# Patient Record
Sex: Male | Born: 1950 | Race: White | Hispanic: No | Marital: Single | Smoking: Never smoker
Health system: Southern US, Community
[De-identification: ages and names within clinical notes are randomized; demographics above are authoritative.]

## PROBLEM LIST (undated history)

## (undated) DIAGNOSIS — M62838 Other muscle spasm: Secondary | ICD-10-CM

## (undated) DIAGNOSIS — G609 Hereditary and idiopathic neuropathy, unspecified: Secondary | ICD-10-CM

## (undated) DIAGNOSIS — F419 Anxiety disorder, unspecified: Secondary | ICD-10-CM

## (undated) DIAGNOSIS — K219 Gastro-esophageal reflux disease without esophagitis: Secondary | ICD-10-CM

## (undated) DIAGNOSIS — B20 Human immunodeficiency virus [HIV] disease: Secondary | ICD-10-CM

## (undated) DIAGNOSIS — D649 Anemia, unspecified: Secondary | ICD-10-CM

## (undated) DIAGNOSIS — F0280 Dementia in other diseases classified elsewhere without behavioral disturbance: Secondary | ICD-10-CM

## (undated) DIAGNOSIS — F32A Depression, unspecified: Secondary | ICD-10-CM

## (undated) DIAGNOSIS — F0390 Unspecified dementia without behavioral disturbance: Secondary | ICD-10-CM

## (undated) DIAGNOSIS — I10 Essential (primary) hypertension: Secondary | ICD-10-CM

## (undated) DIAGNOSIS — F329 Major depressive disorder, single episode, unspecified: Secondary | ICD-10-CM

## (undated) DIAGNOSIS — D696 Thrombocytopenia, unspecified: Secondary | ICD-10-CM

## (undated) DIAGNOSIS — G969 Disorder of central nervous system, unspecified: Secondary | ICD-10-CM

## (undated) DIAGNOSIS — T7840XA Allergy, unspecified, initial encounter: Secondary | ICD-10-CM

## (undated) DIAGNOSIS — E881 Lipodystrophy, not elsewhere classified: Secondary | ICD-10-CM

## (undated) DIAGNOSIS — E785 Hyperlipidemia, unspecified: Secondary | ICD-10-CM

## (undated) DIAGNOSIS — M1990 Unspecified osteoarthritis, unspecified site: Secondary | ICD-10-CM

## (undated) DIAGNOSIS — R634 Abnormal weight loss: Principal | ICD-10-CM

## (undated) DIAGNOSIS — R2689 Other abnormalities of gait and mobility: Secondary | ICD-10-CM

## (undated) DIAGNOSIS — K859 Acute pancreatitis without necrosis or infection, unspecified: Secondary | ICD-10-CM

## (undated) HISTORY — DX: Other abnormalities of gait and mobility: R26.89

## (undated) HISTORY — DX: Hereditary and idiopathic neuropathy, unspecified: G60.9

## (undated) HISTORY — DX: Hyperlipidemia, unspecified: E78.5

## (undated) HISTORY — DX: Human immunodeficiency virus (HIV) disease: B20

## (undated) HISTORY — DX: Allergy, unspecified, initial encounter: T78.40XA

## (undated) HISTORY — DX: Anemia, unspecified: D64.9

## (undated) HISTORY — DX: Major depressive disorder, single episode, unspecified: F32.9

## (undated) HISTORY — DX: Disorder of central nervous system, unspecified: G96.9

## (undated) HISTORY — DX: Anxiety disorder, unspecified: F41.9

## (undated) HISTORY — DX: Thrombocytopenia, unspecified: D69.6

## (undated) HISTORY — DX: Lipodystrophy, not elsewhere classified: E88.1

## (undated) HISTORY — DX: Dementia in other diseases classified elsewhere without behavioral disturbance: F02.80

## (undated) HISTORY — DX: Unspecified dementia, unspecified severity, without behavioral disturbance, psychotic disturbance, mood disturbance, and anxiety: F03.90

## (undated) HISTORY — DX: Abnormal weight loss: R63.4

## (undated) HISTORY — DX: Essential (primary) hypertension: I10

## (undated) HISTORY — DX: Gastro-esophageal reflux disease without esophagitis: K21.9

## (undated) HISTORY — DX: Other muscle spasm: M62.838

## (undated) HISTORY — DX: Depression, unspecified: F32.A

## (undated) HISTORY — DX: Unspecified osteoarthritis, unspecified site: M19.90

## (undated) HISTORY — DX: Acute pancreatitis without necrosis or infection, unspecified: K85.90

---

## 2004-08-21 ENCOUNTER — Ambulatory Visit (HOSPITAL_COMMUNITY): Admission: RE | Admit: 2004-08-21 | Discharge: 2004-08-21 | Payer: Self-pay

## 2010-07-09 ENCOUNTER — Encounter: Payer: Self-pay | Admitting: Infectious Disease

## 2010-07-18 ENCOUNTER — Encounter: Payer: Self-pay | Admitting: Infectious Disease

## 2010-07-26 ENCOUNTER — Encounter: Payer: Self-pay | Admitting: Infectious Disease

## 2010-08-14 ENCOUNTER — Telehealth: Payer: Self-pay | Admitting: Infectious Disease

## 2010-08-26 ENCOUNTER — Ambulatory Visit: Payer: Self-pay | Admitting: Infectious Disease

## 2010-08-26 ENCOUNTER — Telehealth: Payer: Self-pay | Admitting: Infectious Disease

## 2010-08-26 DIAGNOSIS — E291 Testicular hypofunction: Secondary | ICD-10-CM

## 2010-08-26 DIAGNOSIS — R5383 Other fatigue: Secondary | ICD-10-CM

## 2010-08-26 DIAGNOSIS — G609 Hereditary and idiopathic neuropathy, unspecified: Secondary | ICD-10-CM | POA: Insufficient documentation

## 2010-08-26 DIAGNOSIS — F0280 Dementia in other diseases classified elsewhere without behavioral disturbance: Secondary | ICD-10-CM

## 2010-08-26 DIAGNOSIS — N62 Hypertrophy of breast: Secondary | ICD-10-CM | POA: Insufficient documentation

## 2010-08-26 DIAGNOSIS — K8689 Other specified diseases of pancreas: Secondary | ICD-10-CM

## 2010-08-26 DIAGNOSIS — B20 Human immunodeficiency virus [HIV] disease: Secondary | ICD-10-CM

## 2010-08-26 DIAGNOSIS — R5381 Other malaise: Secondary | ICD-10-CM | POA: Insufficient documentation

## 2010-08-26 LAB — CONVERTED CEMR LAB
ALT: 39 units/L (ref 0–53)
AST: 32 units/L (ref 0–37)
Albumin: 4.4 g/dL (ref 3.5–5.2)
Alkaline Phosphatase: 78 units/L (ref 39–117)
BUN: 11 mg/dL (ref 6–23)
Basophils Absolute: 0 10*3/uL (ref 0.0–0.1)
Basophils Relative: 0 % (ref 0–1)
CO2: 27 meq/L (ref 19–32)
Calcium: 9.4 mg/dL (ref 8.4–10.5)
Chlamydia, Swab/Urine, PCR: NEGATIVE
Chloride: 107 meq/L (ref 96–112)
Cholesterol: 147 mg/dL (ref 0–200)
Creatinine, Ser: 1.19 mg/dL (ref 0.40–1.50)
Eosinophils Absolute: 0.1 10*3/uL (ref 0.0–0.7)
Eosinophils Relative: 2 % (ref 0–5)
GC Probe Amp, Urine: NEGATIVE
Glucose, Bld: 90 mg/dL (ref 70–99)
HCT: 42.3 % (ref 39.0–52.0)
HDL: 41 mg/dL (ref >39)
HIV 1 RNA Quant: <20 copies/mL (ref <20)
HIV-1 RNA Quant, Log: <1.3 (ref <1.30)
Hemoglobin: 14.8 g/dL (ref 13.0–17.0)
LDL Cholesterol: 91 mg/dL (ref 0–99)
Lymphocytes Relative: 20 % (ref 12–46)
Lymphs Abs: 1.2 10*3/uL (ref 0.7–4.0)
MCHC: 35 g/dL (ref 30.0–36.0)
MCV: 84.1 fL (ref 78.0–100.0)
Monocytes Absolute: 0.3 10*3/uL (ref 0.1–1.0)
Monocytes Relative: 4 % (ref 3–12)
Neutro Abs: 4.4 10*3/uL (ref 1.7–7.7)
Neutrophils Relative %: 74 % (ref 43–77)
Platelets: 78 10*3/uL — ABNORMAL LOW (ref 150–400)
Potassium: 4.3 meq/L (ref 3.5–5.3)
RBC: 5.03 M/uL (ref 4.22–5.81)
RDW: 14.2 % (ref 11.5–15.5)
Sodium: 142 meq/L (ref 135–145)
TSH: 1.368 microintl units/mL (ref 0.350–4.500)
Testosterone: 660.1 ng/dL (ref 250–890)
Total Bilirubin: 0.7 mg/dL (ref 0.3–1.2)
Total CHOL/HDL Ratio: 3.6
Total Protein: 7 g/dL (ref 6.0–8.3)
Triglycerides: 77 mg/dL (ref <150)
VLDL: 15 mg/dL (ref 0–40)
WBC: 5.9 10*3/uL (ref 4.0–10.5)

## 2010-08-30 ENCOUNTER — Encounter: Payer: Self-pay | Admitting: Infectious Disease

## 2010-09-04 ENCOUNTER — Encounter (INDEPENDENT_AMBULATORY_CARE_PROVIDER_SITE_OTHER): Payer: Self-pay | Admitting: *Deleted

## 2010-09-27 ENCOUNTER — Encounter: Payer: Self-pay | Admitting: Infectious Disease

## 2010-10-22 NOTE — Miscellaneous (Signed)
Summary: HIPAA Restrictions  HIPAA Restrictions   Imported By: Florinda Marker 08/26/2010 14:22:24  _____________________________________________________________________  External Attachment:    Type:   Image     Comment:   External Document

## 2010-10-22 NOTE — Assessment & Plan Note (Signed)
Summary: new pt transferring from [facility]/pre dr Daiva Eves Marilynn Latino   Visit Type:  Consult Referring Provider:  Dr. Hyacinth Meeker   History of Present Illness: 60 year old man with HIV, AIDS diagnosed in the 1990s apparently with NRTI induced pancreatitis, also with hypogonadism, peripheral neuropathy, insominia, fatigue and HIV dementia it appears. He has currently been doing well on nevirapene and truvada and had wished to transfer care from [facility] to [place]. He was followed kby Dr. Hyacinth Meeker at [facility]. I have received large stack of records which [place] reviewed from [place]. Pt feels well apart from his chronic fatigue and malaise. He continues to have diarrhea though well controlled with antidiarheal and claims to take his creon 2 tabs three times a day with  I spent greater than an hour with pt including greater than 50% of time face to face counselling pt and in coordination of care.Dr. Hyacinth Meeker  Problems Prior to Update: None  Medications Prior to Update: 1)  Zaleplon 10 Mg Caps (Zaleplon) .... Take 1 To Two Tablets  By Mouth At Bedtime As Needed Insomnia  Current Medications (verified): 1)  Zaleplon 10 Mg Caps (Zaleplon) .... Take 1 To Two Tablets  By Mouth At Bedtime As Needed Insomnia 2)  Truvada 200-300 Mg Tabs (Emtricitabine-Tenofovir) .... Take 1 Tablet By Mouth Once A Day 3)  Viramune Xr 400 Mg Xr24h-Tab (Nevirapine) .... Take 1 Tablet By Mouth Once A Day 4)  Amitriptyline Hcl 50 Mg Tabs (Amitriptyline Hcl) .... Take 1 Tab By Mouth At Bedtime 5)  Creon 24000 Unit Cpep (Pancrelipase (Lip-Prot-Amyl)) .... Take Two Tablets With Each Meal 6)  Lomotil 2.5-0.025 Mg Tabs (Diphenoxylate-Atropine) .... Up To Two Times A Day As Needed Diarrhea 7)  Prilosec 40 Mg Cpdr (Omeprazole) .... Take 1 Tablet By Mouth Once A Day 8)  Atenolol 50 Mg Tabs (Atenolol) .... Take 1 Tablet By Mouth Once A Day 9)  Promethazine Hcl 25 Mg Tabs (Promethazine Hcl) .... Take 1 Tablet By Mouth Two Times A Day As Needed  Nausea   Past History:  Past Medical History: HIV HIV associated pancreatitis  chronic diarrhea pancreatic insuficiency GERD HTN INsomnia Fatigued Depressed Hepatitis B HIV dementia hypogonadism  Past Surgical History: none  Family History: Reviewed history and no changes required. Mom with heart problems in 26s, congenital heart defect, Dad died in 64s from MI  Social History: Reviewed history and no changes required. disablity, lives with one partner (HIV negative) , no smoker, no alcohol, no drugs  Review of Systems       The patient complains of muscle weakness and depression.  The patient denies anorexia, fever, weight loss, weight gain, vision loss, decreased hearing, hoarseness, chest pain, syncope, dyspnea on exertion, peripheral edema, prolonged cough, headaches, hemoptysis, abdominal pain, melena, hematochezia, severe indigestion/heartburn, hematuria, incontinence, genital sores, suspicious skin lesions, transient blindness, difficulty walking, unusual weight change, abnormal bleeding, and enlarged lymph nodes.    Physical Exam  General:  alert, underweight appearing, and pale.   Head:  normocephalic.  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   Ears:  no external deformities.   Nose:  no external deformity.   Mouth:  pharynx pink and moist, no erythema, and no exudates.   Neck:  supple and full ROM.   Breasts:  gynecomastia.   Lungs:  normal respiratory effort, no crackles, and no wheezes.   Heart:  normal rate, regular rhythm, no murmur, no gallop,  and no rub.   Abdomen:  soft, non-tender, normal bowel sounds, and no distention.   Msk:  normal ROM and no joint swelling.   Neurologic:  alert & oriented X3, strength normal in all extremities, and gait normal.   Skin:  turgor normal and no rashes.   Psych:  normally interactive and good eye contact.     Impression &  Recommendations:  Problem # 1:  AIDS (ICD-042) Change him to long acting viramune with truvada. Check labs today. Update immunizations in our records from [facility] records Orders: T-CBC w/Diff [phone]) Consultation Level V [phone])  Problem # 2:  FATIGUE (ICD-780.79) recheck his testosterone level and tfts Orders: T-Testosterone; Total [id number]) T-TSH [id number]) Consultation Level V [id number])  Problem # 3:  PERIPHERAL NEUROPATHY (ICD-356.9)  stable  Orders: Consultation Level V (44010)  Problem # 4:  HYPOGONADISM (ICD-257.2)  recheck testosterone. Not clear to me why he had this stopped  Orders: Consultation Level V (27253)  Problem # 5:  PANCREATIC INSUFFICIENCY (ICD-577.8)  filled his creon  Orders: Consultation Level V (66440)  Medications Added to Medication List This Visit: 1)  Truvada 200-300 Mg Tabs (Emtricitabine-tenofovir) .... Take 1 tablet by mouth once a day 2)  Viramune Xr 400 Mg Xr24h-tab (Nevirapine) .... Take 1 tablet by mouth once a day 3)  Amitriptyline Hcl 50 Mg Tabs (Amitriptyline hcl) .... Take 1 tab by mouth at bedtime 4)  Creon 24000 Unit Cpep (Pancrelipase (lip-prot-amyl)) .... Take two tablets with each meal 5)  Lomotil 2.5-0.025 Mg Tabs (Diphenoxylate-atropine) .... Up to two times a day as needed diarrhea 6)  Prilosec 40 Mg Cpdr (Omeprazole) .... Take 1 tablet by mouth once a day 7)  Atenolol 50 Mg Tabs (Atenolol) .... Take 1 tablet by mouth once a day 8)  Promethazine Hcl 25 Mg Tabs (Promethazine hcl) .... Take 1 tablet by mouth two times a day as needed nausea  Other Orders: T-RPR (Syphilis) [id number]) T-GC Probe, urine [id number]) T-Chlamydia  Probe, urine [phone]) T-CD4SP [facility]) (CD4SP) T-HIV Viral Load [id number]) T-RPR (Syphilis) [id number]) T-GC Probe, urine [id number]) T-Chlamydia  Probe, urine [id number]) T-Comprehensive Metabolic Panel [id number]) T-Lipid Profile ([id number]) Future  Orders: T-CD4SP ([facility]) (CD4SP) ... 11/24/2010 T-HIV Viral Load [id number]) ... 11/24/2010 T-CBC w/Diff ([id number]) ... 11/24/2010 T-Comprehensive Metabolic Panel [id number]) ... 11/24/2010       Medication Adherence: 08/26/2010   Adherence to medications reviewed with patient. Counseling to provide adequate adherence provided   Prevention For Positives: 08/26/2010   Safe sex practices discussed with patient. Condoms offered.   Education Materials Provided: 08/26/2010 Safe sex practices discussed with patient. Condoms offered.                            Patient Instructions: 1)  rtc for lab  check in 2-3 months for labs and  appt with Dr. Daiva Eves 2)  Be sure to return for lab work one (1) week before your next appointment as scheduled. Prescriptions: ATENOLOL 50 MG TABS (ATENOLOL) Take 1 tablet by mouth once a day  #30 x 11   Entered by:   Starleen Arms CMA   Authorized by:   Acey Lav MD   Signed by:   Starleen Arms CMA on 08/26/2010   Method used:   Printed then faxed to ...       [facility] [phone]* (retail)       [address]       [place], [place]  [address]       Ph: [phone]       Fax:    RxID:   [id number] PRILOSEC 40 MG CPDR (OMEPRAZOLE) Take 1 tablet by mouth once a day  #30 x 11   Entered by:   Starleen Arms CMA   Authorized by:   Acey Lav MD   Signed by:   Starleen Arms CMA on 08/26/2010   Method used:   Printed then faxed to ...       [facility] [phone]* (retail)       [address]       [place], [place]  [address]       Ph: [phone]       Fax:    RxID:   [id number] [phone] LOMOTIL 2.5-0.025 MG TABS (DIPHENOXYLATE-ATROPINE) up to two times a day as needed diarrhea  #60 x 11   Entered by:   Starleen Arms CMA   Authorized by:   Acey Lav MD   Signed by:   Starleen Arms CMA on 08/26/2010   Method used:   Printed then faxed to ...       [facility] [phone]* (retail)       [address]        [place], [place]  [address]       Ph: [phone]       Fax:    RxID:   [phone] CREON 24000 UNIT CPEP (PANCRELIPASE (LIP-PROT-AMYL)) take two tablets with each meal  #60 x 11   Entered by:   Starleen Arms CMA   Authorized by:   Acey Lav MD   Signed by:   Starleen Arms CMA on 08/26/2010   Method used:   Printed then faxed to ...       [facility] [phone]* (retail)       [address]       [place], [place]  [address]       Ph: [phone]       Fax:    RxID:   [id number] AMITRIPTYLINE HCL 50 MG TABS (AMITRIPTYLINE HCL) Take 1 tab by mouth at bedtime  #30 x 11   Entered by:   Starleen Arms CMA   Authorized by:   Acey Lav MD   Signed by:   Starleen Arms CMA on 08/26/2010   Method used:   Printed then faxed to ...       [facility] [phone]* (retail)       [address]       [place], [place]  [address]       Ph: [phone]       Fax:    RxID:   [id number] PROMETHAZINE HCL 25 MG TABS (PROMETHAZINE HCL) Take 1 tablet by mouth two times a day as needed nausea  #60 x 3   Entered by:   Starleen Arms CMA   Authorized by:   Acey Lav MD   Signed by:   Starleen Arms CMA on 08/26/2010   Method used:   Printed then faxed to ...       [facility] [phone]* (retail)       [address]       [place], [place]  [address]       Ph: [phone]       Fax:    RxID:   [id number] VIRAMUNE XR 400 MG XR24H-TAB (NEVIRAPINE) Take 1 tablet by mouth once a day  #30 x 11   Entered by:   Starleen Arms CMA  Authorized by:   Acey Lav MD   Signed by:   Starleen Arms CMA on 08/26/2010   Method used:   Printed then faxed to ...       [facility] [phone]* (retail)       [address]       [place], [place]  [address]       Ph: [phone]       Fax:    RxID:   [id number] TRUVADA 200-300 MG TABS (EMTRICITABINE-TENOFOVIR) Take 1 tablet by mouth once a day  #30 x 11   Entered by:   Starleen Arms CMA   Authorized by:   Acey Lav MD   Signed  by:   Starleen Arms CMA on 08/26/2010   Method used:   Printed then faxed to ...       [facility] [phone]* (retail)       [address]       [place], [place]  [address]       Ph: [phone]       Fax:    RxID:   [phone] ZALEPLON 10 MG CAPS (ZALEPLON) Take 1 to two tablets  by mouth at bedtime as needed insomnia  #60 x 6   Entered by:   Starleen Arms CMA   Authorized by:   Acey Lav MD   Signed by:   Starleen Arms CMA on 08/26/2010   Method used:   Telephoned to ...       [facility] [address] [phone]* (retail)       [address] [place]  [address]       Ph: [phone]       Fax: [phone]   RxID:   [id number]

## 2010-10-22 NOTE — Consult Note (Signed)
Summary: [facility]: ID 09/19/98-2004  [facility]: ID 09/19/98-2004   Imported By: Florinda Marker 08/30/2010 14:12:19  _____________________________________________________________________  External Attachment:    Type:   Image     Comment:   External Document

## 2010-10-22 NOTE — Progress Notes (Signed)
  Phone Note Call from Patient   Caller: Patient    New/Updated Medications: ZALEPLON 10 MG CAPS (ZALEPLON) Take 1 to two tablets  by mouth at bedtime as needed insomnia Prescriptions: ZALEPLON 10 MG CAPS (ZALEPLON) Take 1 to two tablets  by mouth at bedtime as needed insomnia  #60 x 0   Entered and Authorized by:   Acey Lav MD   Signed by:   Paulette Blanch Dam MD on 08/14/2010   Method used:   Printed then faxed to ...       [facility] [address] [phone]* (retail)       [address] [place], [place]  [address]       Ph: [phone]       Fax: [phone]   RxID:   [id number]

## 2010-10-22 NOTE — Consult Note (Signed)
Summary: [facility]: 08/27/2007  [facility]: 08/27/2007   Imported By: Florinda Marker 07/26/2010 11:34:44  _____________________________________________________________________  External Attachment:    Type:   Image     Comment:   External Document

## 2010-10-22 NOTE — Consult Note (Signed)
Summary: [facility]: ID Notes 4/11 to 11/10  [facility]: ID Notes 4/11 to 11/10   Imported By: Florinda Marker 07/18/2010 11:53:11  _____________________________________________________________________  External Attachment:    Type:   Image     Comment:   External Document

## 2010-10-24 NOTE — Progress Notes (Signed)
Summary: Questions re: Creon  Phone Note From Pharmacy   Caller: [facility]  Summary of Call: Pharmacy called about quantitiy of Creon. It was sent as #60. The directions suggest # 180 for 30 day supply.   Follow-up for Phone Call        I will send corrected dose to pharmacy . Tomasita Morrow RN  August 26, 2010 3:54 PM  Follow-up by: Tomasita Morrow RN,  August 26, 2010 3:55 PM  Additional Follow-up for Phone Call Additional follow up Details #1::        T hanks Tammy Additional Follow-up by: Acey Lav MD,  August 27, 2010 8:16 AM    Prescriptions: CREON 24000 UNIT CPEP (PANCRELIPASE (LIP-PROT-AMYL)) take two tablets with each meal  #180 x 11   Entered by:   Tomasita Morrow RN   Authorized by:   Acey Lav MD   Signed by:   Tomasita Morrow RN on 08/26/2010   Method used:   Electronically to        [facility] [phone]* (retail)       [address]       [place], [place]  [address]       Ph: [phone]       Fax:    RxID:   [id number]

## 2010-10-24 NOTE — Miscellaneous (Signed)
Summary: RW Update  Clinical Lists Changes  Observations: Added new observation of RWPARTICIP: Yes (09/27/2010 10:08)

## 2010-10-24 NOTE — Miscellaneous (Signed)
Summary: problem list update  Clinical Lists Changes  Problems: Added new problem of SCREENING EXAMINATION FOR VENEREAL DISEASE (ICD-V74.5)

## 2010-10-30 ENCOUNTER — Encounter (INDEPENDENT_AMBULATORY_CARE_PROVIDER_SITE_OTHER): Payer: Self-pay | Admitting: *Deleted

## 2010-10-31 ENCOUNTER — Encounter (INDEPENDENT_AMBULATORY_CARE_PROVIDER_SITE_OTHER): Payer: Self-pay | Admitting: *Deleted

## 2010-11-06 ENCOUNTER — Encounter (INDEPENDENT_AMBULATORY_CARE_PROVIDER_SITE_OTHER): Payer: Self-pay | Admitting: *Deleted

## 2010-11-07 NOTE — Miscellaneous (Signed)
  Clinical Lists Changes 

## 2010-11-07 NOTE — Miscellaneous (Signed)
  Clinical Lists Changes  Observations: Added new observation of INFECTDIS MD: [place] (10/30/2010 9:41) 

## 2010-11-07 NOTE — Miscellaneous (Signed)
  Clinical Lists Changes  Observations: Added new observation of PAYOR: Private (10/31/2010 11:50) Added new observation of LATINO/HISP: No (10/31/2010 11:50) Added new observation of RACE: White (10/31/2010 11:50)

## 2010-11-11 ENCOUNTER — Other Ambulatory Visit: Payer: Self-pay

## 2010-11-12 ENCOUNTER — Other Ambulatory Visit (INDEPENDENT_AMBULATORY_CARE_PROVIDER_SITE_OTHER): Payer: Medicare Other

## 2010-11-12 ENCOUNTER — Other Ambulatory Visit: Payer: Self-pay | Admitting: Infectious Disease

## 2010-11-12 ENCOUNTER — Encounter: Payer: Self-pay | Admitting: Infectious Disease

## 2010-11-12 DIAGNOSIS — B20 Human immunodeficiency virus [HIV] disease: Secondary | ICD-10-CM

## 2010-11-12 LAB — CONVERTED CEMR LAB
AST: 23 units/L (ref 0–37)
Albumin: 4.7 g/dL (ref 3.5–5.2)
Alkaline Phosphatase: 75 units/L (ref 39–117)
BUN: 10 mg/dL (ref 6–23)
Basophils Absolute: 0 10*3/uL (ref 0.0–0.1)
Basophils Relative: 0 % (ref 0–1)
CO2: 26 meq/L (ref 19–32)
Calcium: 9.4 mg/dL (ref 8.4–10.5)
Chloride: 105 meq/L (ref 96–112)
Creatinine, Ser: 1.03 mg/dL (ref 0.40–1.50)
Eosinophils Absolute: 0.1 10*3/uL (ref 0.0–0.7)
Eosinophils Relative: 2 % (ref 0–5)
Glucose, Bld: 107 mg/dL — ABNORMAL HIGH (ref 70–99)
HCT: 41.8 % (ref 39.0–52.0)
HIV 1 RNA Quant: <20 copies/mL (ref <20)
Hemoglobin: 15.3 g/dL (ref 13.0–17.0)
Lymphs Abs: 1 10*3/uL (ref 0.7–4.0)
MCHC: 36.6 g/dL — ABNORMAL HIGH (ref 30.0–36.0)
MCV: 84.1 fL (ref 78.0–100.0)
Monocytes Absolute: 0.3 10*3/uL (ref 0.1–1.0)
Monocytes Relative: 6 % (ref 3–12)
Neutro Abs: 3.3 10*3/uL (ref 1.7–7.7)
Platelets: 63 10*3/uL — ABNORMAL LOW (ref 150–400)
Potassium: 3.9 meq/L (ref 3.5–5.3)
RBC: 4.97 M/uL (ref 4.22–5.81)
RDW: 13.5 % (ref 11.5–15.5)
Sodium: 139 meq/L (ref 135–145)
Total Bilirubin: 0.7 mg/dL (ref 0.3–1.2)
Total Protein: 7.3 g/dL (ref 6.0–8.3)
WBC: 4.7 10*3/uL (ref 4.0–10.5)

## 2010-11-13 LAB — T-HELPER CELL (CD4) - (RCID CLINIC ONLY)
CD4 % Helper T Cell: 29 % — ABNORMAL LOW (ref 33–55)
CD4 T Cell Abs: 320 uL — ABNORMAL LOW (ref 400–2700)

## 2010-11-13 NOTE — Miscellaneous (Signed)
Summary: RW update  Clinical Lists Changes  Observations: Added new observation of GENDER: Male (11/06/2010 14:32)

## 2010-11-14 ENCOUNTER — Telehealth: Payer: Self-pay | Admitting: Infectious Disease

## 2010-11-20 ENCOUNTER — Encounter (INDEPENDENT_AMBULATORY_CARE_PROVIDER_SITE_OTHER): Payer: Self-pay | Admitting: *Deleted

## 2010-11-21 ENCOUNTER — Encounter (INDEPENDENT_AMBULATORY_CARE_PROVIDER_SITE_OTHER): Payer: Self-pay | Admitting: *Deleted

## 2010-11-26 ENCOUNTER — Telehealth: Payer: Self-pay | Admitting: *Deleted

## 2010-11-26 ENCOUNTER — Encounter (INDEPENDENT_AMBULATORY_CARE_PROVIDER_SITE_OTHER): Payer: Self-pay | Admitting: *Deleted

## 2010-11-28 NOTE — Progress Notes (Addendum)
Summary: Patient needs flu shot and pneumovax updated  Phone Note Outgoing Call   Call placed by: Acey Lav MD,  November 14, 2010 4:18 PM Summary of Call: Per our records patient needs flu and pneumovax. He likely had pneumovax at [facility]. We just need to document date on that one. For flu  Please call and have come in for flu shot and if pt refuses flu shot  please document in chart Initial call taken by: Acey Lav MD,  November 14, 2010 4:18 PM  Follow-up for Phone Call        left message for patient to call me back Follow-up by: Starleen Arms CMA,  November 15, 2010 9:53 AM  Additional Follow-up for Phone Call Additional follow up Details #1::        states he got the flu shot at [facility] on 07/13/10 Additional Follow-up by: Golden Circle RN,  November 15, 2010 12:26 PM      Immunization History:  Influenza Immunization History:    Influenza:  historical (07/13/2010)  Pneumonia Vaccine (to be given today)

## 2010-11-28 NOTE — Miscellaneous (Signed)
  Clinical Lists Changes  Observations: Added new observation of AIDSDAP: PENDING 2012 APPROVAL (11/20/2010 16:07) Added new observation of PCTFPL: 84696.29  (11/20/2010 16:07) Added new observation of INCOMESOURCE: SSI  (11/20/2010 16:07) Added new observation of HOUSEINCOME: [id number]  (11/20/2010 16:07) Added new observation of #CHILD<18 IN: No  (11/20/2010 16:07) Added new observation of FAMILYSIZE: 1  (11/20/2010 16:07) Added new observation of HOUSING: Stable/permanent  (11/20/2010 16:07) Added new observation of FINASSESSDT: 11/20/2010  (11/20/2010 16:07) Added new observation of MARITAL STAT: Single  (11/20/2010 16:07) Added new observation of PATNTCOUNTY: [place]  (11/20/2010 16:07)

## 2010-11-28 NOTE — Miscellaneous (Signed)
Summary: new rxes needed for ADAP  Clinical Lists Changes  Medications: Rx of TRUVADA 200-300 MG TABS (EMTRICITABINE-TENOFOVIR) Take 1 tablet by mouth once a day;  #30 x 11;  Signed;  Entered by: Jennet Maduro RN;  Authorized by: Paulette Blanch Dam MD;  Method used: Print then Give to Patient Rx of VIRAMUNE XR 400 MG XR24H-TAB (NEVIRAPINE) Take 1 tablet by mouth once a day;  #30 x 11;  Signed;  Entered by: Jennet Maduro RN;  Authorized by: Paulette Blanch Dam MD;  Method used: Print then Give to Patient Rx of LOMOTIL 2.5-0.025 MG TABS (DIPHENOXYLATE-ATROPINE) up to two times a day as needed diarrhea;  #60 x 11;  Signed;  Entered by: Jennet Maduro RN;  Authorized by: Paulette Blanch Dam MD;  Method used: Print then Give to Patient Rx of PRILOSEC 40 MG CPDR (OMEPRAZOLE) Take 1 tablet by mouth once a day;  #30 x 11;  Signed;  Entered by: Jennet Maduro RN;  Authorized by: Paulette Blanch Dam MD;  Method used: Print then Give to Patient Rx of PROMETHAZINE HCL 25 MG TABS (PROMETHAZINE HCL) Take 1 tablet by mouth two times a day as needed nausea;  #60 x 3;  Signed;  Entered by: Jennet Maduro RN;  Authorized by: Paulette Blanch Dam MD;  Method used: Print then Give to Patient    Prescriptions: PROMETHAZINE HCL 25 MG TABS (PROMETHAZINE HCL) Take 1 tablet by mouth two times a day as needed nausea  #60 x 3   Entered by:   Jennet Maduro RN   Authorized by:   Acey Lav MD   Signed by:   Jennet Maduro RN on 11/21/2010   Method used:   Print then Give to Patient   RxID:   [id number] PRILOSEC 40 MG CPDR (OMEPRAZOLE) Take 1 tablet by mouth once a day  #30 x 11   Entered by:   Jennet Maduro RN   Authorized by:   Acey Lav MD   Signed by:   Jennet Maduro RN on 11/21/2010   Method used:   Print then Give to Patient   RxID:   [id number] LOMOTIL 2.5-0.025 MG TABS (DIPHENOXYLATE-ATROPINE) up to two times a day as needed diarrhea  #60 x 11   Entered by:   Jennet Maduro RN  Authorized by:   Acey Lav MD   Signed by:   Jennet Maduro RN on 11/21/2010   Method used:   Print then Give to Patient   RxID:   [id number] VIRAMUNE XR 400 MG XR24H-TAB (NEVIRAPINE) Take 1 tablet by mouth once a day  #30 x 11   Entered by:   Jennet Maduro RN   Authorized by:   Acey Lav MD   Signed by:   Jennet Maduro RN on 11/21/2010   Method used:   Print then Give to Patient   RxID:   [id number] TRUVADA 200-300 MG TABS (EMTRICITABINE-TENOFOVIR) Take 1 tablet by mouth once a day  #30 x 11   Entered by:   Jennet Maduro RN   Authorized by:   Acey Lav MD   Signed by:   Jennet Maduro RN on 11/21/2010   Method used:   Print then Give to Patient   RxID:   [id number]

## 2010-12-02 ENCOUNTER — Ambulatory Visit (INDEPENDENT_AMBULATORY_CARE_PROVIDER_SITE_OTHER): Payer: Medicare Other | Admitting: Infectious Disease

## 2010-12-02 ENCOUNTER — Encounter: Payer: Self-pay | Admitting: Infectious Disease

## 2010-12-02 DIAGNOSIS — R7309 Other abnormal glucose: Secondary | ICD-10-CM | POA: Insufficient documentation

## 2010-12-02 DIAGNOSIS — E291 Testicular hypofunction: Secondary | ICD-10-CM

## 2010-12-02 DIAGNOSIS — E881 Lipodystrophy, not elsewhere classified: Secondary | ICD-10-CM | POA: Insufficient documentation

## 2010-12-02 DIAGNOSIS — F0280 Dementia in other diseases classified elsewhere without behavioral disturbance: Secondary | ICD-10-CM

## 2010-12-02 DIAGNOSIS — K8689 Other specified diseases of pancreas: Secondary | ICD-10-CM

## 2010-12-02 DIAGNOSIS — B20 Human immunodeficiency virus [HIV] disease: Secondary | ICD-10-CM

## 2010-12-03 LAB — T-HELPER CELL (CD4) - (RCID CLINIC ONLY)
CD4 % Helper T Cell: 28 % — ABNORMAL LOW (ref 33–55)
CD4 T Cell Abs: 320 uL — ABNORMAL LOW (ref 400–2700)

## 2010-12-03 NOTE — Progress Notes (Signed)
  Phone Note Call from Patient   Caller: Patient Reason for Call: Lab or Test Results Summary of Call: he LM that he wanted his lab results. I lm that he will get them at his appt here 12/02/10 Initial call taken by: Golden Circle RN,  November 26, 2010 9:48 AM

## 2010-12-03 NOTE — Miscellaneous (Signed)
  Clinical Lists Changes  Observations: Added new observation of PCTFPL: 129.53  (11/26/2010 10:28) Added new observation of HOUSEINCOME: 21308  (11/26/2010 10:28)

## 2010-12-05 ENCOUNTER — Encounter (INDEPENDENT_AMBULATORY_CARE_PROVIDER_SITE_OTHER): Payer: Self-pay | Admitting: *Deleted

## 2010-12-10 NOTE — Miscellaneous (Signed)
Summary: ADAP APPROVED TIL 06/22/11  Clinical Lists Changes  Observations: Added new observation of AIDSDAP: Yes 2012 (12/05/2010 17:56)

## 2010-12-10 NOTE — Assessment & Plan Note (Signed)
Summary: 47month f/u   Vital Signs:  Patient profile:   60 year old male Height:      65 inches (165.10 cm) Weight:      153.75 pounds (69.89 kg) BMI:     25.68 Temp:     98.1 degrees F (36.72 degrees C) oral Pulse rate:   67 / minute BP sitting:   133 / 85  (left arm)  Vitals Entered By: Starleen Arms CMA (December 02, 2010 10:13 AM) CC: f/u Is Patient Diabetic? No Pain Assessment Patient in pain? no      Nutritional Status BMI of 25 - 29 = overweight Nutritional Status Detail ok  Does patient need assistance? Functional Status Self care Ambulation Normal   Visit Type:  Follow-up Referring Provider:  Dr. Hyacinth Meeker Primary Provider:  Lisette Grinder. [employer]  CC:  f/u.  History of Present Illness: 60 year old man with HIV, AIDS diagnosed in the 1990s apparently with NRTI induced pancreatitis, also with hx of  hypogonadism, peripheral neuropathy, insominia, fatigue and HIV dementia. WHen I checked his testosterone off of replacement at last visit the levels were normal. He continues to complain of abdominal fat deposition and loss of fat in his arms and legs. He otherwise feels relatively well. He states that he infrequently has sex with other men, and discloses his status and uses condoms. I spent greater than 45 minutes with him today including greater than 45 minutes in counselling and coordination of care.    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 Take 1 To Two Tablets  By Mouth At Bedtime As Needed Insomnia 2)  Truvada 200-300 Mg Tabs (Emtricitabine-Tenofovir) .... Take 1 Tablet By Mouth Once A Day 3)  Viramune Xr 400 Mg  Xr24h-Tab (Nevirapine) .... Take 1 Tablet By Mouth Once A Day 4)  Amitriptyline Hcl 50 Mg Tabs (Amitriptyline Hcl) .... Take 1 Tab By Mouth At Bedtime 5)  Creon 24000 Unit Cpep (Pancrelipase (Lip-Prot-Amyl)) .... Take Two Tablets With Each Meal 6)  Lomotil 2.5-0.025 Mg Tabs (Diphenoxylate-Atropine) .... Up To Two Times A Day As Needed Diarrhea 7)  Prilosec 40 Mg Cpdr (Omeprazole) .... Take 1 Tablet By Mouth Once A Day 8)  Atenolol 50 Mg Tabs (Atenolol) .... Take 1 Tablet By Mouth Once A Day 9)  Promethazine Hcl 25 Mg Tabs (Promethazine Hcl) .... Take 1 Tablet By Mouth Two Times A Day As Needed Nausea  Current Medications (verified): 1)  Zaleplon 10 Mg Caps (Zaleplon) .... Take 1 To Two Tablets  By Mouth At Bedtime As Needed Insomnia 2)  Truvada 200-300 Mg Tabs (Emtricitabine-Tenofovir) .... Take 1 Tablet By Mouth Once A Day 3)  Viramune Xr 400 Mg Xr24h-Tab (Nevirapine) .... Take 1 Tablet By Mouth Once A Day 4)  Amitriptyline Hcl 50 Mg Tabs (Amitriptyline Hcl) .... Take 1 Tab By Mouth At Bedtime 5)  Creon 24000 Unit Cpep (Pancrelipase (Lip-Prot-Amyl)) .... Take Two Tablets With Each Meal 6)  Lomotil 2.5-0.025 Mg Tabs (Diphenoxylate-Atropine) .... Up To Two Times A Day As Needed Diarrhea 7)  Prilosec 40 Mg Cpdr (Omeprazole) .... Take 1 Tablet By Mouth Once A Day 8)  Atenolol 50 Mg  Tabs (Atenolol) .... Take 1 Tablet By Mouth Once A Day 9)  Promethazine Hcl 25 Mg Tabs (Promethazine Hcl) .... Take 1 Tablet By Mouth Two Times A Day As Needed Nausea  Allergies (verified): No Known Drug Allergies  Family History: Reviewed history from 08/26/2010 and no changes required. Mom with heart problems in 60s, congenital heart defect, Dad died in 85s from MI  Social History: Reviewed history from 08/26/2010 and no changes required. disablity, lives with one partner (HIV negative) , no smoker, no alcohol, no drugs  Review of Systems       see HPI otherwise negative on 12 point review  Physical  Exam  General:  alert, underweight appearing, and pale.   Head:  normocephalic.  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   Ears:  no external deformities.   Nose:  no external deformity.   Mouth:  pharynx pink and moist, no erythema, and no exudates.   Neck:  supple and full ROM.   Lungs:  normal respiratory effort, no crackles, and no wheezes.   Heart:  normal rate, regular rhythm, no murmur, no gallop, and no rub.   Abdomen:  soft, non-tender, normal bowel sounds, and no distention.   Msk:  normal ROM and no joint swelling.   Neurologic:  alert & oriented X3, strength normal in all extremities, and gait normal.   Skin:  turgor normal and no rashes.   Psych:  normally interactive and good eye contact.          Medication Adherence: 12/02/2010   Adherence to medications reviewed with patient. Counseling to provide adequate adherence provided   Prevention For Positives: 12/02/2010   Safe sex practices discussed with patient. Condoms offered.   Education Materials Provided: 12/02/2010 Safe sex practices discussed with patient. Condoms offered.                          Impression & Recommendations:  Problem # 1:  AIDS (ICD-042)  perfect control  Diagnostics Reviewed:  HIV: CDC-defined AIDS (08/26/2010)   CD4: 320 (11/13/2010)   WBC: 4.7 (11/12/2010)   Hgb: 15.3 (11/12/2010)   HCT: 41.8 (11/12/2010)   Platelets: 63 (11/12/2010) HIV-1 RNA: <20 copies/mL (11/12/2010)     Orders: Est. Patient Level V (40981)  Problem # 2:  HYPOGONADISM (ICD-257.2) testosterone normal at last visit, recheck at next one Orders: Est. Patient Level V (99215)Future Orders: T-Testosterone; Total [id number]) ... 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 His testosterone is normal. I will see if he might be able to qualify for growth hormone to help with this.  Orders: Est. Patient Level V (65784)  Problem # 5:  OTHER ABNORMAL GLUCOSE (ICD-790.29)  Orders: T-Hgb A1C (in-house) (69629BM) Est. Patient Level V (84132)  Other Orders: T-Hepatitis A Antibody (44010-27253) T-Hepatitis B Surface Antigen (66440-34742) T-Hepatitis B Surface Antibody (59563-87564) T-Hepatitis C Antibody (33295-18841) Future Orders: T-CD4SP ([facility]) (CD4SP) ... 05/31/2011 T-HIV Viral Load [id number]) ... 05/31/2011 T-CBC w/Diff (09323-55732) ... 05/31/2011 T-RPR (Syphilis) [id number]) ... 05/31/2011 T-Lipid Profile [id number]) ... 05/31/2011 T-Comprehensive Metabolic Panel [phone]) ... 05/31/2011  Patient Instructions: 1)  RTC in 4 months time 2)  we need to check a hemoglobin a 1c 3)  i  will look into possibility of growth hormone    Orders Added: 1)  T-Hgb A1C (in-house) [83036QW] 2)  T-Hepatitis A Antibody [16109-60454] 3)  T-Hepatitis B Surface Antigen [09811-91478] 4)  T-Hepatitis B Surface Antibody [29562-13086] 5)  T-Hepatitis C Antibody [57846-96295] 6)  T-CD4SP [facility]) [CD4SP] 7)  T-HIV Viral Load [id number] 8)  T-CBC w/Diff [02725-36644] 9)  T-RPR (Syphilis) [03474-25956] 10)  T-Lipid Profile [80061-22930] 11)  T-Comprehensive Metabolic Panel [80053-22900] 12)  T-Testosterone; Total [phone] 13)  Est. Patient Level V [51884]   Immunization History:  Pneumovax Immunization History:    Pneumovax:  historical (07/03/2008)   Immunization History:  Pneumovax Immunization History:    Pneumovax:  Historical (07/03/2008)  Appended Document: 57month f/u  Laboratory Results   Blood Tests   Date/Time Received: Mariea Clonts  December 02, 2010 3:06 PM  Date/Time Reported: Mariea Clonts  December 02, 2010 3:06 PM   HGBA1C: 5.1%   (Normal Range: Non-Diabetic - 3-6%   Control Diabetic - 6-8%)

## 2010-12-31 ENCOUNTER — Other Ambulatory Visit: Payer: Self-pay | Admitting: Licensed Clinical Social Worker

## 2010-12-31 ENCOUNTER — Encounter: Payer: Self-pay | Admitting: Licensed Clinical Social Worker

## 2010-12-31 DIAGNOSIS — R110 Nausea: Secondary | ICD-10-CM

## 2011-01-01 MED ORDER — PROMETHAZINE HCL 25 MG PO TABS: 25.0000 mg | ORAL_TABLET | Freq: Two times a day (BID) | ORAL | Status: DC

## 2011-02-18 ENCOUNTER — Other Ambulatory Visit: Payer: Self-pay | Admitting: *Deleted

## 2011-02-18 DIAGNOSIS — G479 Sleep disorder, unspecified: Secondary | ICD-10-CM

## 2011-02-18 MED ORDER — ZALEPLON 10 MG PO CAPS: ORAL_CAPSULE | ORAL | Status: DC

## 2011-02-19 ENCOUNTER — Other Ambulatory Visit: Payer: Self-pay | Admitting: *Deleted

## 2011-03-05 ENCOUNTER — Other Ambulatory Visit: Payer: Self-pay | Admitting: Licensed Clinical Social Worker

## 2011-03-05 ENCOUNTER — Other Ambulatory Visit (INDEPENDENT_AMBULATORY_CARE_PROVIDER_SITE_OTHER): Payer: Medicare Other

## 2011-03-05 DIAGNOSIS — B20 Human immunodeficiency virus [HIV] disease: Secondary | ICD-10-CM

## 2011-03-05 DIAGNOSIS — Z1159 Encounter for screening for other viral diseases: Secondary | ICD-10-CM

## 2011-03-05 DIAGNOSIS — R197 Diarrhea, unspecified: Secondary | ICD-10-CM

## 2011-03-05 DIAGNOSIS — Z79899 Other long term (current) drug therapy: Secondary | ICD-10-CM

## 2011-03-05 DIAGNOSIS — Z113 Encounter for screening for infections with a predominantly sexual mode of transmission: Secondary | ICD-10-CM

## 2011-03-05 LAB — CBC WITH DIFFERENTIAL/PLATELET
Basophils Absolute: 0 10*3/uL (ref 0.0–0.1)
Basophils Relative: 1 % (ref 0–1)
HCT: 42.4 % (ref 39.0–52.0)
Hemoglobin: 15.1 g/dL (ref 13.0–17.0)
Lymphocytes Relative: 23 % (ref 12–46)
Lymphs Abs: 1 10*3/uL (ref 0.7–4.0)
MCH: 30.4 pg (ref 26.0–34.0)
Monocytes Absolute: 0.3 10*3/uL (ref 0.1–1.0)
Monocytes Relative: 7 % (ref 3–12)
Neutro Abs: 2.8 10*3/uL (ref 1.7–7.7)
Neutrophils Relative %: 67 % (ref 43–77)
Platelets: 64 10*3/uL — ABNORMAL LOW (ref 150–400)
RBC: 4.97 MIL/uL (ref 4.22–5.81)
RDW: 14.7 % (ref 11.5–15.5)
WBC: 4.2 10*3/uL (ref 4.0–10.5)

## 2011-03-05 LAB — LIPID PANEL
Cholesterol: 162 mg/dL (ref 0–200)
HDL: 41 mg/dL (ref >39)
LDL Cholesterol: 102 mg/dL — ABNORMAL HIGH (ref 0–99)
Triglycerides: 95 mg/dL (ref <150)

## 2011-03-05 LAB — RPR

## 2011-03-05 LAB — HEPATITIS C ANTIBODY: HCV Ab: NEGATIVE

## 2011-03-05 LAB — TESTOSTERONE: Testosterone: 446.94 ng/dL (ref 250–890)

## 2011-03-05 MED ORDER — DIPHENOXYLATE-ATROPINE 2.5-0.025 MG PO TABS: 1.0000 | ORAL_TABLET | Freq: Two times a day (BID) | ORAL | Status: DC | PRN

## 2011-03-06 LAB — COMPLETE METABOLIC PANEL WITH GFR
ALT: 17 U/L (ref 0–53)
AST: 22 U/L (ref 0–37)
Albumin: 4.6 g/dL (ref 3.5–5.2)
Alkaline Phosphatase: 64 U/L (ref 39–117)
BUN: 12 mg/dL (ref 6–23)
CO2: 24 mEq/L (ref 19–32)
Calcium: 9.2 mg/dL (ref 8.4–10.5)
Chloride: 106 mEq/L (ref 96–112)
Creat: 1.05 mg/dL (ref 0.50–1.35)
GFR, Est African American: >60 mL/min (ref >60)
GFR, Est Non African American: >60 mL/min (ref >60)
Glucose, Bld: 95 mg/dL (ref 70–99)
Potassium: 4.1 mEq/L (ref 3.5–5.3)
Total Bilirubin: 0.9 mg/dL (ref 0.3–1.2)

## 2011-03-06 LAB — HEPATITIS A ANTIBODY, IGM: Hep A IgM: NEGATIVE

## 2011-03-06 LAB — HEPATITIS B SURFACE ANTIGEN: Hepatitis B Surface Ag: NEGATIVE

## 2011-03-06 LAB — HIV-1 RNA QUANT-NO REFLEX-BLD: HIV-1 RNA Quant, Log: 1.32 {Log} — ABNORMAL HIGH (ref <1.30)

## 2011-03-06 LAB — T-HELPER CELL (CD4) - (RCID CLINIC ONLY): CD4 T Cell Abs: 320 uL — ABNORMAL LOW (ref 400–2700)

## 2011-03-19 ENCOUNTER — Encounter: Payer: Self-pay | Admitting: Infectious Disease

## 2011-03-19 ENCOUNTER — Ambulatory Visit (INDEPENDENT_AMBULATORY_CARE_PROVIDER_SITE_OTHER): Payer: Medicare Other | Admitting: Infectious Disease

## 2011-03-19 VITALS — BP 131/76 | HR 76 | Temp 98.4°F | Ht 68.0 in | Wt 154.0 lb

## 2011-03-19 DIAGNOSIS — B20 Human immunodeficiency virus [HIV] disease: Secondary | ICD-10-CM | POA: Insufficient documentation

## 2011-03-19 DIAGNOSIS — E291 Testicular hypofunction: Secondary | ICD-10-CM

## 2011-03-19 DIAGNOSIS — E881 Lipodystrophy, not elsewhere classified: Secondary | ICD-10-CM

## 2011-03-19 DIAGNOSIS — K8689 Other specified diseases of pancreas: Secondary | ICD-10-CM

## 2011-03-19 DIAGNOSIS — R64 Cachexia: Secondary | ICD-10-CM

## 2011-03-19 DIAGNOSIS — Z113 Encounter for screening for infections with a predominantly sexual mode of transmission: Secondary | ICD-10-CM

## 2011-03-19 DIAGNOSIS — Z23 Encounter for immunization: Secondary | ICD-10-CM

## 2011-03-19 MED ORDER — SOMATROPIN (NON-REFRIGERATED) 6 MG ~~LOC~~ SOLR: 6.0000 mg | Freq: Every day | SUBCUTANEOUS | Status: DC

## 2011-03-19 NOTE — Assessment & Plan Note (Signed)
Have written for human growth hormone injections if this will help with lipodystrophy changes.

## 2011-03-19 NOTE — Progress Notes (Signed)
  Subjective:    Patient ID: Noah Henderson, male    DOB: 1951/04/29, 60 y.o.   MRN: [id number]  HPI 60 year old man with HIV, AIDS diagnosed in the 1990s apparently with NRTI induced pancreatitis, also with lipodystrophy, peripheral neuropathy, insominia, fatigue and HIV dementia it appears. He has currently been doing well on nevirapene and truvada. He is doing quite well but is continuing to have trouble gaining weight his he was found to be low testosterone the past this was repleted but no longer has low testosterone levels. He may have been treated with megestrol as well without fact we have discussed growth hormone to prevent to potentially treat his lipodystrophy treat symptomatically investigate this during our stay today. I spent greater than 45 minutes with pt including greater than 50% of time face to face counselling pt and in coordination of care.Dr. Hyacinth Meeker    Review of Systems  Constitutional: Positive for unexpected weight change. Negative for fever, chills, diaphoresis, activity change, appetite change and fatigue.  HENT: Negative for congestion, sore throat, rhinorrhea, sneezing, trouble swallowing and sinus pressure.   Eyes: Negative for photophobia and visual disturbance.  Respiratory: Negative for cough, chest tightness, shortness of breath, wheezing and stridor.   Cardiovascular: Negative for chest pain, palpitations and leg swelling.  Gastrointestinal: Negative for nausea, vomiting, abdominal pain, diarrhea, constipation, blood in stool, abdominal distention and anal bleeding.  Genitourinary: Negative for dysuria, hematuria, flank pain and difficulty urinating.  Musculoskeletal: Negative for myalgias, back pain, joint swelling, arthralgias and gait problem.  Skin: Negative for color change, pallor, rash and wound.  Neurological: Negative for dizziness, tremors, weakness and light-headedness.  Hematological: Negative for adenopathy. Does not bruise/bleed easily.    Psychiatric/Behavioral: Negative for behavioral problems, confusion, sleep disturbance, dysphoric mood, decreased concentration and agitation.       Objective:   Physical Exam  Constitutional: He is oriented to person, place, and time. No distress.  HENT:  Head: Atraumatic.  Mouth/Throat: Oropharynx is clear and moist. No oropharyngeal exudate.       Lipodystrophy changes with loss of facial muscles   Eyes: Conjunctivae and EOM are normal. Pupils are equal, round, and reactive to light. No scleral icterus.  Neck: Normal range of motion. Neck supple. No JVD present.  Cardiovascular: Normal rate, regular rhythm and normal heart sounds.  Exam reveals no gallop and no friction rub.   No murmur heard. Pulmonary/Chest: Effort normal and breath sounds normal. No respiratory distress. He has no wheezes. He has no rales. He exhibits no tenderness.  Abdominal: He exhibits distension. He exhibits no mass. There is no tenderness. There is no rebound and no guarding.  Musculoskeletal: He exhibits no edema and no tenderness.  Lymphadenopathy:    He has no cervical adenopathy.  Neurological: He is alert and oriented to person, place, and time. He has normal reflexes. He exhibits normal muscle tone. Coordination normal.  Skin: Skin is warm and dry. He is not diaphoretic. No erythema. No pallor.  Psychiatric: He has a normal mood and affect. His behavior is normal. Judgment and thought content normal.          Assessment & Plan:  AIDS Continue Viramune and Truvada  HYPOGONADISM Testosterone was normal resolved  PANCREATIC INSUFFICIENCY Continue pancreatic replacement  LIPODYSTROPHY Have written for human growth hormone injections if this will help with lipodystrophy changes.  AIDS wasting syndrome See above prescribing him and growth hormone status will help him gain muscle

## 2011-03-19 NOTE — Assessment & Plan Note (Signed)
Testosterone was normal resolved

## 2011-03-19 NOTE — Assessment & Plan Note (Signed)
See above prescribing him and growth hormone status will help him gain muscle

## 2011-03-19 NOTE — Assessment & Plan Note (Signed)
Continue Viramune and Truvada. 

## 2011-03-19 NOTE — Assessment & Plan Note (Signed)
Continue pancreatic replacement

## 2011-03-24 ENCOUNTER — Telehealth: Payer: Self-pay | Admitting: *Deleted

## 2011-03-24 NOTE — Telephone Encounter (Signed)
Prior auth for growth hormaone to md box for completion

## 2011-03-27 ENCOUNTER — Telehealth: Payer: Self-pay | Admitting: *Deleted

## 2011-03-27 NOTE — Telephone Encounter (Signed)
Signed and in my red older near [place]

## 2011-03-27 NOTE — Telephone Encounter (Signed)
faxed

## 2011-03-27 NOTE — Telephone Encounter (Signed)
[facility] denied the humatrope. Fax to md

## 2011-03-28 ENCOUNTER — Telehealth: Payer: Self-pay | Admitting: *Deleted

## 2011-03-28 NOTE — Telephone Encounter (Signed)
Patient's insurance has denied Humatrope, he is requesting something else be prescribed.  He said that Dr. Daiva Eves had said he may need to prescribe something different if this is not covered. Wendall Mola CMA

## 2011-04-02 ENCOUNTER — Telehealth: Payer: Self-pay | Admitting: *Deleted

## 2011-04-02 NOTE — Telephone Encounter (Signed)
Pt called & asked for ensure. States he used to get them thru PAP. I went to [email or web address] & got the app. LM for him to call back. Need to discuss terms of PAP & if he qualifies

## 2011-04-03 ENCOUNTER — Other Ambulatory Visit: Payer: Self-pay | Admitting: *Deleted

## 2011-04-03 DIAGNOSIS — R110 Nausea: Secondary | ICD-10-CM

## 2011-04-03 MED ORDER — PROMETHAZINE HCL 25 MG PO TABS: 25.0000 mg | ORAL_TABLET | Freq: Two times a day (BID) | ORAL | Status: DC

## 2011-04-08 ENCOUNTER — Other Ambulatory Visit: Payer: Self-pay | Admitting: *Deleted

## 2011-04-08 DIAGNOSIS — R110 Nausea: Secondary | ICD-10-CM

## 2011-04-08 MED ORDER — PROMETHAZINE HCL 25 MG PO TABS: 25.0000 mg | ORAL_TABLET | Freq: Two times a day (BID) | ORAL | Status: DC

## 2011-04-10 ENCOUNTER — Telehealth: Payer: Self-pay | Admitting: Infectious Disease

## 2011-04-10 NOTE — Telephone Encounter (Signed)
I left him another message about the app for ensure. Need to know more info to see if he qualifies

## 2011-04-10 NOTE — Telephone Encounter (Signed)
Mr. Helget inquired about MAP application for Ensure.  He just needs to call to arrange a time to discuss his eligibility and what he needs to bring in.

## 2011-04-17 NOTE — Telephone Encounter (Signed)
Have not heard from Mr. Frisch regarding his eligibility for MAP for Ensure.  Just waiting for him to call back.

## 2011-04-25 ENCOUNTER — Telehealth: Payer: Self-pay | Admitting: *Deleted

## 2011-04-25 NOTE — Telephone Encounter (Signed)
Patient called asking for Kennon Rounds.  I called back and no answer.  He left a message requesting Kennon Rounds to call him at [phone] Wendall Mola CMA

## 2011-05-13 ENCOUNTER — Other Ambulatory Visit: Payer: Self-pay | Admitting: *Deleted

## 2011-05-13 NOTE — Telephone Encounter (Signed)
He will be here 05/27/11 to bring proof that his insurance will not cover his ensure & to sign pap for this. See red file. It already has proof of income in it

## 2011-05-27 ENCOUNTER — Other Ambulatory Visit (INDEPENDENT_AMBULATORY_CARE_PROVIDER_SITE_OTHER): Payer: Medicare Other

## 2011-05-27 ENCOUNTER — Ambulatory Visit: Payer: Medicare Other

## 2011-05-27 ENCOUNTER — Other Ambulatory Visit: Payer: Self-pay | Admitting: Infectious Disease

## 2011-05-27 ENCOUNTER — Telehealth: Payer: Self-pay | Admitting: *Deleted

## 2011-05-27 DIAGNOSIS — B20 Human immunodeficiency virus [HIV] disease: Secondary | ICD-10-CM

## 2011-05-27 DIAGNOSIS — Z113 Encounter for screening for infections with a predominantly sexual mode of transmission: Secondary | ICD-10-CM

## 2011-05-27 DIAGNOSIS — Z79899 Other long term (current) drug therapy: Secondary | ICD-10-CM

## 2011-05-27 DIAGNOSIS — E881 Lipodystrophy, not elsewhere classified: Secondary | ICD-10-CM

## 2011-05-27 LAB — CBC WITH DIFFERENTIAL/PLATELET
Basophils Absolute: 0 10*3/uL (ref 0.0–0.1)
Eosinophils Relative: 2 % (ref 0–5)
Lymphocytes Relative: 20 % (ref 12–46)
MCV: 82.2 fL (ref 78.0–100.0)
Neutrophils Relative %: 70 % (ref 43–77)
Platelets: 68 10*3/uL — ABNORMAL LOW (ref 150–400)
RBC: 4.88 MIL/uL (ref 4.22–5.81)
RDW: 13.3 % (ref 11.5–15.5)
WBC: 5.4 10*3/uL (ref 4.0–10.5)

## 2011-05-27 LAB — COMPREHENSIVE METABOLIC PANEL
AST: 21 U/L (ref 0–37)
Alkaline Phosphatase: 64 U/L (ref 39–117)
BUN: 10 mg/dL (ref 6–23)
Glucose, Bld: 83 mg/dL (ref 70–99)
Sodium: 141 mEq/L (ref 135–145)
Total Bilirubin: 1 mg/dL (ref 0.3–1.2)
Total Protein: 6.6 g/dL (ref 6.0–8.3)

## 2011-05-27 LAB — RPR

## 2011-05-27 LAB — LIPID PANEL
HDL: 38 mg/dL — ABNORMAL LOW (ref >39)
Triglycerides: 136 mg/dL (ref <150)

## 2011-05-27 NOTE — Telephone Encounter (Signed)
He came & brought [place] the denial page from his benefits book.  They will not cover nutrition products for his disease. He has an appt on 9/17 to see md & Britta Mccreedy. He can get the rx for ensure from md-states last time he got it was from [facility]. Britta Mccreedy is doing ADAP for him. When he comes he will bring the social security letter to use as proof of income and then we can send the [employer] PAP form in. I also gave him a number for [facility] [phone]. He is to call Laurette Schimke to see about Ensure thru them. Casey from [facility] had spoken to Amy to get this. The [employer] here only covers [employer].

## 2011-05-28 LAB — GC/CHLAMYDIA PROBE AMP, URINE: GC Probe Amp, Urine: NEGATIVE

## 2011-05-28 LAB — HIV-1 RNA QUANT-NO REFLEX-BLD: HIV 1 RNA Quant: 28 copies/mL — ABNORMAL HIGH (ref <20)

## 2011-05-29 ENCOUNTER — Ambulatory Visit: Payer: Medicare Other

## 2011-06-02 ENCOUNTER — Other Ambulatory Visit: Payer: Self-pay | Admitting: *Deleted

## 2011-06-02 DIAGNOSIS — R197 Diarrhea, unspecified: Secondary | ICD-10-CM

## 2011-06-02 MED ORDER — DIPHENOXYLATE-ATROPINE 2.5-0.025 MG PO TABS: 1.0000 | ORAL_TABLET | Freq: Two times a day (BID) | ORAL | Status: DC | PRN

## 2011-06-09 ENCOUNTER — Ambulatory Visit (INDEPENDENT_AMBULATORY_CARE_PROVIDER_SITE_OTHER): Payer: Medicare Other | Admitting: Infectious Disease

## 2011-06-09 ENCOUNTER — Encounter: Payer: Self-pay | Admitting: Infectious Disease

## 2011-06-09 ENCOUNTER — Ambulatory Visit: Payer: Medicare Other

## 2011-06-09 VITALS — BP 132/78 | HR 57 | Temp 98.1°F | Wt 147.0 lb

## 2011-06-09 DIAGNOSIS — K8689 Other specified diseases of pancreas: Secondary | ICD-10-CM

## 2011-06-09 DIAGNOSIS — Z23 Encounter for immunization: Secondary | ICD-10-CM

## 2011-06-09 DIAGNOSIS — Z79899 Other long term (current) drug therapy: Secondary | ICD-10-CM

## 2011-06-09 DIAGNOSIS — R64 Cachexia: Secondary | ICD-10-CM

## 2011-06-09 DIAGNOSIS — B20 Human immunodeficiency virus [HIV] disease: Secondary | ICD-10-CM

## 2011-06-09 DIAGNOSIS — G479 Sleep disorder, unspecified: Secondary | ICD-10-CM

## 2011-06-09 DIAGNOSIS — Z113 Encounter for screening for infections with a predominantly sexual mode of transmission: Secondary | ICD-10-CM

## 2011-06-09 MED ORDER — SOMATROPIN (NON-REFRIGERATED) 6 MG ~~LOC~~ SOLR: 6.0000 mg | Freq: Every day | SUBCUTANEOUS | Status: DC

## 2011-06-09 MED ORDER — ENSURE IMMUNE HEALTH PO LIQD: 237.0000 mL | Freq: Three times a day (TID) | ORAL | Status: DC

## 2011-06-09 MED ORDER — ZALEPLON 10 MG PO CAPS: ORAL_CAPSULE | ORAL | Status: DC

## 2011-06-09 NOTE — Assessment & Plan Note (Addendum)
Continue viramune and truvada Vaccinate for Hep A and B

## 2011-06-09 NOTE — Progress Notes (Signed)
Subjective:    Patient ID: Noah Henderson, male    DOB: 1951/08/16, 60 y.o.   MRN: [id number]  HPI  60 year old man with HIV, AIDS diagnosed in the 1990s apparently with NRTI induced pancreatitis, also with lipodystrophy, peripheral neuropathy, insominia, fatigue and HIV dementia it appears. He has currently been doing well on nevirapene and truvada. He is doing quite well but is continuing to have trouble gaining weight his he was found to be low testosterone the past this was repleted but no longer has low testosterone levels. We had tried to prescribe growth hormone to him to help with his lipodystrophy but this did not accept the brand name drug we attempted to rx, so we are now trying to get him somadotropin and I filled out script for this today. He has continued to have nearly impossible time putting on weight still at 147, and fluctuating around this weight. He was Hep a antibody and Hep B sab negative so we are trying to revaccinate him for thes He also needed refill of his sonata. We spent over 45 minutes with the pt including greater than 50% oft time in face to face counselling of the pt and in coordintation of care.   Review of Systems  Constitutional: Negative for fever, chills, diaphoresis, activity change, appetite change, fatigue and unexpected weight change.  HENT: Negative for congestion, sore throat, rhinorrhea, sneezing, trouble swallowing and sinus pressure.   Eyes: Negative for photophobia and visual disturbance.  Respiratory: Negative for cough, chest tightness, shortness of breath, wheezing and stridor.   Cardiovascular: Negative for chest pain, palpitations and leg swelling.  Gastrointestinal: Negative for nausea, vomiting, abdominal pain, diarrhea, constipation, blood in stool, abdominal distention and anal bleeding.  Genitourinary: Negative for dysuria, hematuria, flank pain and difficulty urinating.  Musculoskeletal: Negative for myalgias, back pain, joint swelling,  arthralgias and gait problem.  Skin: Negative for color change, pallor, rash and wound.  Neurological: Negative for dizziness, tremors, weakness and light-headedness.  Hematological: Negative for adenopathy. Does not bruise/bleed easily.  Psychiatric/Behavioral: Negative for behavioral problems, confusion, sleep disturbance, dysphoric mood, decreased concentration and agitation.       Objective:   Physical Exam  Constitutional: He is oriented to person, place, and time. He appears well-nourished. No distress.       Lipodystrophy changes in face, abdomena, and legs  HENT:  Head: Normocephalic and atraumatic.  Mouth/Throat: Oropharynx is clear and moist. No oropharyngeal exudate.  Eyes: Conjunctivae and EOM are normal. Pupils are equal, round, and reactive to light. No scleral icterus.  Neck: Normal range of motion. Neck supple. No JVD present.  Cardiovascular: Normal rate, regular rhythm and normal heart sounds.  Exam reveals no gallop and no friction rub.   No murmur heard. Pulmonary/Chest: Effort normal and breath sounds normal. No respiratory distress. He has no wheezes. He has no rales. He exhibits no tenderness.  Abdominal: He exhibits no distension and no mass. There is no tenderness. There is no rebound and no guarding.  Musculoskeletal: He exhibits no edema and no tenderness.  Lymphadenopathy:    He has no cervical adenopathy.  Neurological: He is alert and oriented to person, place, and time. He has normal reflexes. He exhibits normal muscle tone. Coordination normal.  Skin: Skin is warm and dry. He is not diaphoretic. No erythema. No pallor.  Psychiatric: He has a normal mood and affect. His behavior is normal. Judgment and thought content normal.  Assessment & Plan:  AIDS Continue viramune and truvada Vaccinate for Hep A and B  PANCREATIC INSUFFICIENCY Continue pancreatic replacment meds  AIDS wasting syndrome rx somadotropin, bring back for labs in 2  months Filled script for ensure

## 2011-06-09 NOTE — Assessment & Plan Note (Signed)
rx somadotropin, bring back for labs in 2 months Filled script for ensure

## 2011-06-09 NOTE — Assessment & Plan Note (Signed)
Continue pancreatic replacment meds

## 2011-06-10 ENCOUNTER — Other Ambulatory Visit: Payer: Self-pay | Admitting: *Deleted

## 2011-06-10 ENCOUNTER — Telehealth: Payer: Self-pay | Admitting: *Deleted

## 2011-06-10 DIAGNOSIS — B20 Human immunodeficiency virus [HIV] disease: Secondary | ICD-10-CM

## 2011-06-10 DIAGNOSIS — R64 Cachexia: Secondary | ICD-10-CM

## 2011-06-10 MED ORDER — ENSURE IMMUNE HEALTH PO LIQD: 237.0000 mL | Freq: Three times a day (TID) | ORAL | Status: DC

## 2011-06-10 NOTE — Telephone Encounter (Signed)
I am trying to send a pap for ensure. He needs to tell us his street address as they will not take the appp without a street address. He called back & it is  [address] in [place], [place] [address]. He has not heard from [place] with [facility] about getting Ensure there. I called them & LM for Rhonda to call me about him. md to sign app & then will mail

## 2011-06-11 ENCOUNTER — Telehealth: Payer: Self-pay | Admitting: *Deleted

## 2011-06-11 ENCOUNTER — Telehealth: Payer: Self-pay | Admitting: Infectious Disease

## 2011-06-11 DIAGNOSIS — B20 Human immunodeficiency virus [HIV] disease: Secondary | ICD-10-CM

## 2011-06-11 NOTE — Telephone Encounter (Signed)
The application for [employer] for Ensure was mailed today.  Will check in a week on the status of the application.

## 2011-06-11 NOTE — Telephone Encounter (Signed)
[facility] will drop off a case of ensure. This should hold him until his pap goes thru from [employer]

## 2011-06-12 NOTE — Telephone Encounter (Signed)
LM for pt to call me. His serostim isdue but his insurance copay is 33% or $3,358 per month. I have a co pay card for $200 per month for this drug. I could not find a program that will pay for it since he does have insurance. Hopefully he will be able to get the med at an acceptable cost with the co pay card

## 2011-06-16 ENCOUNTER — Telehealth: Payer: Self-pay | Admitting: *Deleted

## 2011-06-16 NOTE — Telephone Encounter (Signed)
I left another message for him. II have a copay card for $200. His insurance has a copay of over 3K to be met each month before they will cover this

## 2011-06-16 NOTE — Telephone Encounter (Signed)
I spoke with the rep at [facility] specialty pharmacy. He cannot use the copay card at $200 as he has medicare. The free drug thru needymeds.com has to be for people with no insurance. Will tell pt this when he calls . Also forwarded to md here

## 2011-06-17 NOTE — Telephone Encounter (Signed)
LM to call about his serostim. I have not been able to find a way to pay for it

## 2011-06-19 ENCOUNTER — Telehealth: Payer: Self-pay | Admitting: *Deleted

## 2011-06-19 NOTE — Telephone Encounter (Signed)
rec'd fax from [employer]. Ensure denied due to high demand & low availability at this time. Pt got same letter

## 2011-06-26 ENCOUNTER — Telehealth: Payer: Self-pay | Admitting: *Deleted

## 2011-06-26 NOTE — Telephone Encounter (Signed)
Pt faxed me a letter c/o being denied by [employer] for his ensure. They have high demand & are unable to accept new or renewal participants. The letter suggested food stamps. I left a message asking him to call me back.

## 2011-06-26 NOTE — Telephone Encounter (Signed)
I checked on line about food stamps in [place]. Applicants must work 20 hours each week or more.  This can be working at a program they place you in or a regular job. They can have them for 3 months in a 3 year period, unless they have been laid off & unable to find another job.

## 2011-07-01 ENCOUNTER — Telehealth: Payer: Self-pay | Admitting: *Deleted

## 2011-07-01 NOTE — Telephone Encounter (Signed)
Returned his call.

## 2011-07-02 NOTE — Telephone Encounter (Signed)
LM again.

## 2011-07-03 NOTE — Telephone Encounter (Signed)
LM

## 2011-07-08 ENCOUNTER — Telehealth: Payer: Self-pay | Admitting: *Deleted

## 2011-07-08 NOTE — Telephone Encounter (Signed)
I called him right back & kept getting a busy signal.

## 2011-07-09 NOTE — Telephone Encounter (Signed)
I called & left message that when he calls back to not leave a message but ask that I be found so I can talk with him

## 2011-07-10 NOTE — Telephone Encounter (Signed)
LM

## 2011-07-11 NOTE — Telephone Encounter (Signed)
I explained that [employer] will not cover the Ensure & they suggested food stamps. He states he makes too much. He is on disability. I suggested instant breakfast between meals. States he cannot afford. I explained that his pharmacy told me he is ineligible for serostim since he has medicare. However he has a $3000 spenddown before he can get help. I LM for Amy Faw to see if he is with [employer] & if they could provide ensure

## 2011-07-14 ENCOUNTER — Telehealth: Payer: Self-pay | Admitting: *Deleted

## 2011-07-21 NOTE — Telephone Encounter (Signed)
Opened in error

## 2011-07-23 ENCOUNTER — Telehealth: Payer: Self-pay | Admitting: Infectious Disease

## 2011-07-23 NOTE — Telephone Encounter (Signed)
Called Noah Henderson at "[facility]"  Unable to reach her.  Will try again

## 2011-07-24 ENCOUNTER — Other Ambulatory Visit: Payer: Self-pay | Admitting: *Deleted

## 2011-07-24 ENCOUNTER — Telehealth: Payer: Self-pay | Admitting: Infectious Disease

## 2011-07-24 DIAGNOSIS — B20 Human immunodeficiency virus [HIV] disease: Secondary | ICD-10-CM

## 2011-07-24 MED ORDER — SOMATROPIN (NON-REFRIGERATED) 6 MG ~~LOC~~ SOLR: 6.0000 mg | Freq: Every day | SUBCUTANEOUS | Status: DC

## 2011-07-24 NOTE — Telephone Encounter (Signed)
Received call from [facility] at [facility] concerning Serostim.  Wanted to know if pt has insurance.  He has [employer] Medicare Complete.  Called [facility] in [place].  Pt has Part D Medicare.

## 2011-07-31 ENCOUNTER — Other Ambulatory Visit: Payer: Self-pay | Admitting: *Deleted

## 2011-07-31 DIAGNOSIS — K8689 Other specified diseases of pancreas: Secondary | ICD-10-CM

## 2011-07-31 MED ORDER — PANCRELIPASE (LIP-PROT-AMYL) 24000-76000 UNITS PO CPEP: 24000.0000 [IU] | ORAL_CAPSULE | Freq: Three times a day (TID) | ORAL | Status: DC

## 2011-08-08 ENCOUNTER — Telehealth: Payer: Self-pay | Admitting: *Deleted

## 2011-08-08 NOTE — Telephone Encounter (Signed)
We rec'd a fax with bills & a cover letter. I lm on his phone that I was going to give them to Randolm Idol on Monday who does coding. She should be able to call him late Monday about this  Also rec'd a denial for humatrope. Have been unable to find coverage for his serostim which is a drug they suggested as an alternative to serostim. Will ask Pam to assist on this as well. If unable to get coverage for either drug, will ask md about this

## 2011-08-11 ENCOUNTER — Telehealth: Payer: Self-pay | Admitting: Infectious Disease

## 2011-08-11 NOTE — Telephone Encounter (Signed)
Noah Henderson is looking into the erroneous coding issue. To md about the inability to get serostim or humatrope

## 2011-08-11 NOTE — Telephone Encounter (Signed)
Called Antony Salmon at [facility] about denied charges for Mr. Falkner.  She will correct the diagnosis that are being used. For the Hep A and Hep B dx is v05.3.

## 2011-08-27 ENCOUNTER — Other Ambulatory Visit: Payer: Self-pay | Admitting: *Deleted

## 2011-08-27 DIAGNOSIS — K8689 Other specified diseases of pancreas: Secondary | ICD-10-CM

## 2011-08-27 DIAGNOSIS — K219 Gastro-esophageal reflux disease without esophagitis: Secondary | ICD-10-CM

## 2011-08-27 DIAGNOSIS — B20 Human immunodeficiency virus [HIV] disease: Secondary | ICD-10-CM

## 2011-08-27 DIAGNOSIS — F0280 Dementia in other diseases classified elsewhere without behavioral disturbance: Secondary | ICD-10-CM

## 2011-08-27 DIAGNOSIS — I10 Essential (primary) hypertension: Secondary | ICD-10-CM | POA: Insufficient documentation

## 2011-08-27 MED ORDER — EMTRICITABINE-TENOFOVIR DF 200-300 MG PO TABS: 1.0000 | ORAL_TABLET | Freq: Every day | ORAL | Status: DC

## 2011-08-27 MED ORDER — NEVIRAPINE ER 400 MG PO TB24: 400.0000 mg | ORAL_TABLET | Freq: Every day | ORAL | Status: DC

## 2011-08-27 MED ORDER — ATENOLOL 50 MG PO TABS: 50.0000 mg | ORAL_TABLET | Freq: Every day | ORAL | Status: DC

## 2011-08-27 MED ORDER — AMITRIPTYLINE HCL 50 MG PO TABS: 50.0000 mg | ORAL_TABLET | Freq: Every day | ORAL | Status: DC

## 2011-08-27 MED ORDER — OMEPRAZOLE 40 MG PO CPDR: 40.0000 mg | DELAYED_RELEASE_CAPSULE | Freq: Every day | ORAL | Status: DC

## 2011-08-27 MED ORDER — PANCRELIPASE (LIP-PROT-AMYL) 24000-76000 UNITS PO CPEP: 24000.0000 [IU] | ORAL_CAPSULE | Freq: Three times a day (TID) | ORAL | Status: DC

## 2011-09-11 ENCOUNTER — Other Ambulatory Visit: Payer: Self-pay | Admitting: Infectious Disease

## 2011-09-11 ENCOUNTER — Other Ambulatory Visit: Payer: Medicare Other

## 2011-09-11 DIAGNOSIS — B20 Human immunodeficiency virus [HIV] disease: Secondary | ICD-10-CM

## 2011-09-11 DIAGNOSIS — Z79899 Other long term (current) drug therapy: Secondary | ICD-10-CM

## 2011-09-11 DIAGNOSIS — Z113 Encounter for screening for infections with a predominantly sexual mode of transmission: Secondary | ICD-10-CM

## 2011-09-11 LAB — CBC WITH DIFFERENTIAL/PLATELET
Basophils Absolute: 0 10*3/uL (ref 0.0–0.1)
Basophils Relative: 0 % (ref 0–1)
Eosinophils Absolute: 0.1 10*3/uL (ref 0.0–0.7)
HCT: 37.5 % — ABNORMAL LOW (ref 39.0–52.0)
Hemoglobin: 13.7 g/dL (ref 13.0–17.0)
MCH: 29.9 pg (ref 26.0–34.0)
MCHC: 36.5 g/dL — ABNORMAL HIGH (ref 30.0–36.0)
Monocytes Absolute: 0.4 10*3/uL (ref 0.1–1.0)
Monocytes Relative: 10 % (ref 3–12)
Neutrophils Relative %: 57 % (ref 43–77)
RDW: 13.1 % (ref 11.5–15.5)

## 2011-09-11 LAB — COMPLETE METABOLIC PANEL WITH GFR
AST: 21 U/L (ref 0–37)
Alkaline Phosphatase: 78 U/L (ref 39–117)
BUN: 16 mg/dL (ref 6–23)
GFR, Est Non African American: 85 mL/min
Glucose, Bld: 79 mg/dL (ref 70–99)
Total Bilirubin: 0.7 mg/dL (ref 0.3–1.2)

## 2011-09-11 LAB — LIPID PANEL
Cholesterol: 133 mg/dL (ref 0–200)
HDL: 35 mg/dL — ABNORMAL LOW (ref >39)
Triglycerides: 118 mg/dL (ref <150)

## 2011-09-12 LAB — T-HELPER CELL (CD4) - (RCID CLINIC ONLY)
CD4 % Helper T Cell: 28 % — ABNORMAL LOW (ref 33–55)
CD4 T Cell Abs: 300 uL — ABNORMAL LOW (ref 400–2700)

## 2011-09-12 LAB — GC/CHLAMYDIA PROBE AMP, URINE: GC Probe Amp, Urine: NEGATIVE

## 2011-09-15 LAB — HIV-1 RNA QUANT-NO REFLEX-BLD
HIV 1 RNA Quant: 5320 copies/mL — ABNORMAL HIGH (ref <20)
HIV-1 RNA Quant, Log: 3.73 {Log} — ABNORMAL HIGH (ref <1.30)

## 2011-09-26 ENCOUNTER — Telehealth: Payer: Self-pay | Admitting: Infectious Disease

## 2011-09-26 DIAGNOSIS — B20 Human immunodeficiency virus [HIV] disease: Secondary | ICD-10-CM

## 2011-09-26 NOTE — Telephone Encounter (Signed)
Pt needs genotype added to viral load just drawn

## 2011-09-29 ENCOUNTER — Encounter: Payer: Self-pay | Admitting: Infectious Disease

## 2011-09-29 ENCOUNTER — Ambulatory Visit (INDEPENDENT_AMBULATORY_CARE_PROVIDER_SITE_OTHER): Payer: Medicare Other

## 2011-09-29 ENCOUNTER — Ambulatory Visit (INDEPENDENT_AMBULATORY_CARE_PROVIDER_SITE_OTHER): Payer: Medicare Other | Admitting: Infectious Disease

## 2011-09-29 VITALS — BP 142/80 | HR 60 | Temp 98.5°F | Wt 146.0 lb

## 2011-09-29 DIAGNOSIS — E881 Lipodystrophy, not elsewhere classified: Secondary | ICD-10-CM

## 2011-09-29 DIAGNOSIS — B20 Human immunodeficiency virus [HIV] disease: Secondary | ICD-10-CM

## 2011-09-29 DIAGNOSIS — R64 Cachexia: Secondary | ICD-10-CM

## 2011-09-29 DIAGNOSIS — I10 Essential (primary) hypertension: Secondary | ICD-10-CM

## 2011-09-29 DIAGNOSIS — G479 Sleep disorder, unspecified: Secondary | ICD-10-CM

## 2011-09-29 MED ORDER — ZALEPLON 10 MG PO CAPS: ORAL_CAPSULE | ORAL | Status: DC

## 2011-09-29 MED ORDER — ENSURE IMMUNE HEALTH PO LIQD: 237.0000 mL | Freq: Three times a day (TID) | ORAL | Status: DC

## 2011-09-29 NOTE — Assessment & Plan Note (Signed)
Not optimally controlled at this time

## 2011-09-29 NOTE — Progress Notes (Signed)
  Subjective:    Patient ID: Noah Henderson, male    DOB: 1951-07-12, 61 y.o.   MRN: [id number]  HPI  61 year old man with HIV, AIDS diagnosed in the 1990s apparently with NRTI induced pancreatitis, also with lipodystrophy, peripheral neuropathy, insominia, fatigue and HIV dementia it appears. He had been doing well on nevirapene and truvada, but last month had viral load into the 5k range. His genotype is still pending.   He was upste that his serostim was somehow not approved by medicare and voiced displeasure about this.  He asked for higher pill count on rx for his sonata requesting for #90. He states that he is using up to two pills at night every night.  I spent greater than 45 minutes with the patient including greater than 50% of time in face to face counsel of the patient and in coordination of their care.    Review of Systems  Constitutional: Negative for fever, chills, diaphoresis, activity change, appetite change, fatigue and unexpected weight change.  HENT: Negative for congestion, sore throat, rhinorrhea, sneezing, trouble swallowing and sinus pressure.   Eyes: Negative for photophobia and visual disturbance.  Respiratory: Negative for cough, chest tightness, shortness of breath, wheezing and stridor.   Cardiovascular: Negative for chest pain, palpitations and leg swelling.  Gastrointestinal: Negative for nausea, vomiting, abdominal pain, diarrhea, constipation, blood in stool, abdominal distention and anal bleeding.  Genitourinary: Negative for dysuria, hematuria, flank pain and difficulty urinating.  Musculoskeletal: Negative for myalgias, back pain, joint swelling, arthralgias and gait problem.  Skin: Negative for color change, pallor, rash and wound.  Neurological: Negative for dizziness, tremors, weakness and light-headedness.  Hematological: Negative for adenopathy. Does not bruise/bleed easily.  Psychiatric/Behavioral: Positive for sleep disturbance. Negative for  behavioral problems, confusion, dysphoric mood, decreased concentration and agitation.       Objective:   Physical Exam  Constitutional: He is oriented to person, place, and time. He appears well-developed and well-nourished. No distress.  HENT:  Head: Normocephalic and atraumatic.  Mouth/Throat: Oropharynx is clear and moist. No oropharyngeal exudate.  Eyes: Conjunctivae and EOM are normal. Pupils are equal, round, and reactive to light. No scleral icterus.  Neck: Normal range of motion. Neck supple. No JVD present.  Cardiovascular: Normal rate, regular rhythm and normal heart sounds.  Exam reveals no gallop and no friction rub.   No murmur heard. Pulmonary/Chest: Effort normal and breath sounds normal. No respiratory distress. He has no wheezes. He has no rales. He exhibits no tenderness.  Abdominal: He exhibits no distension and no mass. There is no tenderness. There is no rebound and no guarding.  Musculoskeletal: He exhibits no edema and no tenderness.  Lymphadenopathy:    He has no cervical adenopathy.  Neurological: He is alert and oriented to person, place, and time. He has normal reflexes. He exhibits normal muscle tone. Coordination normal.  Skin: Skin is warm and dry. He is not diaphoretic. No erythema. No pallor.  Psychiatric: He has a normal mood and affect. His behavior is normal. Judgment and thought content normal.          Assessment & Plan:

## 2011-09-29 NOTE — Assessment & Plan Note (Signed)
Will try to facilitate serostim for him. I personally think that exercise and weight loss might be more feasable given recent difficulties we have had getting this for pts

## 2011-09-29 NOTE — Assessment & Plan Note (Signed)
Genotype pending. Will likely have to change him to a PI based regimen

## 2011-10-01 LAB — HIV-1 RNA QUANT-NO REFLEX-BLD
HIV 1 RNA Quant: 7409 {copies}/mL — ABNORMAL HIGH
HIV-1 RNA Quant, Log: 3.87 {Log} — ABNORMAL HIGH

## 2011-10-03 ENCOUNTER — Telehealth: Payer: Self-pay | Admitting: Licensed Clinical Social Worker

## 2011-10-03 LAB — HIV-1 GENOTYPR PLUS

## 2011-10-03 NOTE — Telephone Encounter (Signed)
Patient sent a fax stating that the pharmacy needed the directions changed on his medication Zaleplon 10 mg because the quantity was changed to 90 but the directions still has take 1 daily at bedtime. It needs to say take 1 to 3 daily prn. Please advise.

## 2011-10-06 ENCOUNTER — Other Ambulatory Visit: Payer: Self-pay | Admitting: *Deleted

## 2011-10-06 ENCOUNTER — Telehealth: Payer: Self-pay | Admitting: Licensed Clinical Social Worker

## 2011-10-06 DIAGNOSIS — R197 Diarrhea, unspecified: Secondary | ICD-10-CM

## 2011-10-06 MED ORDER — DIPHENOXYLATE-ATROPINE 2.5-0.025 MG PO TABS: 1.0000 | ORAL_TABLET | Freq: Two times a day (BID) | ORAL | Status: DC | PRN

## 2011-10-06 NOTE — Telephone Encounter (Signed)
Per Dr. Daiva Eves he would like for this patient to stop his current HIV medications due to resistance and he will start him on a new regimen at his ov 10/14/11. I called the patient and left him a message to call me back.

## 2011-10-14 ENCOUNTER — Ambulatory Visit (INDEPENDENT_AMBULATORY_CARE_PROVIDER_SITE_OTHER): Payer: Medicare Other | Admitting: Infectious Disease

## 2011-10-14 ENCOUNTER — Encounter: Payer: Self-pay | Admitting: Infectious Disease

## 2011-10-14 VITALS — BP 137/75 | HR 75 | Temp 97.9°F | Wt 147.0 lb

## 2011-10-14 DIAGNOSIS — G4700 Insomnia, unspecified: Secondary | ICD-10-CM

## 2011-10-14 DIAGNOSIS — B20 Human immunodeficiency virus [HIV] disease: Secondary | ICD-10-CM

## 2011-10-14 DIAGNOSIS — N529 Male erectile dysfunction, unspecified: Secondary | ICD-10-CM

## 2011-10-14 MED ORDER — CLONAZEPAM 0.5 MG PO TABS: 0.5000 mg | ORAL_TABLET | ORAL | Status: DC

## 2011-10-14 MED ORDER — DARUNAVIR ETHANOLATE 800 MG PO TABS: 800.0000 mg | ORAL_TABLET | Freq: Every day | ORAL | Status: DC

## 2011-10-14 MED ORDER — TADALAFIL 5 MG PO TABS: 5.0000 mg | ORAL_TABLET | Freq: Every day | ORAL | Status: AC | PRN

## 2011-10-14 MED ORDER — RITONAVIR 100 MG PO TABS: 100.0000 mg | ORAL_TABLET | Freq: Every day | ORAL | Status: DC

## 2011-10-14 MED ORDER — TADALAFIL 5 MG PO TABS: 5.0000 mg | ORAL_TABLET | Freq: Every day | ORAL | Status: DC | PRN

## 2011-10-14 MED ORDER — EMTRICITABINE-TENOFOVIR DF 200-300 MG PO TABS: 1.0000 | ORAL_TABLET | Freq: Every day | ORAL | Status: DC

## 2011-10-14 NOTE — Assessment & Plan Note (Signed)
Try cialis (he has coupon for them) if he gets dizzy, hold atenolol and then we should go to a different shorter acting drug

## 2011-10-14 NOTE — Progress Notes (Signed)
  Subjective:    Patient ID: Noah Henderson, male    DOB: Sep 05, 1951, 61 y.o.   MRN: [id number]  HPI  61 year old with HIV, previously well controlled on viramune XR and truvada this winter with breakthrough viremia and now found to have a K103 mutation.   We reviewed importance of compliance with ARVs and I went into detail with him re new regimen which is prezista, norvir and truvada. He is wanting more lunesta having taken up to 3 per night. I informed him this is an excessive dose and I proposed klonopin qhs. He also asked for cialis rx for masturbation. I spent greater than 45 minutes with the patient including greater than 50% of time in face to face counsel of the patient and in coordination of their care.   Review of Systems  Constitutional: Negative for fever, chills, diaphoresis, activity change, appetite change, fatigue and unexpected weight change.  HENT: Negative for congestion, sore throat, rhinorrhea, sneezing, trouble swallowing and sinus pressure.   Eyes: Negative for photophobia and visual disturbance.  Respiratory: Negative for cough, chest tightness, shortness of breath, wheezing and stridor.   Cardiovascular: Negative for chest pain, palpitations and leg swelling.  Gastrointestinal: Negative for nausea, vomiting, abdominal pain, diarrhea, constipation, blood in stool, abdominal distention and anal bleeding.  Genitourinary: Negative for dysuria, hematuria, flank pain and difficulty urinating.  Musculoskeletal: Negative for myalgias, back pain, joint swelling, arthralgias and gait problem.  Skin: Negative for color change, pallor, rash and wound.  Neurological: Negative for dizziness, tremors, weakness and light-headedness.  Hematological: Negative for adenopathy. Does not bruise/bleed easily.  Psychiatric/Behavioral: Negative for behavioral problems, confusion, sleep disturbance, dysphoric mood, decreased concentration and agitation.       Objective:   Physical Exam    Constitutional: He is oriented to person, place, and time. He appears well-developed and well-nourished. No distress.  HENT:  Head: Normocephalic and atraumatic.  Mouth/Throat: Oropharynx is clear and moist. No oropharyngeal exudate.  Eyes: Conjunctivae and EOM are normal. Pupils are equal, round, and reactive to light. No scleral icterus.  Neck: Normal range of motion. Neck supple. No JVD present.  Cardiovascular: Normal rate, regular rhythm and normal heart sounds.  Exam reveals no gallop and no friction rub.   No murmur heard. Pulmonary/Chest: Effort normal and breath sounds normal. No respiratory distress. He has no wheezes. He has no rales. He exhibits no tenderness.  Abdominal: He exhibits no distension and no mass. There is no tenderness. There is no rebound and no guarding.  Musculoskeletal: He exhibits no edema and no tenderness.  Lymphadenopathy:    He has no cervical adenopathy.  Neurological: He is alert and oriented to person, place, and time. He has normal reflexes. He exhibits normal muscle tone. Coordination normal.  Skin: Skin is warm and dry. He is not diaphoretic. No erythema. No pallor.  Psychiatric: He has a normal mood and affect. His behavior is normal. Judgment and thought content normal.          Assessment & Plan:  Erectile dysfunction Try cialis (he has coupon for them) if he gets dizzy, hold atenolol and then we should go to a different shorter acting drug  AIDS Change to prezista norvir and truvada. Genotype today as well sicne he cotinued on viramune and truvada when he should have stopped them  Insomnia Try klonopin .05mg  qhs up to 1mg

## 2011-10-14 NOTE — Assessment & Plan Note (Signed)
Try klonopin .05mg  qhs up to 1mg 

## 2011-10-14 NOTE — Assessment & Plan Note (Signed)
Change to prezista norvir and truvada. Genotype today as well sicne he cotinued on viramune and truvada when he should have stopped them

## 2011-10-23 ENCOUNTER — Telehealth: Payer: Self-pay | Admitting: Licensed Clinical Social Worker

## 2011-10-23 NOTE — Telephone Encounter (Signed)
Patient sent a faxed message stating that he is having to take at least ( 2) 0.5mg  clonazepam to help with sleep. He would like it  increased  to 2 mg  or quantity increased to 60 or 90.

## 2011-10-27 ENCOUNTER — Other Ambulatory Visit: Payer: Self-pay | Admitting: Licensed Clinical Social Worker

## 2011-10-27 DIAGNOSIS — G4700 Insomnia, unspecified: Secondary | ICD-10-CM

## 2011-10-27 MED ORDER — CLONAZEPAM 1 MG PO TABS: 1.0000 mg | ORAL_TABLET | Freq: Every day | ORAL | Status: DC

## 2011-10-27 NOTE — Telephone Encounter (Signed)
Per Dr. Daiva Eves ok to up clonazepam to 1mg  #30, I will call this in to [facility] in [place].

## 2011-10-27 NOTE — Telephone Encounter (Signed)
Thanks Tamika. 

## 2011-10-29 ENCOUNTER — Telehealth: Payer: Self-pay | Admitting: Licensed Clinical Social Worker

## 2011-10-29 NOTE — Telephone Encounter (Signed)
Spoke with [facility] lab the viral load was to low to do Genotype for this patient.

## 2011-10-30 NOTE — Telephone Encounter (Signed)
Very good 

## 2011-11-13 ENCOUNTER — Other Ambulatory Visit: Payer: Medicare Other

## 2011-11-13 DIAGNOSIS — B20 Human immunodeficiency virus [HIV] disease: Secondary | ICD-10-CM

## 2011-11-14 LAB — CBC WITH DIFFERENTIAL/PLATELET
Basophils Relative: 0 % (ref 0–1)
Eosinophils Absolute: 0.1 10*3/uL (ref 0.0–0.7)
Eosinophils Relative: 2 % (ref 0–5)
Hemoglobin: 14.4 g/dL (ref 13.0–17.0)
MCH: 30.9 pg (ref 26.0–34.0)
MCHC: 35.4 g/dL (ref 30.0–36.0)
MCV: 87.3 fL (ref 78.0–100.0)
Monocytes Relative: 8 % (ref 3–12)
Neutrophils Relative %: 71 % (ref 43–77)

## 2011-11-14 LAB — COMPLETE METABOLIC PANEL WITH GFR
ALT: 15 U/L (ref 0–53)
CO2: 23 mEq/L (ref 19–32)
Calcium: 9.5 mg/dL (ref 8.4–10.5)
Chloride: 107 mEq/L (ref 96–112)
GFR, Est African American: >89 mL/min
Sodium: 142 mEq/L (ref 135–145)
Total Bilirubin: 1 mg/dL (ref 0.3–1.2)
Total Protein: 7.1 g/dL (ref 6.0–8.3)

## 2011-11-14 LAB — T-HELPER CELL (CD4) - (RCID CLINIC ONLY): CD4 T Cell Abs: 300 uL — ABNORMAL LOW (ref 400–2700)

## 2011-11-18 LAB — HIV-1 RNA QUANT-NO REFLEX-BLD: HIV-1 RNA Quant, Log: <1.3 {Log} (ref <1.30)

## 2011-11-27 ENCOUNTER — Encounter: Payer: Self-pay | Admitting: Infectious Disease

## 2011-11-27 ENCOUNTER — Ambulatory Visit (INDEPENDENT_AMBULATORY_CARE_PROVIDER_SITE_OTHER): Payer: Medicare Other | Admitting: Infectious Disease

## 2011-11-27 VITALS — BP 127/75 | HR 68 | Temp 98.3°F | Wt 148.0 lb

## 2011-11-27 DIAGNOSIS — B20 Human immunodeficiency virus [HIV] disease: Secondary | ICD-10-CM

## 2011-11-27 DIAGNOSIS — F419 Anxiety disorder, unspecified: Secondary | ICD-10-CM

## 2011-11-27 DIAGNOSIS — F411 Generalized anxiety disorder: Secondary | ICD-10-CM

## 2011-11-27 DIAGNOSIS — G4700 Insomnia, unspecified: Secondary | ICD-10-CM

## 2011-11-27 DIAGNOSIS — I10 Essential (primary) hypertension: Secondary | ICD-10-CM

## 2011-11-27 DIAGNOSIS — L0292 Furuncle, unspecified: Secondary | ICD-10-CM

## 2011-11-27 MED ORDER — CLONAZEPAM 1 MG PO TABS: 1.0000 mg | ORAL_TABLET | Freq: Two times a day (BID) | ORAL | Status: DC | PRN

## 2011-11-27 MED ORDER — SULFAMETHOXAZOLE-TMP DS 800-160 MG PO TABS: 2.0000 | ORAL_TABLET | Freq: Two times a day (BID) | ORAL | Status: AC

## 2011-11-27 NOTE — Assessment & Plan Note (Signed)
Continue prezista norvir and truvada 

## 2011-11-27 NOTE — Assessment & Plan Note (Signed)
Well controlled 

## 2011-11-27 NOTE — Assessment & Plan Note (Signed)
Bactrim DS two bid x 10 days I and D if does not resolve

## 2011-11-27 NOTE — Progress Notes (Signed)
  Subjective:    Patient ID: Noah Henderson, male    DOB: November 13, 1950, 61 y.o.   MRN: [id number]  HPI  61 year old with HIV, previously well controlled on viramune XR and truvada this winter with breakthrough viremia and now found to have a K103 mutation changed to prezista norvir and truvada with VL now below 20 and cd4 coming up. He has noticed an area on his back where there was painful hair follicle that drained purulent material and requests rx for this.       Review of Systems  Constitutional: Negative for fever, chills, diaphoresis, activity change, appetite change, fatigue and unexpected weight change.  HENT: Negative for congestion, sore throat, rhinorrhea, sneezing, trouble swallowing and sinus pressure.   Eyes: Negative for photophobia and visual disturbance.  Respiratory: Negative for cough, chest tightness, shortness of breath, wheezing and stridor.   Cardiovascular: Negative for chest pain, palpitations and leg swelling.  Gastrointestinal: Negative for nausea, vomiting, abdominal pain, diarrhea, constipation, blood in stool, abdominal distention and anal bleeding.  Genitourinary: Negative for dysuria, hematuria, flank pain and difficulty urinating.  Musculoskeletal: Negative for myalgias, back pain, joint swelling, arthralgias and gait problem.  Skin: Negative for color change, pallor, rash and wound.  Neurological: Negative for dizziness, tremors, weakness and light-headedness.  Hematological: Negative for adenopathy. Does not bruise/bleed easily.  Psychiatric/Behavioral: Negative for behavioral problems, confusion, sleep disturbance, dysphoric mood, decreased concentration and agitation.       Objective:   Physical Exam  Constitutional: He is oriented to person, place, and time. He appears well-developed and well-nourished. No distress.  HENT:  Head: Normocephalic and atraumatic.  Mouth/Throat: Oropharynx is clear and moist. No oropharyngeal exudate.  Eyes: Conjunctivae  and EOM are normal. Pupils are equal, round, and reactive to light. No scleral icterus.  Neck: Normal range of motion. Neck supple. No JVD present.  Cardiovascular: Normal rate, regular rhythm and normal heart sounds.  Exam reveals no gallop and no friction rub.   No murmur heard. Pulmonary/Chest: Effort normal and breath sounds normal. No respiratory distress. He has no wheezes. He has no rales. He exhibits no tenderness.  Abdominal: He exhibits no distension and no mass. There is no tenderness. There is no rebound and no guarding.  Musculoskeletal: He exhibits no edema and no tenderness.  Lymphadenopathy:    He has no cervical adenopathy.  Neurological: He is alert and oriented to person, place, and time. He has normal reflexes. He exhibits normal muscle tone. Coordination normal.  Skin: Skin is warm and dry. He is not diaphoretic. No erythema. No pallor.     Psychiatric: He has a normal mood and affect. His behavior is normal. Judgment and thought content normal.          Assessment & Plan:  AIDS Continue prezista norvir and truvada  Furuncle Bactrim DS two bid x 10 days I and D if does not resolve  HTN (hypertension) Well controlled

## 2011-12-17 ENCOUNTER — Other Ambulatory Visit: Payer: Self-pay | Admitting: *Deleted

## 2011-12-17 DIAGNOSIS — Z113 Encounter for screening for infections with a predominantly sexual mode of transmission: Secondary | ICD-10-CM

## 2011-12-29 ENCOUNTER — Other Ambulatory Visit: Payer: Self-pay | Admitting: Licensed Clinical Social Worker

## 2011-12-29 DIAGNOSIS — K219 Gastro-esophageal reflux disease without esophagitis: Secondary | ICD-10-CM

## 2011-12-29 MED ORDER — OMEPRAZOLE 40 MG PO CPDR: 40.0000 mg | DELAYED_RELEASE_CAPSULE | Freq: Every day | ORAL | Status: DC

## 2012-02-03 ENCOUNTER — Other Ambulatory Visit: Payer: Self-pay | Admitting: Licensed Clinical Social Worker

## 2012-02-03 DIAGNOSIS — R197 Diarrhea, unspecified: Secondary | ICD-10-CM

## 2012-02-04 ENCOUNTER — Other Ambulatory Visit: Payer: Self-pay | Admitting: *Deleted

## 2012-02-04 DIAGNOSIS — R197 Diarrhea, unspecified: Secondary | ICD-10-CM

## 2012-02-04 MED ORDER — DIPHENOXYLATE-ATROPINE 2.5-0.025 MG PO TABS: 1.0000 | ORAL_TABLET | Freq: Two times a day (BID) | ORAL | Status: DC | PRN

## 2012-02-04 NOTE — Telephone Encounter (Signed)
Refill already completed.

## 2012-02-19 ENCOUNTER — Other Ambulatory Visit: Payer: Medicare Other

## 2012-02-19 DIAGNOSIS — B20 Human immunodeficiency virus [HIV] disease: Secondary | ICD-10-CM

## 2012-02-20 LAB — CBC WITH DIFFERENTIAL/PLATELET
Eosinophils Absolute: 0.1 10*3/uL (ref 0.0–0.7)
HCT: 37.8 % — ABNORMAL LOW (ref 39.0–52.0)
Hemoglobin: 13.4 g/dL (ref 13.0–17.0)
Lymphs Abs: 0.8 10*3/uL (ref 0.7–4.0)
MCH: 30.7 pg (ref 26.0–34.0)
Monocytes Absolute: 0.3 10*3/uL (ref 0.1–1.0)
Monocytes Relative: 7 % (ref 3–12)
Neutrophils Relative %: 71 % (ref 43–77)
RBC: 4.37 MIL/uL (ref 4.22–5.81)

## 2012-02-20 LAB — COMPLETE METABOLIC PANEL WITH GFR
ALT: 19 U/L (ref 0–53)
AST: 27 U/L (ref 0–37)
Alkaline Phosphatase: 89 U/L (ref 39–117)
CO2: 26 mEq/L (ref 19–32)
Creat: 0.96 mg/dL (ref 0.50–1.35)
GFR, Est African American: >89 mL/min
Sodium: 139 mEq/L (ref 135–145)
Total Bilirubin: 0.8 mg/dL (ref 0.3–1.2)
Total Protein: 6.6 g/dL (ref 6.0–8.3)

## 2012-02-20 LAB — T-HELPER CELL (CD4) - (RCID CLINIC ONLY)
CD4 % Helper T Cell: 30 % — ABNORMAL LOW (ref 33–55)
CD4 T Cell Abs: 250 uL — ABNORMAL LOW (ref 400–2700)

## 2012-02-20 LAB — HIV-1 RNA QUANT-NO REFLEX-BLD
HIV 1 RNA Quant: <20 copies/mL (ref <20)
HIV-1 RNA Quant, Log: <1.3 {Log} (ref <1.30)

## 2012-03-04 ENCOUNTER — Ambulatory Visit: Payer: Medicare Other | Admitting: Infectious Disease

## 2012-03-08 ENCOUNTER — Other Ambulatory Visit: Payer: Self-pay | Admitting: *Deleted

## 2012-03-08 ENCOUNTER — Ambulatory Visit (INDEPENDENT_AMBULATORY_CARE_PROVIDER_SITE_OTHER): Payer: Medicare Other | Admitting: Infectious Disease

## 2012-03-08 ENCOUNTER — Encounter: Payer: Self-pay | Admitting: Infectious Disease

## 2012-03-08 VITALS — BP 110/68 | HR 52 | Temp 98.1°F | Ht 68.0 in | Wt 151.2 lb

## 2012-03-08 DIAGNOSIS — F411 Generalized anxiety disorder: Secondary | ICD-10-CM

## 2012-03-08 DIAGNOSIS — K219 Gastro-esophageal reflux disease without esophagitis: Secondary | ICD-10-CM

## 2012-03-08 DIAGNOSIS — F419 Anxiety disorder, unspecified: Secondary | ICD-10-CM

## 2012-03-08 DIAGNOSIS — Z0000 Encounter for general adult medical examination without abnormal findings: Secondary | ICD-10-CM

## 2012-03-08 DIAGNOSIS — G4700 Insomnia, unspecified: Secondary | ICD-10-CM

## 2012-03-08 DIAGNOSIS — I10 Essential (primary) hypertension: Secondary | ICD-10-CM

## 2012-03-08 DIAGNOSIS — Z1211 Encounter for screening for malignant neoplasm of colon: Secondary | ICD-10-CM | POA: Insufficient documentation

## 2012-03-08 DIAGNOSIS — B20 Human immunodeficiency virus [HIV] disease: Secondary | ICD-10-CM

## 2012-03-08 DIAGNOSIS — K8689 Other specified diseases of pancreas: Secondary | ICD-10-CM

## 2012-03-08 MED ORDER — CLONAZEPAM 1 MG PO TABS: ORAL_TABLET | ORAL | Status: DC

## 2012-03-08 MED ORDER — OMEPRAZOLE 40 MG PO CPDR: 40.0000 mg | DELAYED_RELEASE_CAPSULE | Freq: Every day | ORAL | Status: DC

## 2012-03-08 NOTE — Assessment & Plan Note (Signed)
Doing well on Prezista Norvir Truvada

## 2012-03-08 NOTE — Assessment & Plan Note (Signed)
Well controlled 

## 2012-03-08 NOTE — Progress Notes (Signed)
  Subjective:    Patient ID: Noah Henderson, male    DOB: 1950-09-25, 61 y.o.   MRN: [id number]  HPI  61 year old with HIV, previously well controlled on viramune XR and truvada this winter with breakthrough viremia and now found to have a K103 mutation changed to prezista norvir and truvada, currently with good virological suppression and stable CD4 count. He claims to be needing more klonopin than just 2mg  in a day and asks for increase to 120 tablets or 60 #2mg . I told him we could write #90 as when I questioned him he stated that that 3 1mg  tablets was the most to take any given day. He also filled out his information for a prescription for pancreatic enzyme replacement. As of upright care physicians he stated his insurance company had pointed him to a particular primary care physician but he did not seem that ensure that physician since his being referred to them. We will therefore refer him to a primary care physician.   Review of Systems  Constitutional: Negative for fever, chills, diaphoresis, activity change, appetite change, fatigue and unexpected weight change.  HENT: Negative for congestion, sore throat, rhinorrhea, sneezing, trouble swallowing and sinus pressure.   Eyes: Negative for photophobia and visual disturbance.  Respiratory: Negative for cough, chest tightness, shortness of breath, wheezing and stridor.   Cardiovascular: Negative for chest pain, palpitations and leg swelling.  Gastrointestinal: Negative for nausea, vomiting, abdominal pain, diarrhea, constipation, blood in stool, abdominal distention and anal bleeding.  Genitourinary: Negative for dysuria, hematuria, flank pain and difficulty urinating.  Musculoskeletal: Negative for myalgias, back pain, joint swelling, arthralgias and gait problem.  Skin: Negative for color change, pallor, rash and wound.  Neurological: Negative for dizziness, tremors, weakness and light-headedness.  Hematological: Negative for adenopathy. Does  not bruise/bleed easily.  Psychiatric/Behavioral: Negative for behavioral problems, confusion, disturbed wake/sleep cycle, dysphoric mood, decreased concentration and agitation.       Objective:   Physical Exam  Constitutional: He is oriented to person, place, and time. He appears well-developed and well-nourished. No distress.  HENT:  Head: Normocephalic and atraumatic.  Mouth/Throat: Oropharynx is clear and moist. No oropharyngeal exudate.  Eyes: Conjunctivae and EOM are normal. Pupils are equal, round, and reactive to light. No scleral icterus.  Neck: Normal range of motion. Neck supple. No JVD present.  Cardiovascular: Normal rate, regular rhythm and normal heart sounds.  Exam reveals no gallop and no friction rub.   No murmur heard. Pulmonary/Chest: Effort normal and breath sounds normal. No respiratory distress. He has no wheezes. He has no rales. He exhibits no tenderness.  Abdominal: He exhibits no distension and no mass. There is no tenderness. There is no rebound and no guarding.  Musculoskeletal: He exhibits no edema and no tenderness.  Lymphadenopathy:    He has no cervical adenopathy.  Neurological: He is alert and oriented to person, place, and time. He has normal reflexes. He exhibits normal muscle tone. Coordination normal.  Skin: Skin is warm and dry. He is not diaphoretic. No erythema. No pallor.  Psychiatric: He has a normal mood and affect. His behavior is normal. Judgment and thought content normal.          Assessment & Plan:  AIDS Doing well on Prezista Norvir Truvada  HTN (hypertension) Well-controlled  PANCREATIC INSUFFICIENCY Continue pancreatic replacement

## 2012-03-08 NOTE — Assessment & Plan Note (Signed)
Continue pancreatic replacement 

## 2012-03-09 ENCOUNTER — Other Ambulatory Visit: Payer: Self-pay | Admitting: *Deleted

## 2012-03-09 DIAGNOSIS — K8689 Other specified diseases of pancreas: Secondary | ICD-10-CM

## 2012-03-09 MED ORDER — PANCRELIPASE (LIP-PROT-AMYL) 24000-76000 UNITS PO CPEP: 24000.0000 [IU] | ORAL_CAPSULE | Freq: Three times a day (TID) | ORAL | Status: DC

## 2012-03-11 ENCOUNTER — Telehealth: Payer: Self-pay | Admitting: *Deleted

## 2012-03-11 NOTE — Telephone Encounter (Signed)
Patient called requesting an Rx for Androgel which is now covered by [employer].  His testosterone was checked 6/12 and was in the normal range of 446.94. Wendall Mola CMA

## 2012-03-17 NOTE — Telephone Encounter (Signed)
Patient notified

## 2012-03-17 NOTE — Telephone Encounter (Signed)
If his testosterone was normal then we SHOULD NOT be prescribing testosterone for him

## 2012-03-30 ENCOUNTER — Telehealth: Payer: Self-pay | Admitting: Licensed Clinical Social Worker

## 2012-03-30 NOTE — Telephone Encounter (Signed)
Patient called wanting a letter to excuse him from jury duty, due to his neuropathy, and occasional diarrhea. He states he can't sit for long periods of time.

## 2012-04-01 NOTE — Telephone Encounter (Signed)
We can write letter for excuse re his neuropathy. I wasn't aware he was having severe diarrhea?

## 2012-04-02 ENCOUNTER — Encounter: Payer: Self-pay | Admitting: Licensed Clinical Social Worker

## 2012-04-05 ENCOUNTER — Other Ambulatory Visit: Payer: Self-pay | Admitting: *Deleted

## 2012-04-05 DIAGNOSIS — I10 Essential (primary) hypertension: Secondary | ICD-10-CM

## 2012-04-05 DIAGNOSIS — F0280 Dementia in other diseases classified elsewhere without behavioral disturbance: Secondary | ICD-10-CM

## 2012-04-05 DIAGNOSIS — R110 Nausea: Secondary | ICD-10-CM

## 2012-04-05 DIAGNOSIS — B20 Human immunodeficiency virus [HIV] disease: Secondary | ICD-10-CM

## 2012-04-05 MED ORDER — ATENOLOL 50 MG PO TABS: 50.0000 mg | ORAL_TABLET | Freq: Every day | ORAL | Status: DC

## 2012-04-05 MED ORDER — AMITRIPTYLINE HCL 50 MG PO TABS: 50.0000 mg | ORAL_TABLET | Freq: Every day | ORAL | Status: DC

## 2012-04-05 MED ORDER — EMTRICITABINE-TENOFOVIR DF 200-300 MG PO TABS: 1.0000 | ORAL_TABLET | Freq: Every day | ORAL | Status: DC

## 2012-04-05 MED ORDER — PROMETHAZINE HCL 25 MG PO TABS: 25.0000 mg | ORAL_TABLET | Freq: Two times a day (BID) | ORAL | Status: DC

## 2012-04-23 ENCOUNTER — Telehealth: Payer: Self-pay

## 2012-04-23 NOTE — Telephone Encounter (Signed)
Patient called and wanted [employer] documents mailed for faxed to him - mailed on 04/23/12 - called patient and advised he needed to get them back as soon as possible - inquired as to why he could not come in, but never received answer - offered bus passes and bridge counseling help if trans portation issues - concerned about mailing or faxing documents as if lost, they may miss the recertification.

## 2012-05-06 ENCOUNTER — Telehealth: Payer: Self-pay | Admitting: *Deleted

## 2012-05-06 DIAGNOSIS — L02222 Furuncle of back [any part, except buttock]: Secondary | ICD-10-CM

## 2012-05-06 MED ORDER — SULFAMETHOXAZOLE-TMP DS 800-160 MG PO TABS: 2.0000 | ORAL_TABLET | Freq: Two times a day (BID) | ORAL | Status: AC

## 2012-05-06 NOTE — Telephone Encounter (Signed)
Ok to give bactrim

## 2012-05-06 NOTE — Telephone Encounter (Signed)
Message left requesting the pt to call [facility] to let Center know if he is having a continuing problem with furuncle on his back diagnosed 11/2011.

## 2012-05-06 NOTE — Telephone Encounter (Signed)
Pt returned called and stated that he is having a recurrence of boil on his back.  Requesting refill.   RN spoke with Dr. Daiva Eves and received order to refill septra rx for the pt.

## 2012-06-07 ENCOUNTER — Other Ambulatory Visit: Payer: Self-pay | Admitting: *Deleted

## 2012-06-07 DIAGNOSIS — R197 Diarrhea, unspecified: Secondary | ICD-10-CM

## 2012-06-07 MED ORDER — DIPHENOXYLATE-ATROPINE 2.5-0.025 MG PO TABS: 1.0000 | ORAL_TABLET | Freq: Two times a day (BID) | ORAL | Status: DC | PRN

## 2012-07-05 ENCOUNTER — Other Ambulatory Visit: Payer: Medicare Other

## 2012-07-05 DIAGNOSIS — B20 Human immunodeficiency virus [HIV] disease: Secondary | ICD-10-CM

## 2012-07-06 LAB — COMPLETE METABOLIC PANEL WITH GFR
ALT: 16 U/L (ref 0–53)
Albumin: 4.2 g/dL (ref 3.5–5.2)
CO2: 25 mEq/L (ref 19–32)
Chloride: 105 mEq/L (ref 96–112)
GFR, Est African American: >89 mL/min
Glucose, Bld: 88 mg/dL (ref 70–99)
Potassium: 4 mEq/L (ref 3.5–5.3)
Sodium: 139 mEq/L (ref 135–145)
Total Bilirubin: 0.9 mg/dL (ref 0.3–1.2)
Total Protein: 6.4 g/dL (ref 6.0–8.3)

## 2012-07-06 LAB — CBC WITH DIFFERENTIAL/PLATELET
Eosinophils Relative: 1 % (ref 0–5)
HCT: 38 % — ABNORMAL LOW (ref 39.0–52.0)
Hemoglobin: 13.9 g/dL (ref 13.0–17.0)
Lymphocytes Relative: 20 % (ref 12–46)
Lymphs Abs: 0.8 10*3/uL (ref 0.7–4.0)
MCV: 82.8 fL (ref 78.0–100.0)
Monocytes Absolute: 0.3 10*3/uL (ref 0.1–1.0)
Monocytes Relative: 7 % (ref 3–12)
Neutro Abs: 2.8 10*3/uL (ref 1.7–7.7)
WBC: 3.9 10*3/uL — ABNORMAL LOW (ref 4.0–10.5)

## 2012-07-06 LAB — RPR

## 2012-07-06 LAB — HIV-1 RNA QUANT-NO REFLEX-BLD: HIV-1 RNA Quant, Log: <1.3 {Log} (ref <1.30)

## 2012-07-06 LAB — LIPID PANEL: LDL Cholesterol: 83 mg/dL (ref 0–99)

## 2012-07-06 LAB — T-HELPER CELL (CD4) - (RCID CLINIC ONLY): CD4 T Cell Abs: 230 uL — ABNORMAL LOW (ref 400–2700)

## 2012-07-07 ENCOUNTER — Other Ambulatory Visit: Payer: Self-pay | Admitting: *Deleted

## 2012-07-07 DIAGNOSIS — K8689 Other specified diseases of pancreas: Secondary | ICD-10-CM

## 2012-07-07 MED ORDER — PANCRELIPASE (LIP-PROT-AMYL) 24000-76000 UNITS PO CPEP: 24000.0000 [IU] | ORAL_CAPSULE | Freq: Three times a day (TID) | ORAL | Status: DC

## 2012-07-08 ENCOUNTER — Other Ambulatory Visit: Payer: Self-pay | Admitting: Licensed Clinical Social Worker

## 2012-07-08 DIAGNOSIS — K8689 Other specified diseases of pancreas: Secondary | ICD-10-CM

## 2012-07-08 MED ORDER — PANCRELIPASE (LIP-PROT-AMYL) 24000-76000 UNITS PO CPEP: 24000.0000 [IU] | ORAL_CAPSULE | Freq: Three times a day (TID) | ORAL | Status: DC

## 2012-07-08 MED ORDER — PANCRELIPASE (LIP-PROT-AMYL) 24000-76000 UNITS PO CPEP: 2.0000 | ORAL_CAPSULE | Freq: Three times a day (TID) | ORAL | Status: DC

## 2012-07-19 ENCOUNTER — Ambulatory Visit: Payer: Medicare Other | Admitting: Infectious Disease

## 2012-07-20 ENCOUNTER — Ambulatory Visit (INDEPENDENT_AMBULATORY_CARE_PROVIDER_SITE_OTHER): Payer: Medicare Other | Admitting: Infectious Disease

## 2012-07-20 ENCOUNTER — Other Ambulatory Visit: Payer: Self-pay | Admitting: *Deleted

## 2012-07-20 ENCOUNTER — Encounter: Payer: Self-pay | Admitting: Infectious Disease

## 2012-07-20 VITALS — BP 122/71 | HR 66 | Temp 97.7°F | Ht 68.0 in | Wt 149.0 lb

## 2012-07-20 DIAGNOSIS — F419 Anxiety disorder, unspecified: Secondary | ICD-10-CM

## 2012-07-20 DIAGNOSIS — G4700 Insomnia, unspecified: Secondary | ICD-10-CM

## 2012-07-20 DIAGNOSIS — B20 Human immunodeficiency virus [HIV] disease: Secondary | ICD-10-CM

## 2012-07-20 DIAGNOSIS — F411 Generalized anxiety disorder: Secondary | ICD-10-CM

## 2012-07-20 DIAGNOSIS — Z23 Encounter for immunization: Secondary | ICD-10-CM

## 2012-07-20 MED ORDER — CLONAZEPAM 1 MG PO TABS: ORAL_TABLET | ORAL | Status: DC

## 2012-07-20 NOTE — Patient Instructions (Addendum)
wE ARE giving you hep A and Hep B today  Then we need you to come back in December for repeat Hep B vaccine  And then at your next visit repeat Hep A and Hep B

## 2012-07-20 NOTE — Progress Notes (Signed)
  Subjective:    Patient ID: Noah Henderson, male    DOB: December 22, 1950, 61 y.o.   MRN: [id number]  HPI  61 year old Caucasian man with HIV, previously well controlled on viramune XR and truvada in  winter with breakthrough viremiafound to have a K103 mutation changed to prezista norvir and truvada, currently with good virological suppression and stable CD4 count. He has no specific complaints today is doing well. Asked for refill of his clonazepam which I have done. He is taking his pancreatic enzyme replacement. He has no other specific complaints.    Review of Systems  Constitutional: Negative for fever, chills, diaphoresis, activity change, appetite change, fatigue and unexpected weight change.  HENT: Negative for congestion, sore throat, rhinorrhea, sneezing, trouble swallowing and sinus pressure.   Eyes: Negative for photophobia and visual disturbance.  Respiratory: Negative for cough, chest tightness, shortness of breath, wheezing and stridor.   Cardiovascular: Negative for chest pain, palpitations and leg swelling.  Gastrointestinal: Negative for nausea, vomiting, abdominal pain, diarrhea, constipation, blood in stool, abdominal distention and anal bleeding.  Genitourinary: Negative for dysuria, hematuria, flank pain and difficulty urinating.  Musculoskeletal: Negative for myalgias, back pain, joint swelling, arthralgias and gait problem.  Skin: Negative for color change, pallor, rash and wound.  Neurological: Negative for dizziness, tremors, weakness and light-headedness.  Hematological: Negative for adenopathy. Does not bruise/bleed easily.  Psychiatric/Behavioral: Negative for behavioral problems, confusion, disturbed wake/sleep cycle, dysphoric mood, decreased concentration and agitation.       Objective:   Physical Exam  Constitutional: He is oriented to person, place, and time. He appears well-developed and well-nourished. No distress.  HENT:  Head: Normocephalic and atraumatic.   Mouth/Throat: Oropharynx is clear and moist. No oropharyngeal exudate.  Eyes: Conjunctivae normal and EOM are normal. No scleral icterus.  Neck: Normal range of motion. Neck supple. No JVD present.  Cardiovascular: Normal rate, regular rhythm and normal heart sounds.  Exam reveals no gallop and no friction rub.   No murmur heard. Pulmonary/Chest: Effort normal and breath sounds normal. No respiratory distress. He has no wheezes. He has no rales. He exhibits no tenderness.  Abdominal: He exhibits no distension and no mass. There is no tenderness. There is no rebound and no guarding.  Musculoskeletal: He exhibits no edema and no tenderness.  Lymphadenopathy:    He has no cervical adenopathy.  Neurological: He is alert and oriented to person, place, and time. He exhibits normal muscle tone. Coordination normal.  Skin: Skin is warm and dry. He is not diaphoretic. No erythema. No pallor.  Psychiatric: He has a normal mood and affect. His behavior is normal. Judgment and thought content normal.          Assessment & Plan:   # 1 HIV: Continue Prezista Norvir Truvada recheck labs in 6 months time.  #2 anxiety continue clonazepam.  #3 pancreatic insufficiency continue pancreatic enzyme replacement  #4 healthcare maintenance repeat hepatitis A and B. vaccinations series

## 2012-08-02 ENCOUNTER — Encounter: Payer: Self-pay | Admitting: Infectious Disease

## 2012-08-23 ENCOUNTER — Ambulatory Visit (INDEPENDENT_AMBULATORY_CARE_PROVIDER_SITE_OTHER): Payer: Medicare Other | Admitting: *Deleted

## 2012-08-23 DIAGNOSIS — Z0000 Encounter for general adult medical examination without abnormal findings: Secondary | ICD-10-CM

## 2012-08-23 DIAGNOSIS — Z23 Encounter for immunization: Secondary | ICD-10-CM

## 2012-09-15 ENCOUNTER — Encounter: Payer: Self-pay | Admitting: Infectious Disease

## 2012-09-18 ENCOUNTER — Encounter: Payer: Self-pay | Admitting: Infectious Disease

## 2012-09-20 ENCOUNTER — Telehealth: Payer: Self-pay | Admitting: Infectious Disease

## 2012-09-20 NOTE — Telephone Encounter (Signed)
Received email from [place] about Noah Henderson wanting his Ensure.  Patient stated he had sent an application through [facility].  No one has seen an application.  I called [employer] about their foundation's Medical Nutrition Program. I spoke with Barbara Cower.  He said they are no longer accepting renewal or new applications for the Ensure.  I will tell [name] to check with [facility] to see if he is eligible for their program.

## 2012-10-05 ENCOUNTER — Other Ambulatory Visit: Payer: Self-pay | Admitting: Licensed Clinical Social Worker

## 2012-10-05 DIAGNOSIS — B20 Human immunodeficiency virus [HIV] disease: Secondary | ICD-10-CM

## 2012-10-05 DIAGNOSIS — R197 Diarrhea, unspecified: Secondary | ICD-10-CM

## 2012-10-05 MED ORDER — RITONAVIR 100 MG PO TABS: 100.0000 mg | ORAL_TABLET | Freq: Every day | ORAL | Status: DC

## 2012-10-05 MED ORDER — DIPHENOXYLATE-ATROPINE 2.5-0.025 MG PO TABS: 1.0000 | ORAL_TABLET | Freq: Two times a day (BID) | ORAL | Status: DC | PRN

## 2012-10-05 MED ORDER — EMTRICITABINE-TENOFOVIR DF 200-300 MG PO TABS: 1.0000 | ORAL_TABLET | Freq: Every day | ORAL | Status: DC

## 2012-10-07 ENCOUNTER — Other Ambulatory Visit: Payer: Self-pay | Admitting: *Deleted

## 2012-10-07 DIAGNOSIS — B20 Human immunodeficiency virus [HIV] disease: Secondary | ICD-10-CM

## 2012-10-07 MED ORDER — DARUNAVIR ETHANOLATE 800 MG PO TABS: 800.0000 mg | ORAL_TABLET | Freq: Every day | ORAL | Status: DC

## 2012-10-18 ENCOUNTER — Encounter: Payer: Self-pay | Admitting: Infectious Disease

## 2012-10-18 ENCOUNTER — Encounter: Payer: Self-pay | Admitting: Licensed Clinical Social Worker

## 2012-11-08 ENCOUNTER — Other Ambulatory Visit: Payer: Self-pay | Admitting: *Deleted

## 2012-11-08 DIAGNOSIS — F0280 Dementia in other diseases classified elsewhere without behavioral disturbance: Secondary | ICD-10-CM

## 2012-11-08 MED ORDER — AMITRIPTYLINE HCL 50 MG PO TABS: 50.0000 mg | ORAL_TABLET | Freq: Every day | ORAL | Status: DC

## 2012-12-06 ENCOUNTER — Other Ambulatory Visit: Payer: Self-pay

## 2012-12-06 DIAGNOSIS — I10 Essential (primary) hypertension: Secondary | ICD-10-CM

## 2012-12-06 MED ORDER — ATENOLOL 50 MG PO TABS: 50.0000 mg | ORAL_TABLET | Freq: Every day | ORAL | Status: DC

## 2012-12-06 MED ORDER — CLONAZEPAM 1 MG PO TABS: ORAL_TABLET | ORAL | Status: DC

## 2012-12-31 ENCOUNTER — Telehealth: Payer: Self-pay | Admitting: *Deleted

## 2012-12-31 NOTE — Telephone Encounter (Signed)
Patient called stating that his insurance will not cover the hepatitis A vaccine he got 07/20/12. Insurance told him it was coded incorrectly. Called patient back and told him I spoke with the office manager and she will review with our billing department and call him back next week. Noah Henderson

## 2013-01-05 NOTE — Telephone Encounter (Signed)
Annice Pih, I have called Mr. Kagel twice and left messages.  I haven't heard from him, in case he calls back. Thanks Asher Muir

## 2013-01-10 ENCOUNTER — Other Ambulatory Visit: Payer: Self-pay | Admitting: Licensed Clinical Social Worker

## 2013-01-10 ENCOUNTER — Encounter: Payer: Self-pay | Admitting: Licensed Clinical Social Worker

## 2013-01-10 DIAGNOSIS — R197 Diarrhea, unspecified: Secondary | ICD-10-CM

## 2013-01-10 MED ORDER — DIPHENOXYLATE-ATROPINE 2.5-0.025 MG PO TABS: 1.0000 | ORAL_TABLET | Freq: Two times a day (BID) | ORAL | Status: DC | PRN

## 2013-02-03 ENCOUNTER — Other Ambulatory Visit: Payer: Medicare Other

## 2013-02-03 DIAGNOSIS — B20 Human immunodeficiency virus [HIV] disease: Secondary | ICD-10-CM

## 2013-02-03 LAB — CBC WITH DIFFERENTIAL/PLATELET
Eosinophils Absolute: 0.1 10*3/uL (ref 0.0–0.7)
HCT: 38.4 % — ABNORMAL LOW (ref 39.0–52.0)
Hemoglobin: 13.8 g/dL (ref 13.0–17.0)
Lymphs Abs: 1 10*3/uL (ref 0.7–4.0)
MCH: 30.3 pg (ref 26.0–34.0)
MCHC: 35.9 g/dL (ref 30.0–36.0)
Monocytes Absolute: 0.3 10*3/uL (ref 0.1–1.0)
Monocytes Relative: 6 % (ref 3–12)
Neutrophils Relative %: 72 % (ref 43–77)
RBC: 4.56 MIL/uL (ref 4.22–5.81)

## 2013-02-03 LAB — COMPLETE METABOLIC PANEL WITH GFR
ALT: 17 U/L (ref 0–53)
AST: 27 U/L (ref 0–37)
Alkaline Phosphatase: 80 U/L (ref 39–117)
Creat: 0.92 mg/dL (ref 0.50–1.35)
GFR, Est African American: >89 mL/min
Sodium: 140 mEq/L (ref 135–145)
Total Bilirubin: 0.9 mg/dL (ref 0.3–1.2)
Total Protein: 6.5 g/dL (ref 6.0–8.3)

## 2013-02-03 LAB — LIPID PANEL
Cholesterol: 136 mg/dL (ref 0–200)
LDL Cholesterol: 73 mg/dL (ref 0–99)
Total CHOL/HDL Ratio: 3.7 Ratio
Triglycerides: 128 mg/dL (ref <150)
VLDL: 26 mg/dL (ref 0–40)

## 2013-02-04 LAB — T-HELPER CELL (CD4) - (RCID CLINIC ONLY): CD4 % Helper T Cell: 21 % — ABNORMAL LOW (ref 33–55)

## 2013-02-07 ENCOUNTER — Encounter: Payer: Self-pay | Admitting: Infectious Disease

## 2013-02-07 LAB — HIV-1 RNA QUANT-NO REFLEX-BLD
HIV 1 RNA Quant: <20 copies/mL (ref <20)
HIV-1 RNA Quant, Log: <1.3 {Log} (ref <1.30)

## 2013-02-09 ENCOUNTER — Other Ambulatory Visit: Payer: Self-pay | Admitting: *Deleted

## 2013-02-09 DIAGNOSIS — K8689 Other specified diseases of pancreas: Secondary | ICD-10-CM

## 2013-02-09 MED ORDER — PANCRELIPASE (LIP-PROT-AMYL) 24000-76000 UNITS PO CPEP: 2.0000 | ORAL_CAPSULE | Freq: Three times a day (TID) | ORAL | Status: DC

## 2013-02-17 ENCOUNTER — Ambulatory Visit (INDEPENDENT_AMBULATORY_CARE_PROVIDER_SITE_OTHER): Payer: Medicare Other | Admitting: Infectious Disease

## 2013-02-17 ENCOUNTER — Encounter: Payer: Self-pay | Admitting: Infectious Disease

## 2013-02-17 VITALS — BP 123/71 | HR 64 | Temp 97.6°F | Ht 68.0 in | Wt 142.0 lb

## 2013-02-17 DIAGNOSIS — R64 Cachexia: Secondary | ICD-10-CM

## 2013-02-17 DIAGNOSIS — E881 Lipodystrophy, not elsewhere classified: Secondary | ICD-10-CM

## 2013-02-17 DIAGNOSIS — B20 Human immunodeficiency virus [HIV] disease: Secondary | ICD-10-CM

## 2013-02-17 DIAGNOSIS — D696 Thrombocytopenia, unspecified: Secondary | ICD-10-CM | POA: Insufficient documentation

## 2013-02-17 DIAGNOSIS — F411 Generalized anxiety disorder: Secondary | ICD-10-CM

## 2013-02-17 DIAGNOSIS — F0390 Unspecified dementia without behavioral disturbance: Secondary | ICD-10-CM | POA: Insufficient documentation

## 2013-02-17 DIAGNOSIS — K859 Acute pancreatitis without necrosis or infection, unspecified: Secondary | ICD-10-CM | POA: Insufficient documentation

## 2013-02-17 DIAGNOSIS — D6959 Other secondary thrombocytopenia: Secondary | ICD-10-CM

## 2013-02-17 DIAGNOSIS — F419 Anxiety disorder, unspecified: Secondary | ICD-10-CM

## 2013-02-17 DIAGNOSIS — G4700 Insomnia, unspecified: Secondary | ICD-10-CM

## 2013-02-17 MED ORDER — CLONAZEPAM 2 MG PO TABS: 2.0000 mg | ORAL_TABLET | Freq: Two times a day (BID) | ORAL | Status: DC | PRN

## 2013-02-17 NOTE — Progress Notes (Signed)
  Subjective:    Patient ID: Noah Henderson, male    DOB: 04/09/1951, 62 y.o.   MRN: [id number]  HPI   62 year old Caucasian man with HIV, previously well controlled on viramune XR and truvada with breakthrough viremiafound to have a K103 mutation changed to prezista norvir and truvada, currently with good virological suppression and stable CD4 count.   He still hs problems with insomnia for which he takes klonopin 2mg  qhs and in day before his "nap."  I agreed to escalate his dose IF he was willing to also see Bernette Redbird to consider that he is suffering from anxiety which would be the proper indication of his klonopin.  He was unhappy about being hit with bill for hep a that he states medicare would not pay for it because it was  Listed as a "presciption medicine." He in the meantime he will refuse pneumovax and other vaccines until he is convinced they will be covered by medicare.    Review of Systems  Constitutional: Negative for fever, chills, diaphoresis, activity change, appetite change, fatigue and unexpected weight change.  HENT: Negative for congestion, sore throat, rhinorrhea, sneezing, trouble swallowing and sinus pressure.   Eyes: Negative for photophobia and visual disturbance.  Respiratory: Negative for cough, chest tightness, shortness of breath, wheezing and stridor.   Cardiovascular: Negative for chest pain, palpitations and leg swelling.  Gastrointestinal: Negative for nausea, vomiting, abdominal pain, diarrhea, constipation, blood in stool, abdominal distention and anal bleeding.  Genitourinary: Negative for dysuria, hematuria, flank pain and difficulty urinating.  Musculoskeletal: Negative for myalgias, back pain, joint swelling, arthralgias and gait problem.  Skin: Negative for color change, pallor, rash and wound.  Neurological: Negative for dizziness, tremors, weakness and light-headedness.  Hematological: Negative for adenopathy. Does not bruise/bleed easily.   Psychiatric/Behavioral: Negative for behavioral problems, confusion, sleep disturbance, dysphoric mood, decreased concentration and agitation.       Objective:   Physical Exam  Constitutional: He is oriented to person, place, and time. He appears well-developed and well-nourished. No distress.  HENT:  Head: Normocephalic and atraumatic.  Mouth/Throat: Oropharynx is clear and moist. No oropharyngeal exudate.  Eyes: Conjunctivae and EOM are normal. No scleral icterus.  Neck: Normal range of motion. Neck supple. No JVD present.  Cardiovascular: Normal rate, regular rhythm and normal heart sounds.  Exam reveals no gallop and no friction rub.   No murmur heard. Pulmonary/Chest: Effort normal and breath sounds normal. No respiratory distress. He has no wheezes. He has no rales. He exhibits no tenderness.  Abdominal: He exhibits no distension and no mass. There is no tenderness. There is no rebound and no guarding.  Musculoskeletal: He exhibits no edema and no tenderness.  Lymphadenopathy:    He has no cervical adenopathy.  Neurological: He is alert and oriented to person, place, and time. He exhibits normal muscle tone. Coordination normal.  Skin: Skin is warm and dry. He is not diaphoretic. No erythema. No pallor.  Psychiatric: His behavior is normal. Judgment and thought content normal. His mood appears anxious.          Assessment & Plan:   # 1 HIV: Continue Prezista Norvir Truvada recheck labs in 4 months time.  #2 anxiety continue clonazepam. 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

## 2013-03-08 ENCOUNTER — Other Ambulatory Visit: Payer: Self-pay | Admitting: Licensed Clinical Social Worker

## 2013-03-08 DIAGNOSIS — F419 Anxiety disorder, unspecified: Secondary | ICD-10-CM

## 2013-03-08 MED ORDER — CLONAZEPAM 2 MG PO TABS: 2.0000 mg | ORAL_TABLET | Freq: Two times a day (BID) | ORAL | Status: DC | PRN

## 2013-03-09 ENCOUNTER — Other Ambulatory Visit: Payer: Self-pay | Admitting: Licensed Clinical Social Worker

## 2013-03-09 DIAGNOSIS — K219 Gastro-esophageal reflux disease without esophagitis: Secondary | ICD-10-CM

## 2013-03-09 MED ORDER — OMEPRAZOLE 40 MG PO CPDR: 40.0000 mg | DELAYED_RELEASE_CAPSULE | Freq: Every day | ORAL | Status: DC

## 2013-04-04 ENCOUNTER — Other Ambulatory Visit: Payer: Self-pay | Admitting: *Deleted

## 2013-04-04 DIAGNOSIS — F0280 Dementia in other diseases classified elsewhere without behavioral disturbance: Secondary | ICD-10-CM

## 2013-04-04 DIAGNOSIS — B20 Human immunodeficiency virus [HIV] disease: Secondary | ICD-10-CM

## 2013-04-04 MED ORDER — AMITRIPTYLINE HCL 50 MG PO TABS: 50.0000 mg | ORAL_TABLET | Freq: Every day | ORAL | Status: DC

## 2013-04-04 MED ORDER — EMTRICITABINE-TENOFOVIR DF 200-300 MG PO TABS: 1.0000 | ORAL_TABLET | Freq: Every day | ORAL | Status: DC

## 2013-04-05 ENCOUNTER — Other Ambulatory Visit: Payer: Self-pay | Admitting: *Deleted

## 2013-04-05 ENCOUNTER — Other Ambulatory Visit: Payer: Self-pay | Admitting: Licensed Clinical Social Worker

## 2013-04-05 DIAGNOSIS — B20 Human immunodeficiency virus [HIV] disease: Secondary | ICD-10-CM

## 2013-04-05 DIAGNOSIS — R197 Diarrhea, unspecified: Secondary | ICD-10-CM

## 2013-04-05 DIAGNOSIS — R110 Nausea: Secondary | ICD-10-CM

## 2013-04-05 MED ORDER — PROMETHAZINE HCL 25 MG PO TABS: 25.0000 mg | ORAL_TABLET | Freq: Two times a day (BID) | ORAL | Status: DC

## 2013-04-05 MED ORDER — RITONAVIR 100 MG PO TABS: 100.0000 mg | ORAL_TABLET | Freq: Every day | ORAL | Status: DC

## 2013-04-05 MED ORDER — DARUNAVIR ETHANOLATE 800 MG PO TABS: 800.0000 mg | ORAL_TABLET | Freq: Every day | ORAL | Status: DC

## 2013-04-05 MED ORDER — DIPHENOXYLATE-ATROPINE 2.5-0.025 MG PO TABS: 1.0000 | ORAL_TABLET | Freq: Two times a day (BID) | ORAL | Status: DC | PRN

## 2013-06-06 ENCOUNTER — Ambulatory Visit: Payer: Medicare Other

## 2013-06-06 ENCOUNTER — Other Ambulatory Visit (HOSPITAL_COMMUNITY)
Admission: RE | Admit: 2013-06-06 | Discharge: 2013-06-06 | Disposition: A | Discharge status: Home / Self Care | Payer: Medicare Other | Source: Ambulatory Visit | Attending: Internal Medicine | Admitting: Internal Medicine

## 2013-06-06 ENCOUNTER — Other Ambulatory Visit: Payer: Medicare Other

## 2013-06-06 DIAGNOSIS — Z113 Encounter for screening for infections with a predominantly sexual mode of transmission: Secondary | ICD-10-CM

## 2013-06-06 DIAGNOSIS — B20 Human immunodeficiency virus [HIV] disease: Secondary | ICD-10-CM

## 2013-06-06 LAB — COMPLETE METABOLIC PANEL WITH GFR
ALT: 16 U/L (ref 0–53)
Albumin: 4.5 g/dL (ref 3.5–5.2)
BUN: 12 mg/dL (ref 6–23)
CO2: 25 mEq/L (ref 19–32)
Calcium: 9.3 mg/dL (ref 8.4–10.5)
Chloride: 103 mEq/L (ref 96–112)
Creat: 0.9 mg/dL (ref 0.50–1.35)
GFR, Est African American: >89 mL/min
Potassium: 4 mEq/L (ref 3.5–5.3)

## 2013-06-06 LAB — CBC WITH DIFFERENTIAL/PLATELET
Basophils Relative: 0 % (ref 0–1)
Eosinophils Absolute: 0.1 10*3/uL (ref 0.0–0.7)
HCT: 37.6 % — ABNORMAL LOW (ref 39.0–52.0)
Hemoglobin: 13.8 g/dL (ref 13.0–17.0)
Lymphs Abs: 0.8 10*3/uL (ref 0.7–4.0)
MCH: 29.7 pg (ref 26.0–34.0)
MCHC: 36.7 g/dL — ABNORMAL HIGH (ref 30.0–36.0)
MCV: 81 fL (ref 78.0–100.0)
Monocytes Absolute: 0.3 10*3/uL (ref 0.1–1.0)
Monocytes Relative: 6 % (ref 3–12)

## 2013-06-07 LAB — T-HELPER CELL (CD4) - (RCID CLINIC ONLY)
CD4 % Helper T Cell: 33 % (ref 33–55)
CD4 T Cell Abs: 230 /uL — ABNORMAL LOW (ref 400–2700)

## 2013-06-10 ENCOUNTER — Encounter: Payer: Self-pay | Admitting: Infectious Disease

## 2013-06-20 ENCOUNTER — Encounter: Payer: Self-pay | Admitting: Infectious Disease

## 2013-06-20 ENCOUNTER — Ambulatory Visit (INDEPENDENT_AMBULATORY_CARE_PROVIDER_SITE_OTHER): Payer: Medicare Other | Admitting: Infectious Disease

## 2013-06-20 VITALS — BP 114/70 | HR 70 | Temp 98.3°F | Ht 68.0 in | Wt 143.2 lb

## 2013-06-20 DIAGNOSIS — Z23 Encounter for immunization: Secondary | ICD-10-CM

## 2013-06-20 DIAGNOSIS — K8689 Other specified diseases of pancreas: Secondary | ICD-10-CM

## 2013-06-20 DIAGNOSIS — D696 Thrombocytopenia, unspecified: Secondary | ICD-10-CM

## 2013-06-20 DIAGNOSIS — F4323 Adjustment disorder with mixed anxiety and depressed mood: Secondary | ICD-10-CM

## 2013-06-20 DIAGNOSIS — B20 Human immunodeficiency virus [HIV] disease: Secondary | ICD-10-CM

## 2013-06-20 DIAGNOSIS — F0280 Dementia in other diseases classified elsewhere without behavioral disturbance: Secondary | ICD-10-CM

## 2013-06-20 NOTE — Progress Notes (Signed)
  Subjective:    Patient ID: Noah Henderson, male    DOB: 03-11-1951, 62 y.o.   MRN: [id number]  HPI   62 year old Caucasian man with HIV, previously well controlled on viramune XR and truvada with breakthrough viremiafound to have a K103 mutation changed to prezista norvir and truvada, currently with good virological suppression and stable CD4 count.      Review of Systems  Constitutional: Negative for fever, chills, diaphoresis, activity change, appetite change, fatigue and unexpected weight change.  HENT: Negative for congestion, sore throat, rhinorrhea, sneezing, trouble swallowing and sinus pressure.   Eyes: Negative for photophobia and visual disturbance.  Respiratory: Negative for cough, chest tightness, shortness of breath, wheezing and stridor.   Cardiovascular: Negative for chest pain, palpitations and leg swelling.  Gastrointestinal: Negative for nausea, vomiting, abdominal pain, diarrhea, constipation, blood in stool, abdominal distention and anal bleeding.  Genitourinary: Negative for dysuria, hematuria, flank pain and difficulty urinating.  Musculoskeletal: Negative for myalgias, back pain, joint swelling, arthralgias and gait problem.  Skin: Negative for color change, pallor, rash and wound.  Neurological: Negative for dizziness, tremors, weakness and light-headedness.  Hematological: Negative for adenopathy. Does not bruise/bleed easily.  Psychiatric/Behavioral: Negative for behavioral problems, confusion, sleep disturbance, dysphoric mood, decreased concentration and agitation.       Objective:   Physical Exam  Constitutional: He is oriented to person, place, and time. He appears well-developed and well-nourished. No distress.  HENT:  Head: Normocephalic and atraumatic.  Mouth/Throat: Oropharynx is clear and moist. No oropharyngeal exudate.  Eyes: Conjunctivae and EOM are normal. No scleral icterus.  Neck: Normal range of motion. Neck supple. No JVD present.   Cardiovascular: Normal rate, regular rhythm and normal heart sounds.  Exam reveals no gallop and no friction rub.   No murmur heard. Pulmonary/Chest: Effort normal and breath sounds normal. No respiratory distress. He has no wheezes. He has no rales. He exhibits no tenderness.  Abdominal: He exhibits no distension and no mass. There is no tenderness. There is no rebound and no guarding.  Musculoskeletal: He exhibits no edema and no tenderness.  Lymphadenopathy:    He has no cervical adenopathy.  Neurological: He is alert and oriented to person, place, and time. He exhibits normal muscle tone. Coordination normal.  Skin: Skin is warm and dry. He is not diaphoretic. No erythema. No pallor.  Psychiatric: His behavior is normal. Judgment and thought content normal. His mood appears anxious.          Assessment & Plan:   # 1 HIV: Continue Prezista Norvir Truvada recheck labs in 4 months time.  #2 anxiety continue clonazepam. 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

## 2013-07-11 ENCOUNTER — Other Ambulatory Visit: Payer: Self-pay | Admitting: *Deleted

## 2013-07-11 DIAGNOSIS — I10 Essential (primary) hypertension: Secondary | ICD-10-CM

## 2013-07-11 DIAGNOSIS — R197 Diarrhea, unspecified: Secondary | ICD-10-CM

## 2013-07-11 DIAGNOSIS — R110 Nausea: Secondary | ICD-10-CM

## 2013-07-11 MED ORDER — PROMETHAZINE HCL 25 MG PO TABS: 25.0000 mg | ORAL_TABLET | Freq: Two times a day (BID) | ORAL | Status: DC

## 2013-07-11 MED ORDER — DIPHENOXYLATE-ATROPINE 2.5-0.025 MG PO TABS: 1.0000 | ORAL_TABLET | Freq: Two times a day (BID) | ORAL | Status: DC | PRN

## 2013-07-11 MED ORDER — ATENOLOL 50 MG PO TABS: 50.0000 mg | ORAL_TABLET | Freq: Every day | ORAL | Status: DC

## 2013-07-12 ENCOUNTER — Other Ambulatory Visit: Payer: Self-pay | Admitting: *Deleted

## 2013-07-12 DIAGNOSIS — B20 Human immunodeficiency virus [HIV] disease: Secondary | ICD-10-CM

## 2013-07-12 MED ORDER — RITONAVIR 100 MG PO TABS: 100.0000 mg | ORAL_TABLET | Freq: Every day | ORAL | Status: DC

## 2013-07-12 MED ORDER — EMTRICITABINE-TENOFOVIR DF 200-300 MG PO TABS: 1.0000 | ORAL_TABLET | Freq: Every day | ORAL | Status: DC

## 2013-07-12 MED ORDER — DARUNAVIR ETHANOLATE 800 MG PO TABS: 800.0000 mg | ORAL_TABLET | Freq: Every day | ORAL | Status: DC

## 2013-07-17 ENCOUNTER — Encounter: Payer: Self-pay | Admitting: Infectious Disease

## 2013-07-18 ENCOUNTER — Other Ambulatory Visit: Payer: Self-pay | Admitting: Licensed Clinical Social Worker

## 2013-07-18 DIAGNOSIS — F419 Anxiety disorder, unspecified: Secondary | ICD-10-CM

## 2013-07-18 MED ORDER — CLONAZEPAM 2 MG PO TABS: 2.0000 mg | ORAL_TABLET | Freq: Two times a day (BID) | ORAL | Status: DC | PRN

## 2013-08-08 ENCOUNTER — Encounter: Payer: Self-pay | Admitting: Infectious Disease

## 2013-08-08 ENCOUNTER — Other Ambulatory Visit: Payer: Self-pay | Admitting: *Deleted

## 2013-08-08 DIAGNOSIS — K8689 Other specified diseases of pancreas: Secondary | ICD-10-CM

## 2013-08-08 MED ORDER — PANCRELIPASE (LIP-PROT-AMYL) 24000-76000 UNITS PO CPEP: 2.0000 | ORAL_CAPSULE | Freq: Three times a day (TID) | ORAL | Status: DC

## 2013-09-05 ENCOUNTER — Other Ambulatory Visit: Payer: Medicare Other

## 2013-09-05 DIAGNOSIS — B20 Human immunodeficiency virus [HIV] disease: Secondary | ICD-10-CM

## 2013-09-05 DIAGNOSIS — F0280 Dementia in other diseases classified elsewhere without behavioral disturbance: Secondary | ICD-10-CM

## 2013-09-05 LAB — CBC WITH DIFFERENTIAL/PLATELET
Basophils Relative: 0 % (ref 0–1)
Eosinophils Absolute: 0.1 10*3/uL (ref 0.0–0.7)
Eosinophils Relative: 2 % (ref 0–5)
HCT: 37.5 % — ABNORMAL LOW (ref 39.0–52.0)
Hemoglobin: 13.3 g/dL (ref 13.0–17.0)
MCH: 27.9 pg (ref 26.0–34.0)
MCHC: 35.5 g/dL (ref 30.0–36.0)
MCV: 78.6 fL (ref 78.0–100.0)
Monocytes Absolute: 0.3 10*3/uL (ref 0.1–1.0)
Monocytes Relative: 6 % (ref 3–12)

## 2013-09-05 LAB — COMPLETE METABOLIC PANEL WITH GFR
AST: 25 U/L (ref 0–37)
Albumin: 4.4 g/dL (ref 3.5–5.2)
Alkaline Phosphatase: 93 U/L (ref 39–117)
BUN: 14 mg/dL (ref 6–23)
Creat: 0.93 mg/dL (ref 0.50–1.35)
GFR, Est Non African American: 89 mL/min
Glucose, Bld: 89 mg/dL (ref 70–99)
Total Bilirubin: 0.9 mg/dL (ref 0.3–1.2)

## 2013-09-06 LAB — T-HELPER CELL (CD4) - (RCID CLINIC ONLY)
CD4 % Helper T Cell: 29 % — ABNORMAL LOW (ref 33–55)
CD4 T Cell Abs: 210 /uL — ABNORMAL LOW (ref 400–2700)

## 2013-09-06 LAB — HIV-1 RNA QUANT-NO REFLEX-BLD: HIV 1 RNA Quant: <20 copies/mL (ref <20)

## 2013-09-13 ENCOUNTER — Ambulatory Visit: Payer: Medicare Other | Admitting: Infectious Disease

## 2013-09-13 ENCOUNTER — Encounter: Payer: Self-pay | Admitting: Infectious Disease

## 2013-09-19 ENCOUNTER — Ambulatory Visit: Payer: Medicare Other | Admitting: Infectious Disease

## 2013-09-22 DIAGNOSIS — M1990 Unspecified osteoarthritis, unspecified site: Secondary | ICD-10-CM

## 2013-09-22 HISTORY — DX: Unspecified osteoarthritis, unspecified site: M19.90

## 2013-10-04 ENCOUNTER — Encounter: Payer: Self-pay | Admitting: Infectious Disease

## 2013-10-04 NOTE — Telephone Encounter (Signed)
OK for pt to have next appt w/ Dr. Daiva EvesVan Dam in April 2015.

## 2013-10-07 ENCOUNTER — Encounter: Payer: Self-pay | Admitting: Infectious Disease

## 2013-10-10 ENCOUNTER — Encounter: Payer: Self-pay | Admitting: Infectious Disease

## 2013-10-10 ENCOUNTER — Other Ambulatory Visit: Payer: Self-pay | Admitting: *Deleted

## 2013-10-10 DIAGNOSIS — R197 Diarrhea, unspecified: Secondary | ICD-10-CM

## 2013-10-10 DIAGNOSIS — F419 Anxiety disorder, unspecified: Secondary | ICD-10-CM

## 2013-10-10 MED ORDER — CLONAZEPAM 2 MG PO TABS: 2.0000 mg | ORAL_TABLET | Freq: Two times a day (BID) | ORAL | Status: DC | PRN

## 2013-10-10 MED ORDER — DIPHENOXYLATE-ATROPINE 2.5-0.025 MG PO TABS: 1.0000 | ORAL_TABLET | Freq: Two times a day (BID) | ORAL | Status: DC | PRN

## 2013-10-10 NOTE — Telephone Encounter (Signed)
Left message for pt to come for follow-up appt w/ Dr. Daiva EvesVan Dam for this Friday, Jan. 23, 2015.  Pt started new regimen and needs f/u sooner than April.

## 2013-10-14 ENCOUNTER — Ambulatory Visit: Payer: Medicare Other | Admitting: Infectious Disease

## 2013-10-19 ENCOUNTER — Other Ambulatory Visit: Payer: Self-pay | Admitting: *Deleted

## 2013-10-19 DIAGNOSIS — B20 Human immunodeficiency virus [HIV] disease: Secondary | ICD-10-CM

## 2013-10-19 MED ORDER — RITONAVIR 100 MG PO TABS: 100.0000 mg | ORAL_TABLET | Freq: Every day | ORAL | Status: DC

## 2013-10-19 MED ORDER — DARUNAVIR ETHANOLATE 800 MG PO TABS: 800.0000 mg | ORAL_TABLET | Freq: Every day | ORAL | Status: DC

## 2013-10-19 MED ORDER — EMTRICITABINE-TENOFOVIR DF 200-300 MG PO TABS: 1.0000 | ORAL_TABLET | Freq: Every day | ORAL | Status: DC

## 2013-11-07 ENCOUNTER — Encounter: Payer: Self-pay | Admitting: *Deleted

## 2013-11-07 DIAGNOSIS — R110 Nausea: Secondary | ICD-10-CM

## 2013-11-07 DIAGNOSIS — F0280 Dementia in other diseases classified elsewhere without behavioral disturbance: Secondary | ICD-10-CM

## 2013-11-07 MED ORDER — PROMETHAZINE HCL 25 MG PO TABS: 25.0000 mg | ORAL_TABLET | Freq: Two times a day (BID) | ORAL | Status: DC | PRN

## 2013-11-07 MED ORDER — AMITRIPTYLINE HCL 50 MG PO TABS
50.0000 mg | ORAL_TABLET | Freq: Every day | ORAL | Status: DC
Start: 2013-11-07 — End: 2014-06-02

## 2013-11-09 ENCOUNTER — Telehealth: Payer: Self-pay | Admitting: *Deleted

## 2013-11-09 DIAGNOSIS — B20 Human immunodeficiency virus [HIV] disease: Secondary | ICD-10-CM

## 2013-11-09 MED ORDER — DARUNAVIR ETHANOLATE 800 MG PO TABS: 800.0000 mg | ORAL_TABLET | Freq: Every day | ORAL | Status: DC

## 2013-11-09 MED ORDER — RITONAVIR 100 MG PO TABS: 100.0000 mg | ORAL_TABLET | Freq: Every day | ORAL | Status: DC

## 2013-11-09 MED ORDER — EMTRICITABINE-TENOFOVIR DF 200-300 MG PO TABS: 1.0000 | ORAL_TABLET | Freq: Every day | ORAL | Status: DC

## 2013-11-09 NOTE — Telephone Encounter (Signed)
ADAP application approved 

## 2013-11-14 NOTE — Telephone Encounter (Signed)
Error

## 2014-01-09 ENCOUNTER — Other Ambulatory Visit: Payer: Self-pay | Admitting: *Deleted

## 2014-01-09 DIAGNOSIS — I10 Essential (primary) hypertension: Secondary | ICD-10-CM

## 2014-01-09 DIAGNOSIS — R197 Diarrhea, unspecified: Secondary | ICD-10-CM

## 2014-01-09 MED ORDER — ATENOLOL 50 MG PO TABS: 50.0000 mg | ORAL_TABLET | Freq: Every day | ORAL | Status: DC

## 2014-01-09 MED ORDER — DIPHENOXYLATE-ATROPINE 2.5-0.025 MG PO TABS
1.0000 | ORAL_TABLET | Freq: Two times a day (BID) | ORAL | Status: DC | PRN
Start: 2014-01-09 — End: 2014-04-01

## 2014-01-11 ENCOUNTER — Other Ambulatory Visit: Payer: Self-pay | Admitting: *Deleted

## 2014-01-11 ENCOUNTER — Ambulatory Visit (INDEPENDENT_AMBULATORY_CARE_PROVIDER_SITE_OTHER): Payer: Medicare Other | Admitting: Infectious Disease

## 2014-01-11 ENCOUNTER — Ambulatory Visit: Payer: Medicare Other | Admitting: Infectious Disease

## 2014-01-11 ENCOUNTER — Other Ambulatory Visit (HOSPITAL_COMMUNITY)
Admission: RE | Admit: 2014-01-11 | Discharge: 2014-01-11 | Disposition: A | Discharge status: Home / Self Care | Payer: Medicare Other | Source: Ambulatory Visit | Attending: Infectious Disease | Admitting: Infectious Disease

## 2014-01-11 ENCOUNTER — Encounter: Payer: Self-pay | Admitting: Infectious Disease

## 2014-01-11 VITALS — BP 117/69 | HR 62 | Temp 98.0°F | Wt 151.5 lb

## 2014-01-11 DIAGNOSIS — Z21 Asymptomatic human immunodeficiency virus [HIV] infection status: Secondary | ICD-10-CM

## 2014-01-11 DIAGNOSIS — I10 Essential (primary) hypertension: Secondary | ICD-10-CM

## 2014-01-11 DIAGNOSIS — Z113 Encounter for screening for infections with a predominantly sexual mode of transmission: Secondary | ICD-10-CM | POA: Insufficient documentation

## 2014-01-11 DIAGNOSIS — F411 Generalized anxiety disorder: Secondary | ICD-10-CM

## 2014-01-11 DIAGNOSIS — D696 Thrombocytopenia, unspecified: Secondary | ICD-10-CM

## 2014-01-11 DIAGNOSIS — K8689 Other specified diseases of pancreas: Secondary | ICD-10-CM

## 2014-01-11 DIAGNOSIS — B20 Human immunodeficiency virus [HIV] disease: Secondary | ICD-10-CM

## 2014-01-11 DIAGNOSIS — Z79899 Other long term (current) drug therapy: Secondary | ICD-10-CM

## 2014-01-11 LAB — CBC WITH DIFFERENTIAL/PLATELET
BASOS ABS: 0.1 10*3/uL (ref 0.0–0.1)
BASOS PCT: 1 % (ref 0–1)
EOS ABS: 0.1 10*3/uL (ref 0.0–0.7)
EOS PCT: 2 % (ref 0–5)
HCT: 37.3 % — ABNORMAL LOW (ref 39.0–52.0)
Hemoglobin: 13.6 g/dL (ref 13.0–17.0)
Lymphocytes Relative: 20 % (ref 12–46)
Lymphs Abs: 1.2 10*3/uL (ref 0.7–4.0)
MCH: 28.1 pg (ref 26.0–34.0)
MCHC: 36.5 g/dL — ABNORMAL HIGH (ref 30.0–36.0)
MCV: 77.1 fL — AB (ref 78.0–100.0)
MONO ABS: 0.5 10*3/uL (ref 0.1–1.0)
MONOS PCT: 9 % (ref 3–12)
NEUTROS ABS: 4.1 10*3/uL (ref 1.7–7.7)
Neutrophils Relative %: 68 % (ref 43–77)
Platelets: 79 10*3/uL — ABNORMAL LOW (ref 150–400)
RBC: 4.84 MIL/uL (ref 4.22–5.81)
RDW: 15.2 % (ref 11.5–15.5)
WBC: 6 10*3/uL (ref 4.0–10.5)

## 2014-01-11 LAB — LIPID PANEL
CHOL/HDL RATIO: 4.2 ratio
Cholesterol: 146 mg/dL (ref 0–200)
HDL: 35 mg/dL — AB (ref >39)
LDL CALC: 90 mg/dL (ref 0–99)
TRIGLYCERIDES: 103 mg/dL (ref <150)
VLDL: 21 mg/dL (ref 0–40)

## 2014-01-11 LAB — COMPLETE METABOLIC PANEL WITH GFR
ALK PHOS: 89 U/L (ref 39–117)
ALT: 11 U/L (ref 0–53)
AST: 18 U/L (ref 0–37)
Albumin: 4.3 g/dL (ref 3.5–5.2)
BILIRUBIN TOTAL: 0.8 mg/dL (ref 0.2–1.2)
BUN: 9 mg/dL (ref 6–23)
CO2: 25 mEq/L (ref 19–32)
CREATININE: 0.8 mg/dL (ref 0.50–1.35)
Calcium: 9.3 mg/dL (ref 8.4–10.5)
Chloride: 105 mEq/L (ref 96–112)
GFR, Est African American: >89 mL/min
GLUCOSE: 60 mg/dL — AB (ref 70–99)
Potassium: 3.8 mEq/L (ref 3.5–5.3)
Sodium: 138 mEq/L (ref 135–145)
TOTAL PROTEIN: 6.8 g/dL (ref 6.0–8.3)

## 2014-01-11 MED ORDER — ATENOLOL 50 MG PO TABS: 50.0000 mg | ORAL_TABLET | Freq: Every day | ORAL | Status: DC

## 2014-01-11 MED ORDER — DARUNAVIR-COBICISTAT 800-150 MG PO TABS
1.0000 | ORAL_TABLET | Freq: Every day | ORAL | Status: DC
Start: 2014-01-11 — End: 2014-04-24

## 2014-01-11 NOTE — Progress Notes (Signed)
  Subjective:    Patient ID: Noah Henderson, male    DOB: 10-09-1950, 63 y.o.   MRN: [id number]  HPI   63 year old Caucasian man with HIV, previously well controlled on viramune XR and truvada with breakthrough viremiafound to have a K103 mutation changed to prezista norvir and truvada, currently with good virological suppression and stable CD4 count.      Review of Systems  Constitutional: Negative for fever, chills, diaphoresis, activity change, appetite change, fatigue and unexpected weight change.  HENT: Negative for congestion, rhinorrhea, sinus pressure, sneezing, sore throat and trouble swallowing.   Eyes: Negative for photophobia and visual disturbance.  Respiratory: Negative for cough, chest tightness, shortness of breath, wheezing and stridor.   Cardiovascular: Negative for chest pain, palpitations and leg swelling.  Gastrointestinal: Negative for nausea, vomiting, abdominal pain, diarrhea, constipation, blood in stool, abdominal distention and anal bleeding.  Genitourinary: Negative for dysuria, hematuria, flank pain and difficulty urinating.  Musculoskeletal: Negative for arthralgias, back pain, gait problem, joint swelling and myalgias.  Skin: Negative for color change, pallor, rash and wound.  Neurological: Negative for dizziness, tremors, weakness and light-headedness.  Hematological: Negative for adenopathy. Does not bruise/bleed easily.  Psychiatric/Behavioral: Negative for behavioral problems, confusion, sleep disturbance, dysphoric mood, decreased concentration and agitation.       Objective:   Physical Exam  Constitutional: He is oriented to person, place, and time. He appears well-developed and well-nourished. No distress.  HENT:  Head: Normocephalic and atraumatic.  Mouth/Throat: Oropharynx is clear and moist. No oropharyngeal exudate.  Eyes: Conjunctivae and EOM are normal. No scleral icterus.  Neck: Normal range of motion. Neck supple. No JVD present.   Cardiovascular: Normal rate, regular rhythm and normal heart sounds.  Exam reveals no gallop and no friction rub.   No murmur heard. Pulmonary/Chest: Effort normal and breath sounds normal. No respiratory distress. He has no wheezes. He has no rales. He exhibits no tenderness.  Abdominal: He exhibits no distension and no mass. There is no tenderness. There is no rebound and no guarding.  Musculoskeletal: He exhibits no edema and no tenderness.  Lymphadenopathy:    He has no cervical adenopathy.  Neurological: He is alert and oriented to person, place, and time. He exhibits normal muscle tone. Coordination normal.  Skin: Skin is warm and dry. He is not diaphoretic. No erythema. No pallor.  Psychiatric: His behavior is normal. Judgment and thought content normal. His mood appears anxious.          Assessment & Plan:   # 1 HIV: change to Prezcobix and Truvada recheck labs in 6 months time.  #2 anxiety continue clonazepam. 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

## 2014-01-11 NOTE — Patient Instructions (Addendum)
Labs today  And make appt in July to see Dr Daiva EvesVan Dam and make sure that you see Tish with ADAP renewal  Your new regimen is  PREZCOBIX 800/150MG  PINK PILL ONCE DAILY  WITH  TRUVADA BLUE PILL ONCE DAILY

## 2014-01-12 LAB — RPR

## 2014-01-12 LAB — T-HELPER CELL (CD4) - (RCID CLINIC ONLY)
CD4 % Helper T Cell: 29 % — ABNORMAL LOW (ref 33–55)
CD4 T CELL ABS: 350 /uL — AB (ref 400–2700)

## 2014-01-12 LAB — URINE CYTOLOGY ANCILLARY ONLY
Chlamydia: NEGATIVE
Neisseria Gonorrhea: NEGATIVE

## 2014-01-13 ENCOUNTER — Other Ambulatory Visit: Payer: Self-pay | Admitting: Licensed Clinical Social Worker

## 2014-01-13 DIAGNOSIS — I10 Essential (primary) hypertension: Secondary | ICD-10-CM

## 2014-01-13 LAB — HIV-1 RNA QUANT-NO REFLEX-BLD: HIV 1 RNA Quant: <20 copies/mL (ref <20)

## 2014-01-13 MED ORDER — ATENOLOL 50 MG PO TABS: 50.0000 mg | ORAL_TABLET | Freq: Every day | ORAL | Status: DC

## 2014-01-25 ENCOUNTER — Encounter: Payer: Self-pay | Admitting: Infectious Disease

## 2014-02-04 ENCOUNTER — Other Ambulatory Visit: Payer: Self-pay | Admitting: Infectious Disease

## 2014-02-07 ENCOUNTER — Telehealth: Payer: Self-pay | Admitting: *Deleted

## 2014-02-07 ENCOUNTER — Other Ambulatory Visit: Payer: Self-pay | Admitting: *Deleted

## 2014-02-07 DIAGNOSIS — F419 Anxiety disorder, unspecified: Secondary | ICD-10-CM

## 2014-02-07 MED ORDER — CLONAZEPAM 2 MG PO TABS: 2.0000 mg | ORAL_TABLET | Freq: Two times a day (BID) | ORAL | Status: DC | PRN

## 2014-02-07 NOTE — Telephone Encounter (Signed)
#  60 tablets with refills #4

## 2014-02-07 NOTE — Telephone Encounter (Signed)
Rx called to pharmacy

## 2014-02-07 NOTE — Telephone Encounter (Signed)
He should have a standard script in epic. I normally give 4 refills

## 2014-02-07 NOTE — Telephone Encounter (Signed)
Please advise about number of tablets and refills for clonazepam.  Will call it in to pharmacy on your approval.

## 2014-03-03 ENCOUNTER — Other Ambulatory Visit: Payer: Self-pay | Admitting: Infectious Disease

## 2014-03-06 ENCOUNTER — Other Ambulatory Visit: Payer: Self-pay | Admitting: *Deleted

## 2014-03-06 DIAGNOSIS — K8689 Other specified diseases of pancreas: Secondary | ICD-10-CM

## 2014-03-06 DIAGNOSIS — R110 Nausea: Secondary | ICD-10-CM

## 2014-03-06 DIAGNOSIS — K219 Gastro-esophageal reflux disease without esophagitis: Secondary | ICD-10-CM

## 2014-03-06 MED ORDER — OMEPRAZOLE 40 MG PO CPDR: 40.0000 mg | DELAYED_RELEASE_CAPSULE | Freq: Every day | ORAL | Status: DC

## 2014-03-06 MED ORDER — PROMETHAZINE HCL 25 MG PO TABS: 25.0000 mg | ORAL_TABLET | Freq: Two times a day (BID) | ORAL | Status: DC | PRN

## 2014-03-06 MED ORDER — PANCRELIPASE (LIP-PROT-AMYL) 24000-76000 UNITS PO CPEP: 2.0000 | ORAL_CAPSULE | Freq: Three times a day (TID) | ORAL | Status: DC

## 2014-03-10 ENCOUNTER — Ambulatory Visit (INDEPENDENT_AMBULATORY_CARE_PROVIDER_SITE_OTHER): Payer: Self-pay | Admitting: *Deleted

## 2014-03-10 VITALS — BP 106/60 | HR 59 | Temp 97.8°F | Resp 16 | Wt 148.0 lb

## 2014-03-10 DIAGNOSIS — B20 Human immunodeficiency virus [HIV] disease: Secondary | ICD-10-CM

## 2014-03-10 LAB — LIPID PANEL
CHOL/HDL RATIO: 3.7 ratio
Cholesterol: 139 mg/dL (ref 0–200)
HDL: 38 mg/dL — AB (ref >39)
LDL Cholesterol: 81 mg/dL (ref 0–99)
TRIGLYCERIDES: 100 mg/dL (ref <150)
VLDL: 20 mg/dL (ref 0–40)

## 2014-03-10 NOTE — Progress Notes (Signed)
   Subjective:    Patient ID: Noah LeepBarry K Henderson, male    DOB: 1951-04-01, 63 y.o.   MRN: [id number]  HPI    Review of Systems  Constitutional: Negative.   HENT: Negative.   Eyes: Negative.   Respiratory: Negative.   Cardiovascular: Negative.   Gastrointestinal: Negative.   Genitourinary: Negative.   Musculoskeletal: Negative.   Skin: Negative.   Neurological: Negative.   Psychiatric/Behavioral: Negative.        Objective:   Physical Exam  Constitutional: He is oriented to person, place, and time.  HENT:  Mouth/Throat: Oropharynx is clear and moist.  Eyes: No scleral icterus.  Cardiovascular: Normal rate, regular rhythm, normal heart sounds and intact distal pulses.   Pulmonary/Chest: Effort normal and breath sounds normal.  Abdominal: Soft. Bowel sounds are normal.  Musculoskeletal: He exhibits no edema.  Lymphadenopathy:    He has no cervical adenopathy.  Neurological: He is alert and oriented to person, place, and time.  Skin: Skin is warm and dry.  Psychiatric: He has a normal mood and affect.          Assessment & Plan:  Noah Henderson is here for screening for the Reprieve study. Informed consent was obtained after he had time to read the consent and we discussed the study. He has never taken any statins or been told he had any heart problems except for hypertension which he takes atenolol for. He has occasional numbness in his hands and feet. He has noticed wasting of his muscles and used to take ensure to help put on weight. He has never smoked or had any other cardiac risk factors. He was diagnosed in 1995, but did not go for treatment until 2000. He says [facility]. We will schedule an entry visit once it is determined if he is eligible. His cardiac risk score is borderline and will depend on what his lipids are that were drawn today.

## 2014-03-13 ENCOUNTER — Telehealth: Payer: Self-pay | Admitting: *Deleted

## 2014-03-13 NOTE — Telephone Encounter (Signed)
Call from [facility] lab; the HIV antibody test was unable to be done due to quantity not sufficient. Noah MolaJacqueline Cockerham

## 2014-03-27 ENCOUNTER — Ambulatory Visit (INDEPENDENT_AMBULATORY_CARE_PROVIDER_SITE_OTHER): Payer: Self-pay | Admitting: *Deleted

## 2014-03-27 DIAGNOSIS — B20 Human immunodeficiency virus [HIV] disease: Secondary | ICD-10-CM

## 2014-03-27 DIAGNOSIS — Z006 Encounter for examination for normal comparison and control in clinical research program: Secondary | ICD-10-CM

## 2014-03-27 NOTE — Progress Notes (Signed)
Noah Henderson is here to redraw HIV antibody and reflex for study. He denies any new complaints. We will plan to enroll him on Reprieve on 04/06/14. He signed the new V 2.1 consent today.

## 2014-03-28 LAB — HIV 1/2 CONFIRMATION
HIV 1 ANTIBODY: POSITIVE — AB
HIV 2 AB: NEGATIVE

## 2014-03-28 LAB — HIV ANTIBODY (ROUTINE TESTING W REFLEX): HIV 1&2 Ab, 4th Generation: REACTIVE — AB

## 2014-03-31 ENCOUNTER — Ambulatory Visit (INDEPENDENT_AMBULATORY_CARE_PROVIDER_SITE_OTHER): Payer: Self-pay | Admitting: *Deleted

## 2014-03-31 VITALS — BP 112/69 | HR 65 | Temp 98.1°F | Resp 16 | Wt 147.8 lb

## 2014-03-31 DIAGNOSIS — B20 Human immunodeficiency virus [HIV] disease: Secondary | ICD-10-CM

## 2014-03-31 DIAGNOSIS — Z006 Encounter for examination for normal comparison and control in clinical research program: Secondary | ICD-10-CM

## 2014-03-31 NOTE — Progress Notes (Signed)
Noah Henderson enrolled on the Reprieve study today. Eligibility was verified prior to enrollment. He denies any new complaints or problems currently. He was randomized to take pitavastatin /placebo 4mg  po daily and plans to start the med today. He was counseled on side effects, dosing, adherence and when to call for problems. If he develops any rash or unexplained muscle aches he will stop the medication and call us. He will return in 1 month for followup.

## 2014-04-01 ENCOUNTER — Other Ambulatory Visit: Payer: Self-pay | Admitting: Infectious Disease

## 2014-04-03 ENCOUNTER — Other Ambulatory Visit: Payer: Self-pay | Admitting: *Deleted

## 2014-04-03 MED ORDER — DIPHENOXYLATE-ATROPINE 2.5-0.025 MG PO TABS: ORAL_TABLET | ORAL | Status: DC

## 2014-04-10 ENCOUNTER — Ambulatory Visit: Payer: Medicare Other

## 2014-04-10 ENCOUNTER — Encounter: Payer: Self-pay | Admitting: Infectious Disease

## 2014-04-10 ENCOUNTER — Ambulatory Visit (INDEPENDENT_AMBULATORY_CARE_PROVIDER_SITE_OTHER): Payer: Medicare Other | Admitting: Infectious Disease

## 2014-04-10 VITALS — BP 136/78 | HR 73 | Temp 98.0°F | Ht 67.0 in | Wt 148.8 lb

## 2014-04-10 DIAGNOSIS — K8689 Other specified diseases of pancreas: Secondary | ICD-10-CM

## 2014-04-10 DIAGNOSIS — D696 Thrombocytopenia, unspecified: Secondary | ICD-10-CM

## 2014-04-10 DIAGNOSIS — B20 Human immunodeficiency virus [HIV] disease: Secondary | ICD-10-CM

## 2014-04-10 DIAGNOSIS — F411 Generalized anxiety disorder: Secondary | ICD-10-CM

## 2014-04-10 NOTE — Progress Notes (Signed)
  Subjective:    Patient ID: Noah LeepBarry K Digeronimo, male    DOB: 03/17/1951, 63 y.o.   MRN: [id number]  HPI   63 year old Caucasian man with HIV, previously well controlled on viramune XR and truvada with breakthrough viremiafound to have a K103 mutation changed to prezista norvir and truvada, currently with good virological suppression and stable CD4 count.   Lab Results  Component Value Date   HIV1RNAQUANT <20 01/11/2014   Lab Results  Component Value Date   CD4TABS 350* 01/11/2014   CD4TABS 210* 09/05/2013   CD4TABS 230* 06/06/2013   He is enrolled in the REPRIEVE trial     Review of Systems  Constitutional: Negative for fever, chills, diaphoresis, activity change, appetite change, fatigue and unexpected weight change.  HENT: Negative for congestion, rhinorrhea, sinus pressure, sneezing, sore throat and trouble swallowing.   Eyes: Negative for photophobia and visual disturbance.  Respiratory: Negative for cough, chest tightness, shortness of breath, wheezing and stridor.   Cardiovascular: Negative for chest pain, palpitations and leg swelling.  Gastrointestinal: Negative for nausea, vomiting, abdominal pain, diarrhea, constipation, blood in stool, abdominal distention and anal bleeding.  Genitourinary: Negative for dysuria, hematuria, flank pain and difficulty urinating.  Musculoskeletal: Negative for arthralgias, back pain, gait problem, joint swelling and myalgias.  Skin: Negative for color change, pallor, rash and wound.  Neurological: Negative for dizziness, tremors, weakness and light-headedness.  Hematological: Negative for adenopathy. Does not bruise/bleed easily.  Psychiatric/Behavioral: Negative for behavioral problems, confusion, sleep disturbance, dysphoric mood, decreased concentration and agitation.       Objective:   Physical Exam  Constitutional: He is oriented to person, place, and time. He appears well-developed and well-nourished. No distress.  HENT:  Head:  Normocephalic and atraumatic.  Mouth/Throat: Oropharynx is clear and moist. No oropharyngeal exudate.  Eyes: Conjunctivae and EOM are normal. No scleral icterus.  Neck: Normal range of motion. Neck supple. No JVD present.  Cardiovascular: Normal rate, regular rhythm and normal heart sounds.  Exam reveals no gallop and no friction rub.   No murmur heard. Pulmonary/Chest: Effort normal and breath sounds normal. No respiratory distress. He has no wheezes. He has no rales. He exhibits no tenderness.  Abdominal: He exhibits no distension and no mass. There is no tenderness. There is no rebound and no guarding.  Musculoskeletal: He exhibits no edema and no tenderness.  Lymphadenopathy:    He has no cervical adenopathy.  Neurological: He is alert and oriented to person, place, and time. He exhibits normal muscle tone. Coordination normal.  Skin: Skin is warm and dry. He is not diaphoretic. No erythema. No pallor.  Psychiatric: His behavior is normal. Judgment and thought content normal. His mood appears anxious.          Assessment & Plan:   # 1 HIV: continue Prezcobix and Truvada recheck labs in 4 months time.  #2 anxiety continue clonazepam.  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

## 2014-04-12 ENCOUNTER — Ambulatory Visit: Payer: Medicare Other | Admitting: Infectious Disease

## 2014-04-12 ENCOUNTER — Ambulatory Visit: Payer: Medicare Other

## 2014-04-14 ENCOUNTER — Encounter: Payer: Self-pay | Admitting: Infectious Disease

## 2014-04-24 ENCOUNTER — Other Ambulatory Visit: Payer: Self-pay | Admitting: *Deleted

## 2014-04-24 DIAGNOSIS — Z79899 Other long term (current) drug therapy: Secondary | ICD-10-CM

## 2014-04-24 DIAGNOSIS — B20 Human immunodeficiency virus [HIV] disease: Secondary | ICD-10-CM

## 2014-04-24 DIAGNOSIS — K8689 Other specified diseases of pancreas: Secondary | ICD-10-CM

## 2014-04-24 DIAGNOSIS — Z21 Asymptomatic human immunodeficiency virus [HIV] infection status: Secondary | ICD-10-CM

## 2014-04-24 DIAGNOSIS — D696 Thrombocytopenia, unspecified: Secondary | ICD-10-CM

## 2014-04-24 DIAGNOSIS — Z113 Encounter for screening for infections with a predominantly sexual mode of transmission: Secondary | ICD-10-CM

## 2014-04-24 DIAGNOSIS — F411 Generalized anxiety disorder: Secondary | ICD-10-CM

## 2014-04-24 MED ORDER — EMTRICITABINE-TENOFOVIR DF 200-300 MG PO TABS: 1.0000 | ORAL_TABLET | Freq: Every day | ORAL | Status: DC

## 2014-04-24 MED ORDER — DARUNAVIR-COBICISTAT 800-150 MG PO TABS: 1.0000 | ORAL_TABLET | Freq: Every day | ORAL | Status: DC

## 2014-05-03 ENCOUNTER — Ambulatory Visit (INDEPENDENT_AMBULATORY_CARE_PROVIDER_SITE_OTHER): Payer: Medicare Other | Admitting: *Deleted

## 2014-05-03 VITALS — BP 111/73 | HR 66 | Temp 98.1°F | Resp 16 | Wt 147.2 lb

## 2014-05-03 DIAGNOSIS — Z006 Encounter for examination for normal comparison and control in clinical research program: Secondary | ICD-10-CM

## 2014-05-03 DIAGNOSIS — B20 Human immunodeficiency virus [HIV] disease: Secondary | ICD-10-CM

## 2014-05-03 DIAGNOSIS — Z21 Asymptomatic human immunodeficiency virus [HIV] infection status: Secondary | ICD-10-CM

## 2014-05-03 LAB — COMPREHENSIVE METABOLIC PANEL
ALK PHOS: 63 U/L (ref 39–117)
ALT: 15 U/L (ref 0–53)
AST: 22 U/L (ref 0–37)
Albumin: 4.1 g/dL (ref 3.5–5.2)
BUN: 12 mg/dL (ref 6–23)
CHLORIDE: 106 meq/L (ref 96–112)
CO2: 28 mEq/L (ref 19–32)
Calcium: 9.3 mg/dL (ref 8.4–10.5)
Creat: 1.02 mg/dL (ref 0.50–1.35)
Glucose, Bld: 83 mg/dL (ref 70–99)
Potassium: 3.7 mEq/L (ref 3.5–5.3)
SODIUM: 141 meq/L (ref 135–145)
Total Bilirubin: 0.8 mg/dL (ref 0.2–1.2)
Total Protein: 6.7 g/dL (ref 6.0–8.3)

## 2014-05-03 NOTE — Progress Notes (Signed)
Noah Henderson is here for his 1 month followup after starting the pitavastain/placebo. He says he hasn't noticed any side effects from the medication. He denies any new problems, meds or concerns. He will return in November for the next study visit.

## 2014-05-06 ENCOUNTER — Other Ambulatory Visit: Payer: Self-pay | Admitting: Infectious Disease

## 2014-05-08 ENCOUNTER — Encounter: Payer: Self-pay | Admitting: Infectious Disease

## 2014-05-09 ENCOUNTER — Other Ambulatory Visit: Payer: Self-pay | Admitting: *Deleted

## 2014-05-12 ENCOUNTER — Encounter: Payer: Self-pay | Admitting: Infectious Disease

## 2014-06-02 ENCOUNTER — Other Ambulatory Visit: Payer: Self-pay | Admitting: Infectious Disease

## 2014-06-19 ENCOUNTER — Encounter: Payer: Self-pay | Admitting: Infectious Disease

## 2014-07-07 ENCOUNTER — Encounter: Payer: Self-pay | Admitting: Infectious Disease

## 2014-07-12 ENCOUNTER — Encounter: Payer: Self-pay | Admitting: Infectious Disease

## 2014-07-13 ENCOUNTER — Encounter: Payer: Self-pay | Admitting: Infectious Disease

## 2014-07-17 ENCOUNTER — Encounter: Payer: Self-pay | Admitting: Infectious Disease

## 2014-07-19 ENCOUNTER — Other Ambulatory Visit: Payer: Self-pay | Admitting: *Deleted

## 2014-07-19 DIAGNOSIS — Z21 Asymptomatic human immunodeficiency virus [HIV] infection status: Secondary | ICD-10-CM

## 2014-07-19 DIAGNOSIS — Z113 Encounter for screening for infections with a predominantly sexual mode of transmission: Secondary | ICD-10-CM

## 2014-07-19 DIAGNOSIS — B20 Human immunodeficiency virus [HIV] disease: Secondary | ICD-10-CM

## 2014-07-19 DIAGNOSIS — K8689 Other specified diseases of pancreas: Secondary | ICD-10-CM

## 2014-07-28 ENCOUNTER — Other Ambulatory Visit: Payer: Self-pay | Admitting: Infectious Disease

## 2014-07-31 ENCOUNTER — Other Ambulatory Visit: Payer: Self-pay | Admitting: *Deleted

## 2014-07-31 DIAGNOSIS — R110 Nausea: Secondary | ICD-10-CM

## 2014-07-31 DIAGNOSIS — F419 Anxiety disorder, unspecified: Secondary | ICD-10-CM

## 2014-07-31 MED ORDER — CLONAZEPAM 2 MG PO TABS: 2.0000 mg | ORAL_TABLET | Freq: Two times a day (BID) | ORAL | Status: DC | PRN

## 2014-07-31 MED ORDER — PROMETHAZINE HCL 25 MG PO TABS: 25.0000 mg | ORAL_TABLET | Freq: Two times a day (BID) | ORAL | Status: DC | PRN

## 2014-07-31 MED ORDER — AMITRIPTYLINE HCL 50 MG PO TABS: ORAL_TABLET | ORAL | Status: DC

## 2014-08-02 ENCOUNTER — Other Ambulatory Visit: Payer: Medicare Other

## 2014-08-02 ENCOUNTER — Encounter: Payer: Self-pay | Admitting: *Deleted

## 2014-08-02 DIAGNOSIS — B20 Human immunodeficiency virus [HIV] disease: Secondary | ICD-10-CM

## 2014-08-02 LAB — CBC WITH DIFFERENTIAL/PLATELET
BASOS ABS: 0 10*3/uL (ref 0.0–0.1)
BASOS PCT: 0 % (ref 0–1)
Eosinophils Absolute: 0.1 10*3/uL (ref 0.0–0.7)
Eosinophils Relative: 2 % (ref 0–5)
HCT: 36 % — ABNORMAL LOW (ref 39.0–52.0)
HEMOGLOBIN: 12.9 g/dL — AB (ref 13.0–17.0)
Lymphocytes Relative: 13 % (ref 12–46)
Lymphs Abs: 0.5 10*3/uL — ABNORMAL LOW (ref 0.7–4.0)
MCH: 28.8 pg (ref 26.0–34.0)
MCHC: 35.8 g/dL (ref 30.0–36.0)
MCV: 80.4 fL (ref 78.0–100.0)
MONOS PCT: 9 % (ref 3–12)
Monocytes Absolute: 0.4 10*3/uL (ref 0.1–1.0)
NEUTROS ABS: 3 10*3/uL (ref 1.7–7.7)
Neutrophils Relative %: 76 % (ref 43–77)
Platelets: 51 10*3/uL — ABNORMAL LOW (ref 150–400)
RBC: 4.48 MIL/uL (ref 4.22–5.81)
RDW: 14.9 % (ref 11.5–15.5)
WBC: 3.9 10*3/uL — ABNORMAL LOW (ref 4.0–10.5)

## 2014-08-02 LAB — LIPID PANEL
Cholesterol: 107 mg/dL (ref 0–200)
HDL: 42 mg/dL (ref >39)
LDL CALC: 51 mg/dL (ref 0–99)
TRIGLYCERIDES: 68 mg/dL (ref <150)
Total CHOL/HDL Ratio: 2.5 Ratio
VLDL: 14 mg/dL (ref 0–40)

## 2014-08-02 LAB — COMPLETE METABOLIC PANEL WITH GFR
ALT: 15 U/L (ref 0–53)
AST: 23 U/L (ref 0–37)
Albumin: 4.1 g/dL (ref 3.5–5.2)
Alkaline Phosphatase: 76 U/L (ref 39–117)
BUN: 12 mg/dL (ref 6–23)
CO2: 27 mEq/L (ref 19–32)
CREATININE: 0.92 mg/dL (ref 0.50–1.35)
Calcium: 9.4 mg/dL (ref 8.4–10.5)
Chloride: 105 mEq/L (ref 96–112)
GFR, Est African American: >89 mL/min
GFR, Est Non African American: 89 mL/min
Glucose, Bld: 93 mg/dL (ref 70–99)
POTASSIUM: 3.9 meq/L (ref 3.5–5.3)
Sodium: 140 mEq/L (ref 135–145)
Total Bilirubin: 1 mg/dL (ref 0.2–1.2)
Total Protein: 6.8 g/dL (ref 6.0–8.3)

## 2014-08-02 LAB — RPR

## 2014-08-03 LAB — T-HELPER CELL (CD4) - (RCID CLINIC ONLY)
CD4 % Helper T Cell: 31 % — ABNORMAL LOW (ref 33–55)
CD4 T CELL ABS: 150 /uL — AB (ref 400–2700)

## 2014-08-03 LAB — HIV-1 RNA QUANT-NO REFLEX-BLD

## 2014-08-09 LAB — HLA B*5701: HLA-B 5701 W/RFLX HLA-B HIGH: NEGATIVE

## 2014-08-16 ENCOUNTER — Ambulatory Visit (INDEPENDENT_AMBULATORY_CARE_PROVIDER_SITE_OTHER): Payer: Medicare Other | Admitting: Infectious Disease

## 2014-08-16 ENCOUNTER — Encounter: Payer: Self-pay | Admitting: Infectious Disease

## 2014-08-16 VITALS — BP 124/80 | HR 62 | Temp 97.9°F | Wt 145.0 lb

## 2014-08-16 DIAGNOSIS — R64 Cachexia: Secondary | ICD-10-CM

## 2014-08-16 DIAGNOSIS — F4323 Adjustment disorder with mixed anxiety and depressed mood: Secondary | ICD-10-CM

## 2014-08-16 DIAGNOSIS — D696 Thrombocytopenia, unspecified: Secondary | ICD-10-CM

## 2014-08-16 DIAGNOSIS — E881 Lipodystrophy, not elsewhere classified: Secondary | ICD-10-CM

## 2014-08-16 DIAGNOSIS — K868 Other specified diseases of pancreas: Secondary | ICD-10-CM

## 2014-08-16 DIAGNOSIS — F0280 Dementia in other diseases classified elsewhere without behavioral disturbance: Secondary | ICD-10-CM

## 2014-08-16 DIAGNOSIS — K8689 Other specified diseases of pancreas: Secondary | ICD-10-CM

## 2014-08-16 DIAGNOSIS — G609 Hereditary and idiopathic neuropathy, unspecified: Secondary | ICD-10-CM

## 2014-08-16 DIAGNOSIS — E88A Wasting disease (syndrome) due to underlying condition: Secondary | ICD-10-CM

## 2014-08-16 DIAGNOSIS — K219 Gastro-esophageal reflux disease without esophagitis: Secondary | ICD-10-CM

## 2014-08-16 DIAGNOSIS — B20 Human immunodeficiency virus [HIV] disease: Secondary | ICD-10-CM

## 2014-08-16 NOTE — Patient Instructions (Signed)
Thanks for doing such a great job taking care of yourself and we will make sure there is no gap with ADAP renewal this time  Happy Thanksgiving!

## 2014-08-16 NOTE — Progress Notes (Signed)
  Subjective:    Patient ID: Noah LeepBarry K Thilges, male    DOB: 1951/09/01, 63 y.o.   MRN: [id number]  HPI   63 year old Caucasian man with HIV, previously well controlled on viramune XR and truvada with breakthrough viremiafound to have a K103 mutation changed to Prezcobix and truvada, currently with good virological suppression and stable CD4 count, though missed several weeks due to a mixup with the ADAP application when the patient's license had expired and he had to require a new one  Lab Results  Component Value Date   HIV1RNAQUANT <20 08/02/2014   Lab Results  Component Value Date   CD4TABS 150* 08/02/2014   CD4TABS 350* 01/11/2014   CD4TABS 210* 09/05/2013   He is enrolled in the REPRIEVE trial     Review of Systems  Constitutional: Negative for fever, chills, diaphoresis, activity change, appetite change, fatigue and unexpected weight change.  HENT: Negative for congestion, rhinorrhea, sinus pressure, sneezing, sore throat and trouble swallowing.   Eyes: Negative for photophobia and visual disturbance.  Respiratory: Negative for cough, chest tightness, shortness of breath, wheezing and stridor.   Cardiovascular: Negative for chest pain, palpitations and leg swelling.  Gastrointestinal: Negative for nausea, vomiting, abdominal pain, diarrhea, constipation, blood in stool, abdominal distention and anal bleeding.  Genitourinary: Negative for dysuria, hematuria, flank pain and difficulty urinating.  Musculoskeletal: Negative for myalgias, back pain, joint swelling, arthralgias and gait problem.  Skin: Negative for color change, pallor, rash and wound.  Neurological: Negative for dizziness, tremors, weakness and light-headedness.  Hematological: Negative for adenopathy. Does not bruise/bleed easily.  Psychiatric/Behavioral: Negative for behavioral problems, confusion, sleep disturbance, dysphoric mood, decreased concentration and agitation.       Objective:   Physical Exam   Constitutional: He is oriented to person, place, and time. No distress.  HENT:  Head: Normocephalic and atraumatic.  Mouth/Throat: Oropharynx is clear and moist. No oropharyngeal exudate.  Eyes: Conjunctivae and EOM are normal. No scleral icterus.  Neck: Normal range of motion. Neck supple. No JVD present.  Cardiovascular: Normal rate.   Pulmonary/Chest: Effort normal and breath sounds normal. No respiratory distress. He has no wheezes.  Abdominal: Soft. He exhibits no distension.  Musculoskeletal: He exhibits no edema or tenderness.  Lymphadenopathy:    He has no cervical adenopathy.  Neurological: He is alert and oriented to person, place, and time. He exhibits normal muscle tone. Coordination normal.  Skin: Skin is warm and dry. He is not diaphoretic. No erythema. No pallor.  Psychiatric: He has a normal mood and affect. His behavior is normal. Judgment and thought content normal.          Assessment & Plan:   # 1 HIV: continue Prezcobix and Truvada recheck labs in 6 months time. I spent greater than 25 minutes with the patient including greater than 50% of time in face to face counsel of the patient and in coordination of their care.   #2 anxiety continue clonazepam, tox screen in future to ensure he is taking meds  #3 pancreatic insufficiency continue pancreatic enzyme replacement  #4 GERD: on ppi

## 2014-10-09 ENCOUNTER — Encounter: Payer: Self-pay | Admitting: Infectious Disease

## 2014-10-09 ENCOUNTER — Encounter: Payer: Self-pay | Admitting: *Deleted

## 2014-10-15 ENCOUNTER — Encounter: Payer: Self-pay | Admitting: Infectious Disease

## 2014-10-16 ENCOUNTER — Ambulatory Visit: Payer: Medicare Other

## 2014-11-22 ENCOUNTER — Other Ambulatory Visit: Payer: Self-pay | Admitting: *Deleted

## 2014-11-22 DIAGNOSIS — Z21 Asymptomatic human immunodeficiency virus [HIV] infection status: Secondary | ICD-10-CM

## 2014-11-22 DIAGNOSIS — B20 Human immunodeficiency virus [HIV] disease: Secondary | ICD-10-CM

## 2014-11-22 DIAGNOSIS — Z113 Encounter for screening for infections with a predominantly sexual mode of transmission: Secondary | ICD-10-CM

## 2014-11-22 MED ORDER — EMTRICITABINE-TENOFOVIR DF 200-300 MG PO TABS: 1.0000 | ORAL_TABLET | Freq: Every day | ORAL | Status: DC

## 2014-11-22 MED ORDER — DARUNAVIR-COBICISTAT 800-150 MG PO TABS: 1.0000 | ORAL_TABLET | Freq: Every day | ORAL | Status: DC

## 2014-11-24 ENCOUNTER — Other Ambulatory Visit: Payer: Medicare Other

## 2014-11-25 ENCOUNTER — Other Ambulatory Visit: Payer: Self-pay | Admitting: Infectious Disease

## 2014-11-27 ENCOUNTER — Other Ambulatory Visit: Payer: Medicare Other

## 2014-11-27 DIAGNOSIS — B20 Human immunodeficiency virus [HIV] disease: Secondary | ICD-10-CM

## 2014-11-27 LAB — COMPLETE METABOLIC PANEL WITH GFR
ALBUMIN: 4.2 g/dL (ref 3.5–5.2)
ALK PHOS: 67 U/L (ref 39–117)
ALT: 12 U/L (ref 0–53)
AST: 20 U/L (ref 0–37)
BUN: 10 mg/dL (ref 6–23)
CO2: 27 mEq/L (ref 19–32)
Calcium: 8.8 mg/dL (ref 8.4–10.5)
Chloride: 101 mEq/L (ref 96–112)
Creat: 1.01 mg/dL (ref 0.50–1.35)
GFR, Est African American: >89 mL/min
GFR, Est Non African American: 80 mL/min
Glucose, Bld: 81 mg/dL (ref 70–99)
POTASSIUM: 3.5 meq/L (ref 3.5–5.3)
SODIUM: 136 meq/L (ref 135–145)
TOTAL PROTEIN: 6.6 g/dL (ref 6.0–8.3)
Total Bilirubin: 1.4 mg/dL — ABNORMAL HIGH (ref 0.2–1.2)

## 2014-11-28 LAB — CBC WITH DIFFERENTIAL/PLATELET
BASOS PCT: 0 % (ref 0–1)
Basophils Absolute: 0 10*3/uL (ref 0.0–0.1)
EOS ABS: 0.1 10*3/uL (ref 0.0–0.7)
Eosinophils Relative: 2 % (ref 0–5)
HCT: 36.9 % — ABNORMAL LOW (ref 39.0–52.0)
Hemoglobin: 13 g/dL (ref 13.0–17.0)
Lymphocytes Relative: 13 % (ref 12–46)
Lymphs Abs: 0.5 10*3/uL — ABNORMAL LOW (ref 0.7–4.0)
MCH: 28.6 pg (ref 26.0–34.0)
MCHC: 35.2 g/dL (ref 30.0–36.0)
MCV: 81.3 fL (ref 78.0–100.0)
MONO ABS: 0.4 10*3/uL (ref 0.1–1.0)
MPV: 9.5 fL (ref 8.6–12.4)
Monocytes Relative: 9 % (ref 3–12)
NEUTROS PCT: 76 % (ref 43–77)
Neutro Abs: 3.1 10*3/uL (ref 1.7–7.7)
Platelets: 52 10*3/uL — ABNORMAL LOW (ref 150–400)
RBC: 4.54 MIL/uL (ref 4.22–5.81)
RDW: 15 % (ref 11.5–15.5)
WBC: 4.1 10*3/uL (ref 4.0–10.5)

## 2014-11-29 LAB — HIV-1 RNA QUANT-NO REFLEX-BLD
HIV 1 RNA Quant: <20 copies/mL (ref <20)
HIV-1 RNA Quant, Log: <1.3 {Log} (ref <1.30)

## 2014-11-29 LAB — T-HELPER CELL (CD4) - (RCID CLINIC ONLY)
CD4 % Helper T Cell: 36 % (ref 33–55)
CD4 T Cell Abs: 210 /uL — ABNORMAL LOW (ref 400–2700)

## 2014-11-30 ENCOUNTER — Telehealth: Payer: Self-pay | Admitting: *Deleted

## 2014-11-30 DIAGNOSIS — Z21 Asymptomatic human immunodeficiency virus [HIV] infection status: Secondary | ICD-10-CM

## 2014-11-30 DIAGNOSIS — Z113 Encounter for screening for infections with a predominantly sexual mode of transmission: Secondary | ICD-10-CM

## 2014-11-30 DIAGNOSIS — B20 Human immunodeficiency virus [HIV] disease: Secondary | ICD-10-CM

## 2014-11-30 MED ORDER — DARUNAVIR-COBICISTAT 800-150 MG PO TABS: 1.0000 | ORAL_TABLET | Freq: Every day | ORAL | Status: DC

## 2014-11-30 MED ORDER — EMTRICITABINE-TENOFOVIR DF 200-300 MG PO TABS: 1.0000 | ORAL_TABLET | Freq: Every day | ORAL | Status: DC

## 2014-11-30 NOTE — Telephone Encounter (Signed)
Pt needing refills called in to pharmacy.

## 2014-12-01 ENCOUNTER — Encounter: Payer: Self-pay | Admitting: Infectious Disease

## 2014-12-04 ENCOUNTER — Encounter: Payer: Self-pay | Admitting: Infectious Disease

## 2014-12-06 ENCOUNTER — Ambulatory Visit (INDEPENDENT_AMBULATORY_CARE_PROVIDER_SITE_OTHER): Payer: Self-pay | Admitting: *Deleted

## 2014-12-06 ENCOUNTER — Encounter: Payer: Self-pay | Admitting: Infectious Disease

## 2014-12-06 ENCOUNTER — Ambulatory Visit (INDEPENDENT_AMBULATORY_CARE_PROVIDER_SITE_OTHER): Payer: Medicare Other | Admitting: Infectious Disease

## 2014-12-06 VITALS — BP 129/80 | HR 77 | Temp 98.1°F | Wt 151.5 lb

## 2014-12-06 DIAGNOSIS — R110 Nausea: Secondary | ICD-10-CM

## 2014-12-06 DIAGNOSIS — K8689 Other specified diseases of pancreas: Secondary | ICD-10-CM

## 2014-12-06 DIAGNOSIS — B20 Human immunodeficiency virus [HIV] disease: Secondary | ICD-10-CM

## 2014-12-06 DIAGNOSIS — D696 Thrombocytopenia, unspecified: Secondary | ICD-10-CM

## 2014-12-06 DIAGNOSIS — Z006 Encounter for examination for normal comparison and control in clinical research program: Secondary | ICD-10-CM

## 2014-12-06 DIAGNOSIS — K868 Other specified diseases of pancreas: Secondary | ICD-10-CM

## 2014-12-06 DIAGNOSIS — F0390 Unspecified dementia without behavioral disturbance: Secondary | ICD-10-CM

## 2014-12-06 DIAGNOSIS — E881 Lipodystrophy, not elsewhere classified: Secondary | ICD-10-CM

## 2014-12-06 DIAGNOSIS — R64 Cachexia: Secondary | ICD-10-CM

## 2014-12-06 DIAGNOSIS — I10 Essential (primary) hypertension: Secondary | ICD-10-CM

## 2014-12-06 MED ORDER — DIPHENOXYLATE-ATROPINE 2.5-0.025 MG PO TABS: 1.0000 | ORAL_TABLET | Freq: Two times a day (BID) | ORAL | Status: DC | PRN

## 2014-12-06 MED ORDER — PROMETHAZINE HCL 25 MG PO TABS: 25.0000 mg | ORAL_TABLET | Freq: Two times a day (BID) | ORAL | Status: DC | PRN

## 2014-12-06 NOTE — Progress Notes (Signed)
Noah Henderson was here today to see Dr. Daiva EvesVan Dam and for his 8 month Reprieve study visit. He asked if we had any other studies he might be eligible for. I told him about the probiotic study and the neurocognitive study with intensification of  maraviroc or tivicay and he was interested in both of them. He said he has definitely noticed a loss of short term memory and he has to make lists of things he needs to do. He also has noticed how frail he is and has to be really carefull he doesn't fall and hurt himself. He denies any current muscle aches or increase in muscle weakness since he started the pitavastatin/placebo. He returns in July for the next study appt. Or earlier if we screen him for one of the other studies.

## 2014-12-06 NOTE — Progress Notes (Signed)
  Subjective:    Patient ID: Noah Henderson, male    DOB: 12/11/1950, 64 y.o.   MRN: [id number]  HPI   64 year old Caucasian man with HIV, previously well controlled on viramune XR and truvada with breakthrough viremia found to have a K103 mutation changed to Prezcobix and truvada, currently with good virological suppression and stable CD4 count, though missed several weeks due to a mixup with the ADAP application when the patient's license had expired and he had to require a new one  Lab Results  Component Value Date   HIV1RNAQUANT <20 11/27/2014   Lab Results  Component Value Date   CD4TABS 210* 11/27/2014   CD4TABS 150* 08/02/2014   CD4TABS 350* 01/11/2014   He is enrolled in the REPRIEVE trial     Review of Systems  Constitutional: Negative for fever, chills, diaphoresis, activity change, appetite change, fatigue and unexpected weight change.  HENT: Negative for congestion, rhinorrhea, sinus pressure, sneezing, sore throat and trouble swallowing.   Eyes: Negative for photophobia and visual disturbance.  Respiratory: Negative for cough, chest tightness, shortness of breath, wheezing and stridor.   Cardiovascular: Negative for chest pain, palpitations and leg swelling.  Gastrointestinal: Negative for nausea, vomiting, abdominal pain, diarrhea, constipation, blood in stool, abdominal distention and anal bleeding.  Genitourinary: Negative for dysuria, hematuria, flank pain and difficulty urinating.  Musculoskeletal: Negative for myalgias, back pain, joint swelling, arthralgias and gait problem.  Skin: Negative for color change, pallor, rash and wound.  Neurological: Negative for dizziness, tremors, weakness and light-headedness.  Hematological: Negative for adenopathy. Does not bruise/bleed easily.  Psychiatric/Behavioral: Negative for behavioral problems, confusion, sleep disturbance, dysphoric mood, decreased concentration and agitation.       Objective:   Physical Exam   Constitutional: He is oriented to person, place, and time. No distress.  HENT:  Head: Normocephalic and atraumatic.  Mouth/Throat: Oropharynx is clear and moist. No oropharyngeal exudate.  Eyes: Conjunctivae and EOM are normal. No scleral icterus.  Neck: Normal range of motion. Neck supple. No JVD present.  Cardiovascular: Normal rate.   Pulmonary/Chest: Effort normal and breath sounds normal. No respiratory distress. He has no wheezes.  Abdominal: Soft. He exhibits no distension.  Musculoskeletal: He exhibits no edema or tenderness.  Lymphadenopathy:    He has no cervical adenopathy.  Neurological: He is alert and oriented to person, place, and time. He exhibits normal muscle tone. Coordination normal.  Skin: Skin is warm and dry. He is not diaphoretic. No erythema. No pallor.  Psychiatric: He has a normal mood and affect. His behavior is normal. Judgment and thought content normal.          Assessment & Plan:   # 1 HIV: continue Prezcobix and Truvada and doing very well. recheck labs in 6 months time. I spent greater than 25 minutes with the patient including greater than 50% of time in face to face counsel of the patient and in coordination of their care.   #2 anxiety continue clonazepam, tox screen in future to ensure he is taking meds. 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

## 2014-12-25 ENCOUNTER — Other Ambulatory Visit: Payer: Self-pay | Admitting: Infectious Disease

## 2015-01-10 ENCOUNTER — Encounter: Payer: Self-pay | Admitting: Infectious Disease

## 2015-01-23 ENCOUNTER — Other Ambulatory Visit: Payer: Self-pay | Admitting: Infectious Disease

## 2015-01-23 DIAGNOSIS — G4700 Insomnia, unspecified: Secondary | ICD-10-CM

## 2015-01-25 ENCOUNTER — Other Ambulatory Visit: Payer: Self-pay | Admitting: *Deleted

## 2015-01-25 DIAGNOSIS — I10 Essential (primary) hypertension: Secondary | ICD-10-CM

## 2015-01-25 MED ORDER — ATENOLOL 50 MG PO TABS: 50.0000 mg | ORAL_TABLET | Freq: Every day | ORAL | Status: DC

## 2015-02-04 ENCOUNTER — Encounter: Payer: Self-pay | Admitting: Infectious Disease

## 2015-02-05 ENCOUNTER — Encounter: Payer: Self-pay | Admitting: *Deleted

## 2015-02-22 ENCOUNTER — Other Ambulatory Visit: Payer: Self-pay | Admitting: Infectious Disease

## 2015-03-27 ENCOUNTER — Other Ambulatory Visit: Payer: Self-pay | Admitting: Infectious Disease

## 2015-04-03 ENCOUNTER — Encounter (INDEPENDENT_AMBULATORY_CARE_PROVIDER_SITE_OTHER): Payer: Self-pay | Admitting: *Deleted

## 2015-04-03 ENCOUNTER — Other Ambulatory Visit (HOSPITAL_COMMUNITY)
Admission: RE | Admit: 2015-04-03 | Discharge: 2015-04-03 | Disposition: A | Discharge status: Home / Self Care | Payer: Medicare Other | Source: Ambulatory Visit | Attending: Infectious Disease | Admitting: Infectious Disease

## 2015-04-03 VITALS — BP 132/71 | HR 72 | Temp 98.1°F | Resp 16 | Wt 146.5 lb

## 2015-04-03 DIAGNOSIS — Z113 Encounter for screening for infections with a predominantly sexual mode of transmission: Secondary | ICD-10-CM | POA: Insufficient documentation

## 2015-04-03 DIAGNOSIS — Z006 Encounter for examination for normal comparison and control in clinical research program: Secondary | ICD-10-CM

## 2015-04-03 DIAGNOSIS — B20 Human immunodeficiency virus [HIV] disease: Secondary | ICD-10-CM

## 2015-04-03 LAB — COMPLETE METABOLIC PANEL WITH GFR
ALT: 19 U/L (ref 0–53)
AST: 56 U/L — AB (ref 0–37)
Albumin: 4.6 g/dL (ref 3.5–5.2)
Alkaline Phosphatase: 85 U/L (ref 39–117)
BILIRUBIN TOTAL: 1.9 mg/dL — AB (ref 0.2–1.2)
BUN: 14 mg/dL (ref 6–23)
CO2: 22 meq/L (ref 19–32)
Calcium: 9.4 mg/dL (ref 8.4–10.5)
Chloride: 102 mEq/L (ref 96–112)
Creat: 1.27 mg/dL (ref 0.50–1.35)
GFR, EST NON AFRICAN AMERICAN: 60 mL/min
GFR, Est African American: 70 mL/min
GLUCOSE: 88 mg/dL (ref 70–99)
POTASSIUM: 3.9 meq/L (ref 3.5–5.3)
SODIUM: 140 meq/L (ref 135–145)
Total Protein: 7.3 g/dL (ref 6.0–8.3)

## 2015-04-03 LAB — LIPID PANEL
Cholesterol: 103 mg/dL (ref 0–200)
HDL: 46 mg/dL (ref >=40)
LDL CALC: 46 mg/dL (ref 0–99)
Total CHOL/HDL Ratio: 2.2 Ratio
Triglycerides: 53 mg/dL (ref <150)
VLDL: 11 mg/dL (ref 0–40)

## 2015-04-03 NOTE — Progress Notes (Signed)
Noah Henderson is here for his 12 month Reprieve study visit. He denies any new problems except he has noticed an increase in leg cramps over the past couple months.he says he has been very adherent with his study meds. He will return in 4 months for the next visit.

## 2015-04-04 LAB — CBC WITH DIFFERENTIAL/PLATELET
BASOS PCT: 0 % (ref 0–1)
Basophils Absolute: 0 10*3/uL (ref 0.0–0.1)
Eosinophils Absolute: 0.1 10*3/uL (ref 0.0–0.7)
Eosinophils Relative: 1 % (ref 0–5)
HCT: 39.7 % (ref 39.0–52.0)
Hemoglobin: 14 g/dL (ref 13.0–17.0)
Lymphocytes Relative: 12 % (ref 12–46)
Lymphs Abs: 0.9 10*3/uL (ref 0.7–4.0)
MCH: 28.6 pg (ref 26.0–34.0)
MCHC: 35.3 g/dL (ref 30.0–36.0)
MCV: 81.2 fL (ref 78.0–100.0)
MONOS PCT: 5 % (ref 3–12)
MPV: 10 fL (ref 8.6–12.4)
Monocytes Absolute: 0.4 10*3/uL (ref 0.1–1.0)
NEUTROS ABS: 6.5 10*3/uL (ref 1.7–7.7)
Neutrophils Relative %: 82 % — ABNORMAL HIGH (ref 43–77)
PLATELETS: 75 10*3/uL — AB (ref 150–400)
RBC: 4.89 MIL/uL (ref 4.22–5.81)
RDW: 15 % (ref 11.5–15.5)
WBC: 7.9 10*3/uL (ref 4.0–10.5)

## 2015-04-04 LAB — MICROALBUMIN / CREATININE URINE RATIO
Creatinine, Urine: 405.4 mg/dL
MICROALB UR: 4.2 mg/dL — AB (ref <2.0)
MICROALB/CREAT RATIO: 10.4 mg/g (ref 0.0–30.0)

## 2015-04-04 LAB — HEPATITIS C ANTIBODY: HCV Ab: NEGATIVE

## 2015-04-04 LAB — HIV-1 RNA QUANT-NO REFLEX-BLD: HIV-1 RNA Quant, Log: <1.3 {Log} (ref <1.30)

## 2015-04-04 LAB — URINE CYTOLOGY ANCILLARY ONLY
CHLAMYDIA, DNA PROBE: NEGATIVE
Neisseria Gonorrhea: NEGATIVE

## 2015-04-04 LAB — RPR

## 2015-04-04 NOTE — Addendum Note (Signed)
Addended by: Mariea ClontsGREEN, Leeyah Heather D on: 04/04/2015 09:54 AM   Modules accepted: Orders

## 2015-04-05 LAB — T-HELPER CELL (CD4) - (RCID CLINIC ONLY)
CD4 % Helper T Cell: 30 % — ABNORMAL LOW (ref 33–55)
CD4 T CELL ABS: 270 /uL — AB (ref 400–2700)

## 2015-04-17 ENCOUNTER — Ambulatory Visit: Payer: Medicare Other | Admitting: Infectious Disease

## 2015-04-18 ENCOUNTER — Encounter: Payer: Self-pay | Admitting: Infectious Disease

## 2015-04-24 ENCOUNTER — Other Ambulatory Visit: Payer: Self-pay | Admitting: Infectious Disease

## 2015-05-10 ENCOUNTER — Encounter: Payer: Self-pay | Admitting: *Deleted

## 2015-05-14 ENCOUNTER — Ambulatory Visit (INDEPENDENT_AMBULATORY_CARE_PROVIDER_SITE_OTHER): Payer: Medicare Other | Admitting: Infectious Disease

## 2015-05-14 ENCOUNTER — Encounter (INDEPENDENT_AMBULATORY_CARE_PROVIDER_SITE_OTHER): Payer: Self-pay | Admitting: *Deleted

## 2015-05-14 ENCOUNTER — Encounter: Payer: Self-pay | Admitting: Infectious Disease

## 2015-05-14 VITALS — BP 131/75 | HR 77 | Temp 97.9°F | Wt 150.5 lb

## 2015-05-14 DIAGNOSIS — Z113 Encounter for screening for infections with a predominantly sexual mode of transmission: Secondary | ICD-10-CM | POA: Diagnosis not present

## 2015-05-14 DIAGNOSIS — G969 Disorder of central nervous system, unspecified: Secondary | ICD-10-CM | POA: Diagnosis not present

## 2015-05-14 DIAGNOSIS — B20 Human immunodeficiency virus [HIV] disease: Secondary | ICD-10-CM

## 2015-05-14 DIAGNOSIS — Z21 Asymptomatic human immunodeficiency virus [HIV] infection status: Secondary | ICD-10-CM | POA: Diagnosis not present

## 2015-05-14 DIAGNOSIS — Z006 Encounter for examination for normal comparison and control in clinical research program: Secondary | ICD-10-CM

## 2015-05-14 HISTORY — DX: Human immunodeficiency virus (HIV) disease: B20

## 2015-05-14 LAB — COMPREHENSIVE METABOLIC PANEL
ALBUMIN: 4 g/dL (ref 3.6–5.1)
ALT: 17 U/L (ref 9–46)
AST: 26 U/L (ref 10–35)
Alkaline Phosphatase: 72 U/L (ref 40–115)
BUN: 7 mg/dL (ref 7–25)
CHLORIDE: 106 mmol/L (ref 98–110)
CO2: 24 mmol/L (ref 20–31)
Calcium: 8.5 mg/dL — ABNORMAL LOW (ref 8.6–10.3)
Creat: 0.93 mg/dL (ref 0.70–1.25)
Glucose, Bld: 87 mg/dL (ref 65–99)
POTASSIUM: 3.9 mmol/L (ref 3.5–5.3)
SODIUM: 139 mmol/L (ref 135–146)
Total Bilirubin: 0.9 mg/dL (ref 0.2–1.2)
Total Protein: 6.8 g/dL (ref 6.1–8.1)

## 2015-05-14 LAB — CBC WITH DIFFERENTIAL/PLATELET
BASOS PCT: 0 % (ref 0–1)
Basophils Absolute: 0 10*3/uL (ref 0.0–0.1)
Eosinophils Absolute: 0.1 10*3/uL (ref 0.0–0.7)
Eosinophils Relative: 2 % (ref 0–5)
HCT: 36.1 % — ABNORMAL LOW (ref 39.0–52.0)
HEMOGLOBIN: 12.7 g/dL — AB (ref 13.0–17.0)
LYMPHS ABS: 0.8 10*3/uL (ref 0.7–4.0)
Lymphocytes Relative: 16 % (ref 12–46)
MCH: 28.7 pg (ref 26.0–34.0)
MCHC: 35.2 g/dL (ref 30.0–36.0)
MCV: 81.5 fL (ref 78.0–100.0)
MONO ABS: 0.3 10*3/uL (ref 0.1–1.0)
MONOS PCT: 6 % (ref 3–12)
MPV: 9.7 fL (ref 8.6–12.4)
NEUTROS PCT: 76 % (ref 43–77)
Neutro Abs: 3.9 10*3/uL (ref 1.7–7.7)
Platelets: 68 10*3/uL — ABNORMAL LOW (ref 150–400)
RBC: 4.43 MIL/uL (ref 4.22–5.81)
RDW: 14.5 % (ref 11.5–15.5)
WBC: 5.1 10*3/uL (ref 4.0–10.5)

## 2015-05-14 LAB — PHOSPHORUS: Phosphorus: 2.2 mg/dL — ABNORMAL LOW (ref 2.5–4.5)

## 2015-05-14 MED ORDER — EMTRICITABINE-TENOFOVIR AF 200-25 MG PO TABS: 1.0000 | ORAL_TABLET | Freq: Every day | ORAL | Status: DC

## 2015-05-14 MED ORDER — DARUNAVIR-COBICISTAT 800-150 MG PO TABS: 1.0000 | ORAL_TABLET | Freq: Every day | ORAL | Status: DC

## 2015-05-14 NOTE — Progress Notes (Signed)
Noah Henderson) was screened today for [phone], An ACTG Study Comparing ARV Intensification with maraviroc and dolutegravir for the treatment of cognitive impairment in HIV. Informed consent was obtained after we reviewed the consent together and he had time to ask questions about the study. He is already on the Reprieve study and understands the consent process. If eligible he will be randomized to take either maraviroc/placebo, dolutegravir/placebo or both maraviroc and dolutegravir with his current regimen to see if it helps improve congnition. He reports having memory problems past year or two and says it seems to be getting worse. He also reports some indecisiveness and fatigue, as well as joint, muscle aches and neuropathy in his extremities. He denies any depression or substance use that could confound testing results. Also, denies any past history or head injury or neurological event, such as stroke. He was able to complete the battery of neurological tests with out problem. Once his labs come back, we will send off his info to the A5324 core team to assess his eligibility for the study.he will return in 2 weeks for the preentry visit if he is eligible.

## 2015-05-14 NOTE — Progress Notes (Signed)
Subjective:    Patient ID: Noah Henderson, male    DOB: [birth year], 64 y.o.   MRN: [id number]  HPI   64 year old Caucasian man with HIV, previously well controlled on viramune XR and truvada with breakthrough viremia found to have a K103 mutation changed to Prezcobix and truvada, currently with good virological suppression and stable CD4 count.    Lab Results  Component Value Date   HIV1RNAQUANT <20 04/03/2015   Lab Results  Component Value Date   CD4TABS 270* 04/03/2015   CD4TABS 210* 11/27/2014   CD4TABS 150* 08/02/2014   He is enrolled in the REPRIEVE trial, and considering Neurocognitive study.  He DOES need to renew ADAP and we brought him to see LeDonna.  He is also c/o ear pain migrating from right to left ear    Review of Systems  Constitutional: Negative for fever, chills, diaphoresis, activity change, appetite change, fatigue and unexpected weight change.  HENT: Positive for ear pain. Negative for congestion, rhinorrhea, sinus pressure, sneezing, sore throat and trouble swallowing.   Eyes: Negative for photophobia and visual disturbance.  Respiratory: Negative for cough, chest tightness, shortness of breath, wheezing and stridor.   Cardiovascular: Negative for chest pain, palpitations and leg swelling.  Gastrointestinal: Negative for nausea, vomiting, abdominal pain, diarrhea, constipation, blood in stool, abdominal distention and anal bleeding.  Genitourinary: Negative for dysuria, hematuria, flank pain and difficulty urinating.  Musculoskeletal: Negative for myalgias, back pain, joint swelling, arthralgias and gait problem.  Skin: Negative for color change, pallor, rash and wound.  Neurological: Negative for dizziness, tremors, weakness and light-headedness.  Hematological: Negative for adenopathy. Does not bruise/bleed easily.  Psychiatric/Behavioral: Positive for decreased concentration. Negative for behavioral problems, confusion, sleep disturbance, dysphoric  mood and agitation.       Objective:   Physical Exam  Constitutional: He is oriented to person, place, and time. No distress.  HENT:  Head: Normocephalic and atraumatic.  Right Ear: Hearing, tympanic membrane, external ear and ear canal normal.  Left Ear: Hearing normal.  Ears:  Mouth/Throat: Oropharynx is clear and moist. No oropharyngeal exudate.  Eyes: Conjunctivae and EOM are normal. No scleral icterus.  Neck: Normal range of motion. Neck supple. No JVD present.  Cardiovascular: Normal rate, regular rhythm and normal heart sounds.  Exam reveals no gallop and no friction rub.   No murmur heard. Pulmonary/Chest: Effort normal and breath sounds normal. No respiratory distress. He has no wheezes. He has no rales.  Abdominal: Soft. He exhibits no distension. There is no tenderness. There is no rebound.  Musculoskeletal: He exhibits no edema or tenderness.  Lymphadenopathy:    He has no cervical adenopathy.  Neurological: He is alert and oriented to person, place, and time. He exhibits normal muscle tone. Coordination normal.  Skin: Skin is warm and dry. He is not diaphoretic. No erythema. No pallor.  Psychiatric: He has a normal mood and affect. His behavior is normal. Judgment and thought content normal.          Assessment & Plan:   # 1 HIV: continue Prezcobix and change to Descovy  . #2 Anxiety continue clonazepam, tox screen in future to ensure he is taking meds. A;sp wotj elavil  #3 pancreatic insufficiency continue pancreatic enzyme replacement  #4 GERD: on ppi  #5 HIV neurocognitive disorder with mild dementia: likely enroll into ACTG trial  I spent greater than 25 minutes with the patient including greater than 50% of time in face to face counsel of the patient  re his HIV, his HIV neurocognitive disorder, pancreatic insufficiency, GERD  and in coordination of their care.

## 2015-05-15 LAB — HIV-1 RNA QUANT-NO REFLEX-BLD: HIV-1 RNA Quant, Log: <1.3 {Log} (ref <1.30)

## 2015-05-17 ENCOUNTER — Telehealth: Payer: Self-pay

## 2015-05-17 NOTE — Telephone Encounter (Signed)
Patient calling to say script was sent to the wrong pharmacy.  It should have been sent to an ADAP pharmacy.  He has called the pharmacy and things are now straightened out.   Laurell Josephs, RN

## 2015-05-18 ENCOUNTER — Other Ambulatory Visit: Payer: Self-pay | Admitting: Infectious Disease

## 2015-05-29 ENCOUNTER — Encounter (INDEPENDENT_AMBULATORY_CARE_PROVIDER_SITE_OTHER): Payer: Medicare Other | Admitting: *Deleted

## 2015-05-29 VITALS — BP 124/74 | HR 65 | Temp 97.9°F | Resp 16 | Wt 151.2 lb

## 2015-05-29 DIAGNOSIS — Z006 Encounter for examination for normal comparison and control in clinical research program: Secondary | ICD-10-CM

## 2015-05-29 NOTE — Progress Notes (Signed)
Noah Henderson is here for his preentry visit for [phone]: A Randomized Double Blinded, Placebo Controlled Trial Comparing Antiretroviral Intensification with Maraviroc or Dolutegravir for the Treatment of Cognitive Impairment in HIV. He denies any new problems or concerns. I reviewed the results of the neurocognitive testing with him which showed mild neurocognitive disorder and made him eligible for the study. We reviewed the protocol to make sure he understood the requirements and he was able to verbalize the purpose of the study and what he is expected to do while on study. We plan on enrolling him in 2 weeks and he will start drug that day.

## 2015-06-12 ENCOUNTER — Other Ambulatory Visit: Payer: Medicare Other

## 2015-06-12 ENCOUNTER — Encounter (INDEPENDENT_AMBULATORY_CARE_PROVIDER_SITE_OTHER): Payer: Self-pay | Admitting: *Deleted

## 2015-06-12 VITALS — BP 135/74 | HR 63 | Temp 98.4°F | Resp 17 | Ht 67.0 in | Wt 150.5 lb

## 2015-06-12 DIAGNOSIS — Z006 Encounter for examination for normal comparison and control in clinical research program: Secondary | ICD-10-CM

## 2015-06-12 LAB — COMPREHENSIVE METABOLIC PANEL
ALBUMIN: 4.9 g/dL (ref 3.6–5.1)
ALK PHOS: 93 U/L (ref 40–115)
ALT: 14 U/L (ref 9–46)
AST: 23 U/L (ref 10–35)
BILIRUBIN TOTAL: 1 mg/dL (ref 0.2–1.2)
BUN: 8 mg/dL (ref 7–25)
CALCIUM: 9.6 mg/dL (ref 8.6–10.3)
CO2: 26 mmol/L (ref 20–31)
CREATININE: 0.92 mg/dL (ref 0.70–1.25)
Chloride: 102 mmol/L (ref 98–110)
Glucose, Bld: 75 mg/dL (ref 65–99)
Potassium: 4 mmol/L (ref 3.5–5.3)
SODIUM: 138 mmol/L (ref 135–146)
TOTAL PROTEIN: 7.9 g/dL (ref 6.1–8.1)

## 2015-06-12 LAB — PHOSPHORUS: PHOSPHORUS: 3.2 mg/dL (ref 2.5–4.5)

## 2015-06-12 LAB — POCT URINALYSIS DIPSTICK
BILIRUBIN UA: NEGATIVE
Blood, UA: NEGATIVE
GLUCOSE UA: NEGATIVE
Ketones, UA: NEGATIVE
LEUKOCYTES UA: NEGATIVE
NITRITE UA: NEGATIVE
Protein, UA: NEGATIVE
Spec Grav, UA: <=1.005
UROBILINOGEN UA: 0.2
pH, UA: 6

## 2015-06-12 NOTE — Progress Notes (Signed)
Noah Henderson is here for A5324 study entry. Inclusion and Exclusion criteria was assessed; Willingness of subject participation on study was confirmed. Subject was randomized to study. Vitals were obtained and within normal limits. Medications and medical history had not changed since last visit. He denies any new symptoms or problems. Fasting blood was drawn and urine for UA obtained. Questionnaire completed. Study drug was not dispensed due to study drug NOT arriving at pharmacy. Supposed ti arrive today and will dispense to subject when study drug is ready for pick up from pharmacy. He has 72 hours to start study drug. I discussed how to administer study drug and potential side effects. He has my contact information for if he has any questions. He received $50 gift card for visit. Next appointment scheduled for June 26, 2015 @ 9am. Tacey Heap RN

## 2015-06-26 ENCOUNTER — Encounter (INDEPENDENT_AMBULATORY_CARE_PROVIDER_SITE_OTHER): Payer: Medicare Other | Admitting: *Deleted

## 2015-06-26 VITALS — BP 131/75 | HR 75 | Temp 97.8°F | Resp 16 | Wt 152.8 lb

## 2015-06-26 DIAGNOSIS — Z006 Encounter for examination for normal comparison and control in clinical research program: Secondary | ICD-10-CM

## 2015-06-26 NOTE — Progress Notes (Signed)
Noah Henderson is here today for his week 2  Study visit for A5324: A Randomized Double Blinded, Placebo Controlled Trial Comparing Antiretroviral Intensification with Maraviroc or Dolutegravir for the Treatment of Cognitive Impairment in HIV. He says he started the study meds on 06/15/15 and has not noticed any side effects. I reviewed his dosing and adherence and study comprehension and he has been very adherent and taking the medications properly. He will return in 2 weeks for the next study visit.

## 2015-07-04 ENCOUNTER — Encounter: Payer: Self-pay | Admitting: Infectious Disease

## 2015-07-04 LAB — CD4/CD8 (T-HELPER/T-SUPPRESSOR CELL)
CD4%: 35.7
CD4: 357
CD8 T CELL SUPPRESSOR: 29.3
CD8: 293

## 2015-07-04 LAB — HIV-1 RNA QUANT-NO REFLEX-BLD: HIV-1 RNA Viral Load: <40

## 2015-07-09 ENCOUNTER — Other Ambulatory Visit: Payer: Self-pay | Admitting: Infectious Disease

## 2015-07-10 ENCOUNTER — Encounter (INDEPENDENT_AMBULATORY_CARE_PROVIDER_SITE_OTHER): Payer: Self-pay | Admitting: *Deleted

## 2015-07-10 VITALS — BP 121/72 | HR 75 | Temp 98.1°F | Resp 16 | Wt 151.8 lb

## 2015-07-10 DIAGNOSIS — Z006 Encounter for examination for normal comparison and control in clinical research program: Secondary | ICD-10-CM

## 2015-07-10 LAB — COMPREHENSIVE METABOLIC PANEL
ALT: 12 U/L (ref 9–46)
AST: 20 U/L (ref 10–35)
Albumin: 4.3 g/dL (ref 3.6–5.1)
Alkaline Phosphatase: 80 U/L (ref 40–115)
BUN: 9 mg/dL (ref 7–25)
CALCIUM: 9.4 mg/dL (ref 8.6–10.3)
CHLORIDE: 104 mmol/L (ref 98–110)
CO2: 26 mmol/L (ref 20–31)
Creat: 0.95 mg/dL (ref 0.70–1.25)
Glucose, Bld: 71 mg/dL (ref 65–99)
POTASSIUM: 3.6 mmol/L (ref 3.5–5.3)
Sodium: 141 mmol/L (ref 135–146)
TOTAL PROTEIN: 6.8 g/dL (ref 6.1–8.1)
Total Bilirubin: 0.9 mg/dL (ref 0.2–1.2)

## 2015-07-10 LAB — PHOSPHORUS: PHOSPHORUS: 2.7 mg/dL (ref 2.5–4.5)

## 2015-07-10 NOTE — Progress Notes (Signed)
[id number] Study: A Randomized, Double Blinded, Placebo-Controlled Trial Comparing Antiretroviral Intensification with Maraviroc and Dolutegravir with no Intensification or Intensification with Dolutegravir Alone for the Treatment of Cognitive Impairment in HIV. Study Meds: Maraviroc/Placebo and Dolutegravir/Placebo Duration: about 2 years  Noah Henderson is here for study visit week 4. He denies any new symptoms and his assessment is unchanged from last visit. He has been adherent with study medications. Fasting labs obtained and vital are stable. Next appointment will include the 4 hour PK. We discussed not taking his medication the morning of his appointment. He takes the Maraviroc portion in the morning so there was no need in switching his meds for the PK. Study meds dispensed. He received $50 gift card for visit. Next appointment scheduled for Tuesday, September 04, 2015 @ 9am. Noah HeapElisha Annye Forrey RN

## 2015-08-01 ENCOUNTER — Encounter (INDEPENDENT_AMBULATORY_CARE_PROVIDER_SITE_OTHER): Payer: Medicare Other | Admitting: *Deleted

## 2015-08-01 VITALS — BP 125/73 | HR 76 | Temp 98.2°F | Resp 16 | Wt 151.5 lb

## 2015-08-01 DIAGNOSIS — Z006 Encounter for examination for normal comparison and control in clinical research program: Secondary | ICD-10-CM

## 2015-08-01 NOTE — Progress Notes (Signed)
Noah Henderson is here for his month 16 Reprieve study visit. He denies any new problems or concerns. No new muscle aches or weakness reported. His pill counts are accurate for the past 4 months for this study. He returns next month for the week 12 A5324 study A5324: A Randomized Double Blinded, Placebo Controlled Trial Comparing Antiretroviral Intensification with Maraviroc or Dolutegravir for the Treatment of Cognitive Impairment in HIV.

## 2015-08-06 ENCOUNTER — Other Ambulatory Visit: Payer: Self-pay | Admitting: Infectious Disease

## 2015-08-20 ENCOUNTER — Ambulatory Visit (INDEPENDENT_AMBULATORY_CARE_PROVIDER_SITE_OTHER): Payer: Medicare Other | Admitting: Infectious Disease

## 2015-08-20 ENCOUNTER — Other Ambulatory Visit: Payer: Self-pay | Admitting: *Deleted

## 2015-08-20 ENCOUNTER — Encounter: Payer: Self-pay | Admitting: Infectious Disease

## 2015-08-20 VITALS — BP 120/72 | HR 69 | Temp 98.0°F | Ht 68.0 in | Wt 152.0 lb

## 2015-08-20 DIAGNOSIS — Z23 Encounter for immunization: Secondary | ICD-10-CM | POA: Diagnosis not present

## 2015-08-20 DIAGNOSIS — Z21 Asymptomatic human immunodeficiency virus [HIV] infection status: Secondary | ICD-10-CM | POA: Diagnosis not present

## 2015-08-20 DIAGNOSIS — B20 Human immunodeficiency virus [HIV] disease: Secondary | ICD-10-CM | POA: Diagnosis not present

## 2015-08-20 DIAGNOSIS — F0280 Dementia in other diseases classified elsewhere without behavioral disturbance: Secondary | ICD-10-CM | POA: Diagnosis not present

## 2015-08-20 DIAGNOSIS — K8689 Other specified diseases of pancreas: Secondary | ICD-10-CM

## 2015-08-20 DIAGNOSIS — Z113 Encounter for screening for infections with a predominantly sexual mode of transmission: Secondary | ICD-10-CM

## 2015-08-20 DIAGNOSIS — F4323 Adjustment disorder with mixed anxiety and depressed mood: Secondary | ICD-10-CM

## 2015-08-20 HISTORY — DX: Human immunodeficiency virus (HIV) disease: B20

## 2015-08-20 MED ORDER — EMTRICITABINE-TENOFOVIR DF 200-300 MG PO TABS: 1.0000 | ORAL_TABLET | Freq: Every day | ORAL | Status: DC

## 2015-08-20 MED ORDER — DOLUTEGRAVIR SODIUM 50 MG PO TABS: 50.0000 mg | ORAL_TABLET | Freq: Every day | ORAL | Status: DC

## 2015-08-20 MED ORDER — MARAVIROC 150 MG PO TABS: 150.0000 mg | ORAL_TABLET | Freq: Two times a day (BID) | ORAL | Status: DC

## 2015-08-20 MED ORDER — DARUNAVIR-COBICISTAT 800-150 MG PO TABS: 1.0000 | ORAL_TABLET | Freq: Every day | ORAL | Status: DC

## 2015-08-20 NOTE — Patient Instructions (Signed)
MAKE AN APPOINTMENT WITH [facility]

## 2015-08-20 NOTE — Progress Notes (Signed)
Subjective:    Patient ID: Noah Henderson, male    DOB: 02/27/1951, 64 y.o.   MRN: [id number]  HPI   64 year old Caucasian man with HIV, previously well controlled on viramune XR and truvada with breakthrough viremia found to have a K103 mutation changed to Prezcobix and truvada, with good virological suppression and stable CD4 count.  He is currently enrolled into REPRIEVE.  [id number]: A Randomized Double Blinded, Placebo Controlled Trial Comparing Antiretroviral Intensification with Maraviroc or Dolutegravir for the Treatment of Cognitive Impairment in HIV  His labs continue to look great and CD4 higher than in several years.  Lab Results  Component Value Date   HIV1RNAQUANT <20 05/14/2015   Lab Results  Component Value Date   CD4TABS 357 06/12/2015   CD4TABS 270* 04/03/2015   CD4TABS 210* 11/27/2014   Past Medical History  Diagnosis Date  . AIDS (HCC)   . Thrombocytopenia (HCC)   . Pancreatitis   . Dementia   . HIV lipodystrophy (HCC)   . Hyperlipidemia   . Depression   . Anemia   . Anxiety   . HIV infection with neurological disease (HCC) 05/14/2015    No past surgical history on file.  Family History  Problem Relation Age of Onset  . Heart disease Mother   . Heart disease Father   . Mental illness Father   . Heart disease Brother       Social History   Social History  . Marital Status: Married    Spouse Name: N/A  . Number of Children: N/A  . Years of Education: N/A   Social History Main Topics  . Smoking status: Never Smoker   . Smokeless tobacco: Never Used  . Alcohol Use: No  . Drug Use: No  . Sexual Activity:    Partners: Male     Comment: pt. given condoms   Other Topics Concern  . None   Social History Narrative    Allergies  Allergen Reactions  . Penicillins      Current outpatient prescriptions:  .  amitriptyline (ELAVIL) 50 MG tablet, TAKE 1 TABLET BY MOUTH EVERY NIGHT AT BEDTIME, Disp: 30 tablet, Rfl: 5 .  atenolol (TENORMIN)  50 MG tablet, Take 1 tablet (50 mg total) by mouth daily., Disp: 30 tablet, Rfl: 11 .  clonazePAM (KLONOPIN) 2 MG tablet, TAKE 1 TABLET BY MOUTH TWICE DAILY, Disp: 60 tablet, Rfl: 0 .  CREON 24000 UNITS CPEP, TAKE 2 CAPSULE BY MOUTH THREE TIMES DAILY, Disp: 180 capsule, Rfl: 5 .  darunavir-cobicistat (PREZCOBIX) 800-150 MG tablet, Take 1 tablet by mouth daily., Disp: 30 tablet, Rfl: 11 .  diphenoxylate-atropine (LOMOTIL) 2.5-0.025 MG tablet, TAKE 1 TABLET BY MOUTH TWICE DAILY AS NEEDED FOR DIARRHEA, Disp: 30 tablet, Rfl: 0 .  emtricitabine-tenofovir (TRUVADA) 200-300 MG tablet, Take 1 tablet by mouth daily., Disp: 30 tablet, Rfl: 11 .  feeding supplement (ENSURE IMMUNE HEALTH) LIQD, Take 237 mLs by mouth 3 (three) times daily with meals., Disp: 90 Bottle, Rfl: 11 .  omeprazole (PRILOSEC) 40 MG capsule, TAKE ONE CAPSULE BY MOUTH DAILY, Disp: 30 capsule, Rfl: 5 .  Pitavastatin Calcium 4 MG TABS, Take 4 mg by mouth daily. Study provided, drug may be placebo, Disp: , Rfl:  .  promethazine (PHENERGAN) 25 MG tablet, Take 1 tablet (25 mg total) by mouth 2 (two) times daily as needed for nausea or vomiting., Disp: 60 tablet, Rfl: 11 .  dolutegravir (TIVICAY) 50 MG tablet, Take 1 tablet (50 mg  total) by mouth daily. This may be placebo, study provided. Do not fill prescription., Disp: 30 tablet, Rfl: 11 .  emtricitabine-tenofovir AF (DESCOVY) 200-25 MG per tablet, Take 1 tablet by mouth daily. (Patient not taking: Reported on 08/20/2015), Disp: 30 tablet, Rfl: 11 .  maraviroc (SELZENTRY) 150 MG tablet, Take 1 tablet (150 mg total) by mouth 2 (two) times daily. May be placebo, this is study provided, do not fill prescription., Disp: 60 tablet, Rfl: 11   Review of Systems  Constitutional: Negative for fever, chills, diaphoresis, activity change, appetite change, fatigue and unexpected weight change.  HENT: Negative for congestion, ear pain, rhinorrhea, sinus pressure, sneezing, sore throat and trouble  swallowing.   Eyes: Negative for photophobia and visual disturbance.  Respiratory: Negative for cough, chest tightness, shortness of breath, wheezing and stridor.   Cardiovascular: Negative for chest pain, palpitations and leg swelling.  Gastrointestinal: Negative for nausea, vomiting, abdominal pain, diarrhea, constipation, blood in stool, abdominal distention and anal bleeding.  Genitourinary: Negative for dysuria, hematuria, flank pain and difficulty urinating.  Musculoskeletal: Negative for myalgias, back pain, joint swelling, arthralgias and gait problem.  Skin: Negative for color change, pallor, rash and wound.  Neurological: Negative for dizziness, tremors, weakness and light-headedness.  Hematological: Negative for adenopathy. Does not bruise/bleed easily.  Psychiatric/Behavioral: Positive for decreased concentration. Negative for behavioral problems, confusion, sleep disturbance, dysphoric mood and agitation.       Objective:   Physical Exam  Constitutional: He is oriented to person, place, and time. No distress.  HENT:  Head: Normocephalic and atraumatic.  Right Ear: Hearing, tympanic membrane, external ear and ear canal normal.  Left Ear: Hearing normal.  Ears:  Mouth/Throat: Oropharynx is clear and moist. No oropharyngeal exudate.  Eyes: Conjunctivae and EOM are normal. No scleral icterus.  Neck: Normal range of motion. Neck supple. No JVD present.  Cardiovascular: Normal rate, regular rhythm and normal heart sounds.  Exam reveals no gallop and no friction rub.   No murmur heard. Pulmonary/Chest: Effort normal and breath sounds normal. No respiratory distress. He has no wheezes. He has no rales.  Abdominal: Soft. He exhibits no distension. There is no tenderness. There is no rebound.  Musculoskeletal: He exhibits no edema or tenderness.  Lymphadenopathy:    He has no cervical adenopathy.  Neurological: He is alert and oriented to person, place, and time. He exhibits  normal muscle tone. Coordination normal.  Skin: Skin is warm and dry. He is not diaphoretic. No erythema. No pallor.  Psychiatric: He has a normal mood and affect. His behavior is normal. Judgment and thought content normal.          Assessment & Plan:   # 1 HIV: continue Prezcobix and change  Back to Truvada as required for the ACTG study in which he may be getting  Antiretroviral Intensification with Maraviroc or Dolutegravir  . 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

## 2015-09-03 ENCOUNTER — Telehealth: Payer: Self-pay | Admitting: *Deleted

## 2015-09-03 ENCOUNTER — Other Ambulatory Visit: Payer: Self-pay | Admitting: *Deleted

## 2015-09-03 DIAGNOSIS — I10 Essential (primary) hypertension: Secondary | ICD-10-CM

## 2015-09-03 NOTE — Telephone Encounter (Signed)
error 

## 2015-09-04 ENCOUNTER — Encounter (INDEPENDENT_AMBULATORY_CARE_PROVIDER_SITE_OTHER): Payer: Medicare Other | Admitting: *Deleted

## 2015-09-04 VITALS — BP 122/70 | HR 84 | Temp 98.2°F | Resp 16 | Wt 149.0 lb

## 2015-09-04 DIAGNOSIS — Z006 Encounter for examination for normal comparison and control in clinical research program: Secondary | ICD-10-CM

## 2015-09-04 LAB — COMPREHENSIVE METABOLIC PANEL
ALBUMIN: 3.8 g/dL (ref 3.6–5.1)
ALK PHOS: 59 U/L (ref 40–115)
ALT: 28 U/L (ref 9–46)
AST: 45 U/L — AB (ref 10–35)
BILIRUBIN TOTAL: 1.1 mg/dL (ref 0.2–1.2)
BUN: 12 mg/dL (ref 7–25)
CO2: 26 mmol/L (ref 20–31)
CREATININE: 0.99 mg/dL (ref 0.70–1.25)
Calcium: 8.4 mg/dL — ABNORMAL LOW (ref 8.6–10.3)
Chloride: 99 mmol/L (ref 98–110)
Glucose, Bld: 101 mg/dL — ABNORMAL HIGH (ref 65–99)
Potassium: 3.4 mmol/L — ABNORMAL LOW (ref 3.5–5.3)
SODIUM: 133 mmol/L — AB (ref 135–146)
TOTAL PROTEIN: 6.2 g/dL (ref 6.1–8.1)

## 2015-09-04 LAB — CD4/CD8 (T-HELPER/T-SUPPRESSOR CELL)
CD4%: 35.6
CD4: 178
CD8 T CELL SUPPRESSOR: 28.3
CD8: 142

## 2015-09-04 LAB — PHOSPHORUS: Phosphorus: 2.2 mg/dL — ABNORMAL LOW (ref 2.5–4.5)

## 2015-09-04 LAB — HIV-1 RNA QUANT-NO REFLEX-BLD

## 2015-09-04 NOTE — Progress Notes (Signed)
Noah Henderson is here for his week 12 visit for A5324: A Randomized Double Blinded, Placebo Controlled Trial Comparing Antiretroviral Intensification with Maraviroc or Dolutegravir for the Treatment of Cognitive Impairment in HIV. He says he has been very adherent with his study meds and other HIV meds. He has not noticed any side effects from the 2 new drugs. He says he has noticed being more unsteady over the past 6 months. We did a 4 hr PK blood draw on him today. We initially drew blood at 11:15 for the predose draw and then he took his am dose of maraviroc/dolutegravir at 11:21am. The 2nd PK draw was done 2 hours later at 1:20pm. The 4 hr blood draw was at 3:21pm.We were unable to process [facility] for study because he arrived too late in the day for us to [facility]. He said he overslept and it is difficult for him to come to town very often. His next visit is 3/15 for both the Reprieve Study and this one and he will also see Dr. Daiva EvesVan Dam that day.

## 2015-09-06 ENCOUNTER — Other Ambulatory Visit: Payer: Self-pay | Admitting: Infectious Disease

## 2015-09-06 DIAGNOSIS — K858 Other acute pancreatitis without necrosis or infection: Secondary | ICD-10-CM

## 2015-09-06 DIAGNOSIS — K219 Gastro-esophageal reflux disease without esophagitis: Secondary | ICD-10-CM

## 2015-09-06 DIAGNOSIS — B20 Human immunodeficiency virus [HIV] disease: Secondary | ICD-10-CM

## 2015-09-07 ENCOUNTER — Other Ambulatory Visit: Payer: Self-pay | Admitting: *Deleted

## 2015-09-07 MED ORDER — CLONAZEPAM 2 MG PO TABS: 2.0000 mg | ORAL_TABLET | Freq: Two times a day (BID) | ORAL | Status: DC

## 2015-09-10 ENCOUNTER — Other Ambulatory Visit: Payer: Self-pay | Admitting: *Deleted

## 2015-09-10 MED ORDER — EMTRICITABINE-TENOFOVIR AF 200-25 MG PO TABS: 1.0000 | ORAL_TABLET | Freq: Every day | ORAL | Status: DC

## 2015-09-26 ENCOUNTER — Telehealth: Payer: Self-pay | Admitting: *Deleted

## 2015-09-26 NOTE — Telephone Encounter (Signed)
I dont want to add more sedatives at this point. I would like him to establish with PCP

## 2015-09-26 NOTE — Telephone Encounter (Signed)
Per the pt, Clonazepam is not working for sleep, only 2-3 hrs/night.  He would like to try something different for sleep.  Pt has been taking the Clonazepam BID.  [facility] [place].  Most recently refilled 09/07/15.  MD please advise.

## 2015-10-02 ENCOUNTER — Other Ambulatory Visit: Payer: Self-pay | Admitting: Infectious Disease

## 2015-10-02 ENCOUNTER — Encounter: Payer: Self-pay | Admitting: Infectious Disease

## 2015-10-02 NOTE — Telephone Encounter (Signed)
Per Dr. Daiva EvesVan Dam pt needs to establish with PCP for these issues.  Left a message for the patient to either find his own PCP or call [facility] for assistance.

## 2015-10-04 ENCOUNTER — Encounter: Payer: Self-pay | Admitting: Infectious Disease

## 2015-10-05 ENCOUNTER — Other Ambulatory Visit: Payer: Self-pay | Admitting: *Deleted

## 2015-10-05 ENCOUNTER — Encounter: Payer: Self-pay | Admitting: Infectious Disease

## 2015-10-05 MED ORDER — CLONAZEPAM 2 MG PO TABS: 2.0000 mg | ORAL_TABLET | Freq: Two times a day (BID) | ORAL | Status: DC

## 2015-10-08 ENCOUNTER — Ambulatory Visit: Payer: Medicare Other

## 2015-10-09 ENCOUNTER — Telehealth: Payer: Self-pay

## 2015-10-17 NOTE — Telephone Encounter (Signed)
Attempted to reach pt via phone; left message that paperwork has been mailed per pt's request

## 2015-10-29 ENCOUNTER — Telehealth: Payer: Self-pay

## 2015-10-29 NOTE — Telephone Encounter (Signed)
Returned call to pt to confirm that his documents had been received; application submitted to [facility]

## 2015-11-02 ENCOUNTER — Other Ambulatory Visit: Payer: Self-pay | Admitting: Infectious Disease

## 2015-11-02 DIAGNOSIS — F411 Generalized anxiety disorder: Secondary | ICD-10-CM

## 2015-11-02 DIAGNOSIS — R110 Nausea: Secondary | ICD-10-CM

## 2015-11-12 ENCOUNTER — Encounter: Payer: Self-pay | Admitting: Infectious Disease

## 2015-11-13 ENCOUNTER — Other Ambulatory Visit: Payer: Self-pay | Admitting: Infectious Disease

## 2015-11-13 DIAGNOSIS — F4323 Adjustment disorder with mixed anxiety and depressed mood: Secondary | ICD-10-CM

## 2015-11-30 ENCOUNTER — Other Ambulatory Visit: Payer: Self-pay | Admitting: Infectious Disease

## 2015-12-05 ENCOUNTER — Ambulatory Visit: Payer: Medicare Other | Admitting: *Deleted

## 2015-12-05 ENCOUNTER — Ambulatory Visit (INDEPENDENT_AMBULATORY_CARE_PROVIDER_SITE_OTHER): Payer: Medicare Other | Admitting: Infectious Disease

## 2015-12-05 ENCOUNTER — Other Ambulatory Visit: Payer: Self-pay | Admitting: Infectious Disease

## 2015-12-05 ENCOUNTER — Encounter: Payer: Self-pay | Admitting: Infectious Disease

## 2015-12-05 ENCOUNTER — Encounter (INDEPENDENT_AMBULATORY_CARE_PROVIDER_SITE_OTHER): Payer: Medicare Other | Admitting: *Deleted

## 2015-12-05 VITALS — BP 137/78 | HR 79 | Temp 98.1°F | Resp 16 | Wt 153.2 lb

## 2015-12-05 DIAGNOSIS — F4323 Adjustment disorder with mixed anxiety and depressed mood: Secondary | ICD-10-CM

## 2015-12-05 DIAGNOSIS — B20 Human immunodeficiency virus [HIV] disease: Secondary | ICD-10-CM | POA: Diagnosis not present

## 2015-12-05 DIAGNOSIS — G969 Disorder of central nervous system, unspecified: Secondary | ICD-10-CM

## 2015-12-05 DIAGNOSIS — E881 Lipodystrophy, not elsewhere classified: Secondary | ICD-10-CM

## 2015-12-05 DIAGNOSIS — F0390 Unspecified dementia without behavioral disturbance: Secondary | ICD-10-CM | POA: Diagnosis not present

## 2015-12-05 DIAGNOSIS — R64 Cachexia: Secondary | ICD-10-CM

## 2015-12-05 DIAGNOSIS — K219 Gastro-esophageal reflux disease without esophagitis: Secondary | ICD-10-CM

## 2015-12-05 DIAGNOSIS — Z006 Encounter for examination for normal comparison and control in clinical research program: Secondary | ICD-10-CM

## 2015-12-05 LAB — COMPREHENSIVE METABOLIC PANEL
ALT: 11 U/L (ref 9–46)
AST: 19 U/L (ref 10–35)
Albumin: 4.3 g/dL (ref 3.6–5.1)
Alkaline Phosphatase: 84 U/L (ref 40–115)
BUN: 8 mg/dL (ref 7–25)
CO2: 26 mmol/L (ref 20–31)
CREATININE: 0.89 mg/dL (ref 0.70–1.25)
Calcium: 9.2 mg/dL (ref 8.6–10.3)
Chloride: 105 mmol/L (ref 98–110)
Glucose, Bld: 83 mg/dL (ref 65–99)
Potassium: 3.5 mmol/L (ref 3.5–5.3)
Sodium: 141 mmol/L (ref 135–146)
TOTAL PROTEIN: 6.8 g/dL (ref 6.1–8.1)
Total Bilirubin: 0.9 mg/dL (ref 0.2–1.2)

## 2015-12-05 LAB — CD4/CD8 (T-HELPER/T-SUPPRESSOR CELL)
CD4%: 38.3
CD4: 268
CD8 % Suppressor T Cell: 26.7
CD8: 187

## 2015-12-05 LAB — PHOSPHORUS: PHOSPHORUS: 3.6 mg/dL (ref 2.5–4.5)

## 2015-12-05 LAB — HIV-1 RNA QUANT-NO REFLEX-BLD: HIV-1 RNA Viral Load: <40

## 2015-12-05 MED ORDER — CIPROFLOXACIN-HYDROCORTISONE 0.2-1 % OT SUSP: 3.0000 [drp] | Freq: Two times a day (BID) | OTIC | Status: DC

## 2015-12-05 NOTE — BH Specialist Note (Signed)
Counselor met with Noah Henderson today in the exam room per Dr. Drucilla Schmidt request for reported depression and lack of adjustment over mother's death.  Patient was oriented times four with good affect (although sad) and dress.  Patient was alert and friendly with counselor.  Counselor educated patient about the stages of grief and encouraged him to evaluate where he thought he was on the continuum. Patient was cooperative and shared freely.  Patient indicated that he would call counselor if and when he had a need.    Rolena Infante, MA [facility]

## 2015-12-05 NOTE — Progress Notes (Signed)
Noah DanceKeith is here for the week 24 study visit for A5324: A Randomized Double Blinded, Placebo Controlled Trial Comparing Antiretroviral Intensification with Maraviroc or Dolutegravir for the Treatment of Cognitive Impairment in HIV and for Reprieve (20 month). He does report feeling a little sharper since he started taking the tivicay and maraviroc/placebo. We retested him today with the study's neurocognitive tests and there is some improvements noted in his scores. His pill counts are also on target for the reprieve study. He has not noticed any new problems or concerns. He will also be seeing Dr. Daiva EvesVan Dam this afternoon.
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
## 2015-12-24 ENCOUNTER — Encounter: Payer: Self-pay | Admitting: Infectious Disease

## 2015-12-31 ENCOUNTER — Other Ambulatory Visit: Payer: Self-pay | Admitting: Infectious Disease

## 2015-12-31 DIAGNOSIS — G4700 Insomnia, unspecified: Secondary | ICD-10-CM

## 2015-12-31 DIAGNOSIS — I10 Essential (primary) hypertension: Secondary | ICD-10-CM

## 2016-01-30 ENCOUNTER — Other Ambulatory Visit: Payer: Self-pay | Admitting: Infectious Disease

## 2016-02-01 ENCOUNTER — Telehealth: Payer: Self-pay

## 2016-02-01 NOTE — Telephone Encounter (Signed)
Returned patient call for refill request for Lomotil. Called patient to ask duration of  loose stools and are there any accompanying symptoms such as fever. Left message for patient to return phone call to clinic. Rejeana Brockandace Murray, LPN   Patient returned phone call and stated that he has been having loose stools off and on for four or five years. Stated this episode of loose stools has been going on for a few days. Stated he has had no other symptoms such as cough or fever. Explained to patient that a message will be sent to Dr. Daiva EvesVan Dam about Lomotil refill.  Rejeana Brockandace Murray, LPN

## 2016-02-04 ENCOUNTER — Encounter: Payer: Self-pay | Admitting: Infectious Disease

## 2016-02-04 NOTE — Telephone Encounter (Signed)
Called patient and left message to notify him Dr. Daiva EvesVan Dam approved his request to refill the prescription of Lomotil. [facility]. Rejeana Brockandace Murray, LPN

## 2016-02-29 ENCOUNTER — Encounter: Payer: Self-pay | Admitting: Infectious Disease

## 2016-02-29 ENCOUNTER — Other Ambulatory Visit: Payer: Self-pay | Admitting: Infectious Disease

## 2016-03-26 ENCOUNTER — Encounter (INDEPENDENT_AMBULATORY_CARE_PROVIDER_SITE_OTHER): Payer: Medicare Other | Admitting: *Deleted

## 2016-03-26 ENCOUNTER — Other Ambulatory Visit (HOSPITAL_COMMUNITY)
Admission: RE | Admit: 2016-03-26 | Discharge: 2016-03-26 | Disposition: A | Discharge status: Home / Self Care | Payer: Medicare Other | Source: Ambulatory Visit | Attending: Infectious Disease | Admitting: Infectious Disease

## 2016-03-26 ENCOUNTER — Encounter: Payer: Self-pay | Admitting: Infectious Disease

## 2016-03-26 ENCOUNTER — Ambulatory Visit (INDEPENDENT_AMBULATORY_CARE_PROVIDER_SITE_OTHER): Payer: Medicare Other | Admitting: Infectious Disease

## 2016-03-26 VITALS — BP 133/77 | HR 83 | Temp 98.3°F | Wt 150.0 lb

## 2016-03-26 DIAGNOSIS — M62838 Other muscle spasm: Secondary | ICD-10-CM

## 2016-03-26 DIAGNOSIS — R29818 Other symptoms and signs involving the nervous system: Secondary | ICD-10-CM

## 2016-03-26 DIAGNOSIS — R64 Cachexia: Secondary | ICD-10-CM

## 2016-03-26 DIAGNOSIS — Z113 Encounter for screening for infections with a predominantly sexual mode of transmission: Secondary | ICD-10-CM | POA: Insufficient documentation

## 2016-03-26 DIAGNOSIS — B20 Human immunodeficiency virus [HIV] disease: Secondary | ICD-10-CM | POA: Diagnosis not present

## 2016-03-26 DIAGNOSIS — F0280 Dementia in other diseases classified elsewhere without behavioral disturbance: Secondary | ICD-10-CM

## 2016-03-26 DIAGNOSIS — R2689 Other abnormalities of gait and mobility: Secondary | ICD-10-CM

## 2016-03-26 DIAGNOSIS — K8689 Other specified diseases of pancreas: Secondary | ICD-10-CM

## 2016-03-26 DIAGNOSIS — G969 Disorder of central nervous system, unspecified: Secondary | ICD-10-CM

## 2016-03-26 DIAGNOSIS — I10 Essential (primary) hypertension: Secondary | ICD-10-CM

## 2016-03-26 DIAGNOSIS — Z006 Encounter for examination for normal comparison and control in clinical research program: Secondary | ICD-10-CM

## 2016-03-26 HISTORY — DX: Other muscle spasm: M62.838

## 2016-03-26 HISTORY — DX: Other abnormalities of gait and mobility: R26.89

## 2016-03-26 LAB — CBC WITH DIFFERENTIAL/PLATELET
BASOS ABS: 0 {cells}/uL (ref 0–200)
Basophils Relative: 0 %
EOS ABS: 35 {cells}/uL (ref 15–500)
Eosinophils Relative: 1 %
HCT: 33.3 % — ABNORMAL LOW (ref 38.5–50.0)
HEMOGLOBIN: 11.1 g/dL — AB (ref 13.2–17.1)
Lymphocytes Relative: 13 %
Lymphs Abs: 455 cells/uL — ABNORMAL LOW (ref 850–3900)
MCH: 25.9 pg — AB (ref 27.0–33.0)
MCHC: 33.3 g/dL (ref 32.0–36.0)
MCV: 77.8 fL — AB (ref 80.0–100.0)
MPV: 10 fL (ref 7.5–12.5)
Monocytes Absolute: 315 cells/uL (ref 200–950)
Monocytes Relative: 9 %
Neutro Abs: 2695 cells/uL (ref 1500–7800)
Neutrophils Relative %: 77 %
Platelets: 50 10*3/uL — ABNORMAL LOW (ref 140–400)
RBC: 4.28 MIL/uL (ref 4.20–5.80)
RDW: 16 % — ABNORMAL HIGH (ref 11.0–15.0)
WBC: 3.5 10*3/uL — AB (ref 3.8–10.8)

## 2016-03-26 LAB — COMPLETE METABOLIC PANEL WITH GFR
ALBUMIN: 4.1 g/dL (ref 3.6–5.1)
ALK PHOS: 80 U/L (ref 40–115)
ALT: 12 U/L (ref 9–46)
AST: 18 U/L (ref 10–35)
BUN: 9 mg/dL (ref 7–25)
CALCIUM: 8.7 mg/dL (ref 8.6–10.3)
CO2: 25 mmol/L (ref 20–31)
CREATININE: 1.01 mg/dL (ref 0.70–1.25)
Chloride: 107 mmol/L (ref 98–110)
GFR, Est Non African American: 79 mL/min (ref >=60)
Glucose, Bld: 85 mg/dL (ref 65–99)
POTASSIUM: 4 mmol/L (ref 3.5–5.3)
Sodium: 140 mmol/L (ref 135–146)
Total Bilirubin: 1.1 mg/dL (ref 0.2–1.2)
Total Protein: 6.6 g/dL (ref 6.1–8.1)

## 2016-03-26 LAB — TSH: TSH: 1.95 mIU/L (ref 0.40–4.50)

## 2016-03-26 LAB — CALCIUM: Calcium: 8.7 mg/dL (ref 8.6–10.3)

## 2016-03-26 LAB — T4: T4, Total: 5.7 ug/dL (ref 4.5–12.0)

## 2016-03-26 NOTE — Addendum Note (Signed)
Addended by: Mariea ClontsGREEN, Jahshua Bonito D on: 03/26/2016 03:37 PM   Modules accepted: Orders
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
## 2016-03-26 NOTE — Addendum Note (Signed)
Addended by: Mariea ClontsGREEN, Chibuike Fleek D on: 03/26/2016 02:28 PM   Modules accepted: Orders

## 2016-03-26 NOTE — Progress Notes (Signed)
Noah Henderson is here today for a month 24 visit for Reprieve, A Randomized Trial to Prevent Vascular Events in HIV (study drug is Pitavastatin 4mg  or placebo). He denies any new problems, but does say that his legs and hands are cramping a lot, and that he let Dr. Daiva EvesVan Dam know about it at the visit today. He denies muscle weakness or pain, but does have joint pain and stiffness. He says his adherence is very good for study drugs. The next visit he has will be for the A5324 visit in August. We did give him his week 36 supply of meds for A5324 at this visit.

## 2016-03-27 ENCOUNTER — Other Ambulatory Visit: Payer: Self-pay | Admitting: Infectious Disease

## 2016-03-27 LAB — CYTOLOGY, (ORAL, ANAL, URETHRAL) ANCILLARY ONLY
CHLAMYDIA, DNA PROBE: NEGATIVE
Chlamydia: NEGATIVE
NEISSERIA GONORRHEA: NEGATIVE
Neisseria Gonorrhea: NEGATIVE

## 2016-03-27 LAB — URINE CYTOLOGY ANCILLARY ONLY
CHLAMYDIA, DNA PROBE: NEGATIVE
Neisseria Gonorrhea: NEGATIVE

## 2016-03-28 ENCOUNTER — Other Ambulatory Visit: Payer: Self-pay | Admitting: *Deleted

## 2016-03-28 DIAGNOSIS — F4323 Adjustment disorder with mixed anxiety and depressed mood: Secondary | ICD-10-CM

## 2016-03-28 MED ORDER — CLONAZEPAM 2 MG PO TABS: 2.0000 mg | ORAL_TABLET | Freq: Two times a day (BID) | ORAL | Status: DC

## 2016-03-28 MED ORDER — PROMETHAZINE HCL 25 MG PO TABS: ORAL_TABLET | ORAL | Status: DC

## 2016-04-15 ENCOUNTER — Telehealth: Payer: Self-pay

## 2016-04-15 NOTE — Telephone Encounter (Signed)
I am great with OT as well and will sign whatever I need to thanks Atka

## 2016-04-15 NOTE — Telephone Encounter (Signed)
The PT office called to advise they received the referral sent by Candace today. After reviewing the referral she advised the patient needs to have OT as well if he needs help with functions such as writing. She advised if the doctor wants the OT componant he could fax an order to [phone]. Advised will ask the doctor and let the LP know.

## 2016-04-15 NOTE — Telephone Encounter (Signed)
Faxed to physical therapy referral to [facility]. Fax returned OK status. Rejeana Brock, LPN

## 2016-04-18 NOTE — Telephone Encounter (Signed)
Re-faxed Dr.Van Dam referral order adding occupational therapy evaluation to form. Fax returned an OK status. Candace Murray,LPN

## 2016-05-13 ENCOUNTER — Encounter (INDEPENDENT_AMBULATORY_CARE_PROVIDER_SITE_OTHER): Payer: Medicare Other | Admitting: *Deleted

## 2016-05-13 VITALS — BP 99/63 | HR 75 | Temp 97.9°F | Resp 18 | Wt 150.8 lb

## 2016-05-13 DIAGNOSIS — Z23 Encounter for immunization: Secondary | ICD-10-CM

## 2016-05-13 DIAGNOSIS — Z006 Encounter for examination for normal comparison and control in clinical research program: Secondary | ICD-10-CM

## 2016-05-13 LAB — COMPREHENSIVE METABOLIC PANEL
ALT: 11 U/L (ref 9–46)
AST: 20 U/L (ref 10–35)
Albumin: 4 g/dL (ref 3.6–5.1)
Alkaline Phosphatase: 73 U/L (ref 40–115)
BILIRUBIN TOTAL: 1 mg/dL (ref 0.2–1.2)
BUN: 7 mg/dL (ref 7–25)
CALCIUM: 8.6 mg/dL (ref 8.6–10.3)
CO2: 23 mmol/L (ref 20–31)
Chloride: 107 mmol/L (ref 98–110)
Creat: 0.93 mg/dL (ref 0.70–1.25)
Glucose, Bld: 98 mg/dL (ref 65–99)
POTASSIUM: 4 mmol/L (ref 3.5–5.3)
Sodium: 139 mmol/L (ref 135–146)
TOTAL PROTEIN: 6.6 g/dL (ref 6.1–8.1)

## 2016-05-13 LAB — CD4/CD8 (T-HELPER/T-SUPPRESSOR CELL)
CD4%: 31.1
CD4: 218
CD8 % Suppressor T Cell: 28.7
CD8: 201

## 2016-05-13 LAB — HIV-1 RNA QUANT-NO REFLEX-BLD

## 2016-05-13 LAB — PHOSPHORUS: PHOSPHORUS: 3.3 mg/dL (ref 2.5–4.5)

## 2016-05-13 NOTE — Progress Notes (Signed)
Noah DanceKeith Gery Pray([place]) is here for his week 48 study visit for A5324: A Randomized Double Blinded, Placebo Controlled Trial Comparing Antiretroviral Intensification with Maraviroc or Dolutegravir for the Treatment of Cognitive Impairment in HIV. He says his adherence has been good. He usually takes the am dose around 12 noon and the Pm dose at 9pm. He says he thinks that his cognition and memory are better. He denies any new problems or medications. 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 He will return in November for the Reprieve study.

## 2016-05-29 ENCOUNTER — Encounter: Payer: Self-pay | Admitting: Infectious Disease

## 2016-06-04 ENCOUNTER — Encounter: Payer: Self-pay | Admitting: Infectious Disease

## 2016-06-20 ENCOUNTER — Other Ambulatory Visit: Payer: Self-pay | Admitting: Infectious Disease

## 2016-06-20 ENCOUNTER — Telehealth: Payer: Self-pay | Admitting: *Deleted

## 2016-06-20 DIAGNOSIS — G4700 Insomnia, unspecified: Secondary | ICD-10-CM

## 2016-06-20 DIAGNOSIS — I10 Essential (primary) hypertension: Secondary | ICD-10-CM

## 2016-06-20 DIAGNOSIS — B20 Human immunodeficiency virus [HIV] disease: Secondary | ICD-10-CM

## 2016-06-20 NOTE — Telephone Encounter (Signed)
Called [facility] and switched patient to Metoprolol 100 mg ER once daily.

## 2016-06-20 NOTE — Telephone Encounter (Signed)
Atenolol 50 mg on national back order. [facility] calling to see if there is an appropriate substitute, perhaps metoprolol? Please advise. [facility] [phone] - [address] [place], [place]: [phone]. Andree CossHowell, Nysir Fergusson M, RN

## 2016-06-20 NOTE — Telephone Encounter (Signed)
Thanks you Cassie!

## 2016-07-14 ENCOUNTER — Other Ambulatory Visit: Payer: Self-pay | Admitting: Infectious Disease

## 2016-07-14 DIAGNOSIS — F4323 Adjustment disorder with mixed anxiety and depressed mood: Secondary | ICD-10-CM

## 2016-07-16 ENCOUNTER — Other Ambulatory Visit: Payer: Self-pay | Admitting: Infectious Disease

## 2016-07-16 DIAGNOSIS — F4323 Adjustment disorder with mixed anxiety and depressed mood: Secondary | ICD-10-CM

## 2016-07-29 ENCOUNTER — Encounter (INDEPENDENT_AMBULATORY_CARE_PROVIDER_SITE_OTHER): Payer: Self-pay | Admitting: *Deleted

## 2016-07-29 VITALS — BP 114/63 | HR 62 | Temp 97.8°F | Wt 154.8 lb

## 2016-07-29 DIAGNOSIS — Z006 Encounter for examination for normal comparison and control in clinical research program: Secondary | ICD-10-CM

## 2016-07-29 NOTE — Progress Notes (Signed)
Mellody DanceKeith Gery Pray([place]) is here for his week 28 visit [id number], A Randomized Trial to Prevent Vascular Events in HIV (The REPRIEVE Study). No new complaints or concerns verbalized. Denies any muscle aches or weakness. Excellent adherence with study medication. Denies any missed doses. He will return in February for his next study visit for A5332 & [id number] and will see Dr. Daiva EvesVan Dam at that time.

## 2016-08-13 ENCOUNTER — Other Ambulatory Visit: Payer: Self-pay | Admitting: Infectious Disease

## 2016-08-13 DIAGNOSIS — K219 Gastro-esophageal reflux disease without esophagitis: Secondary | ICD-10-CM

## 2016-08-20 ENCOUNTER — Other Ambulatory Visit: Payer: Self-pay | Admitting: Infectious Disease

## 2016-08-25 ENCOUNTER — Other Ambulatory Visit: Payer: Self-pay | Admitting: Infectious Disease

## 2016-08-25 DIAGNOSIS — Z113 Encounter for screening for infections with a predominantly sexual mode of transmission: Secondary | ICD-10-CM

## 2016-08-25 DIAGNOSIS — B20 Human immunodeficiency virus [HIV] disease: Secondary | ICD-10-CM

## 2016-08-25 DIAGNOSIS — Z21 Asymptomatic human immunodeficiency virus [HIV] infection status: Secondary | ICD-10-CM

## 2016-09-17 ENCOUNTER — Other Ambulatory Visit: Payer: Self-pay | Admitting: Infectious Disease

## 2016-09-17 DIAGNOSIS — B20 Human immunodeficiency virus [HIV] disease: Secondary | ICD-10-CM

## 2016-09-19 ENCOUNTER — Encounter: Payer: Self-pay | Admitting: Infectious Disease

## 2016-09-19 ENCOUNTER — Telehealth: Payer: Self-pay | Admitting: *Deleted

## 2016-09-19 NOTE — Telephone Encounter (Signed)
Patient requesting letter with specific information.  RN called patient, left message asking him to call back regarding the reason for this letter and also to let him know that Dr Daiva EvesVan Dam is out of the office until the new year. Andree CossHowell, Diyari Cherne M, RN  ___________ Request: Toniann KetHello,  Hope everyone is doing ok and had a Altamese CabalMerry Christmas and I am wishing each and every one of you a Happy New Year.  I have a favor to ask. I need a letter typed up from Doctor Daiva EvesVan Dam that kinds of details my medical condition without going into the actual medical condition of HIV +.   I need this printed on [facility] letterhead and just signed by the Doctor without using his actual  practicing name.  I need the letter to state that I should not be out in the cold, damp and freezing weather because of possible infection and something that could be detrimental to my health.  You could state that I am on 9 medications, have balance problems, legs cramps, diarrhea, loss of weight, not be able to sit for long periods at a time, arthritis and cramps in my hands, and any other thing that could be put in the letter but mainly not to be out in freezing or cold weather below 50 - 60 degrees that would subject me to any kind of an infection .   You can e-mail the letter to me as a PDF or fax it to me at [phone].    Thank you for your assistance in the handling of this matter.    Duard BradyKeith Buntin   in regards to the letter, you can just title it--------    To Whom it May Concern regarding Lou CalBarry Keith Tisdel and his medical condition and issues involving it.    I also forgot to mention having to take the Clonazepam to be able to get some sleep and having to take it just to take a nap.    Thanks again for your assistance in this matter.    Duard BradyKeith Ebers

## 2016-09-23 ENCOUNTER — Encounter: Payer: Self-pay | Admitting: Infectious Disease

## 2016-09-24 ENCOUNTER — Encounter: Payer: Self-pay | Admitting: Infectious Disease

## 2016-09-24 NOTE — Telephone Encounter (Signed)
Letter written

## 2016-10-15 ENCOUNTER — Other Ambulatory Visit: Payer: Self-pay | Admitting: Infectious Disease

## 2016-10-15 DIAGNOSIS — F4323 Adjustment disorder with mixed anxiety and depressed mood: Secondary | ICD-10-CM

## 2016-10-15 DIAGNOSIS — G4700 Insomnia, unspecified: Secondary | ICD-10-CM

## 2016-10-15 DIAGNOSIS — B20 Human immunodeficiency virus [HIV] disease: Secondary | ICD-10-CM

## 2016-10-17 ENCOUNTER — Encounter: Payer: Self-pay | Admitting: Infectious Disease

## 2016-10-17 ENCOUNTER — Other Ambulatory Visit: Payer: Self-pay | Admitting: *Deleted

## 2016-10-17 DIAGNOSIS — F4323 Adjustment disorder with mixed anxiety and depressed mood: Secondary | ICD-10-CM

## 2016-10-17 DIAGNOSIS — B20 Human immunodeficiency virus [HIV] disease: Secondary | ICD-10-CM

## 2016-10-17 MED ORDER — CLONAZEPAM 2 MG PO TABS: 2.0000 mg | ORAL_TABLET | Freq: Two times a day (BID) | ORAL | 2 refills | Status: DC | PRN

## 2016-10-17 MED ORDER — DIPHENOXYLATE-ATROPINE 2.5-0.025 MG PO TABS: 1.0000 | ORAL_TABLET | Freq: Two times a day (BID) | ORAL | 2 refills | Status: DC | PRN

## 2016-11-05 ENCOUNTER — Ambulatory Visit: Payer: Medicare Other | Admitting: Infectious Disease

## 2016-11-12 ENCOUNTER — Other Ambulatory Visit: Payer: Self-pay | Admitting: Infectious Disease

## 2016-11-12 DIAGNOSIS — K8689 Other specified diseases of pancreas: Secondary | ICD-10-CM

## 2016-11-18 ENCOUNTER — Encounter (INDEPENDENT_AMBULATORY_CARE_PROVIDER_SITE_OTHER): Payer: Medicare Other | Admitting: *Deleted

## 2016-11-18 VITALS — BP 127/76 | HR 80 | Temp 97.6°F | Wt 161.0 lb

## 2016-11-18 DIAGNOSIS — Z006 Encounter for examination for normal comparison and control in clinical research program: Secondary | ICD-10-CM

## 2016-11-18 LAB — COMPREHENSIVE METABOLIC PANEL
ALBUMIN: 4.1 g/dL (ref 3.6–5.1)
ALK PHOS: 72 U/L (ref 40–115)
ALT: 11 U/L (ref 9–46)
AST: 18 U/L (ref 10–35)
BILIRUBIN TOTAL: 1.1 mg/dL (ref 0.2–1.2)
BUN: 6 mg/dL — ABNORMAL LOW (ref 7–25)
CHLORIDE: 106 mmol/L (ref 98–110)
CO2: 27 mmol/L (ref 20–31)
CREATININE: 1 mg/dL (ref 0.70–1.25)
Calcium: 9 mg/dL (ref 8.6–10.3)
Glucose, Bld: 85 mg/dL (ref 65–99)
Potassium: 4 mmol/L (ref 3.5–5.3)
SODIUM: 140 mmol/L (ref 135–146)
TOTAL PROTEIN: 7.1 g/dL (ref 6.1–8.1)

## 2016-11-18 LAB — PHOSPHORUS: PHOSPHORUS: 2.9 mg/dL (ref 2.5–4.5)

## 2016-11-18 NOTE — Progress Notes (Signed)
Noah Henderson is here for both A5324 study visit, week 72 and Reprieve. He denies any new problems except for some muscle cramping in his legs occasionally. He says he is very adherent with his study meds. We have scheduled him to see Dr. Daiva EvesVan Dam in April and he will retrun for the next study visit in May.

## 2016-11-19 ENCOUNTER — Ambulatory Visit: Payer: Medicare Other | Admitting: Infectious Disease

## 2016-11-29 LAB — HIV-1 RNA QUANT-NO REFLEX-BLD: HIV-1 RNA Viral Load: <40

## 2016-12-04 ENCOUNTER — Encounter: Payer: Self-pay | Admitting: *Deleted

## 2016-12-04 LAB — CD4/CD8 (T-HELPER/T-SUPPRESSOR CELL)
CD4 COUNT: 269
CD4%: 33.6
CD8 % Suppressor T Cell: 30.5
CD8 T CELL ABS: 244

## 2017-01-14 ENCOUNTER — Ambulatory Visit (INDEPENDENT_AMBULATORY_CARE_PROVIDER_SITE_OTHER): Payer: Medicare Other | Admitting: Infectious Disease

## 2017-01-14 ENCOUNTER — Encounter: Payer: Self-pay | Admitting: Infectious Disease

## 2017-01-14 VITALS — BP 134/76 | HR 80 | Temp 98.3°F | Wt 148.0 lb

## 2017-01-14 DIAGNOSIS — B20 Human immunodeficiency virus [HIV] disease: Secondary | ICD-10-CM

## 2017-01-14 DIAGNOSIS — Z113 Encounter for screening for infections with a predominantly sexual mode of transmission: Secondary | ICD-10-CM | POA: Diagnosis not present

## 2017-01-14 DIAGNOSIS — I10 Essential (primary) hypertension: Secondary | ICD-10-CM

## 2017-01-14 DIAGNOSIS — D696 Thrombocytopenia, unspecified: Secondary | ICD-10-CM

## 2017-01-14 DIAGNOSIS — Z79899 Other long term (current) drug therapy: Secondary | ICD-10-CM | POA: Diagnosis not present

## 2017-01-14 DIAGNOSIS — K8689 Other specified diseases of pancreas: Secondary | ICD-10-CM

## 2017-01-14 DIAGNOSIS — G969 Disorder of central nervous system, unspecified: Secondary | ICD-10-CM

## 2017-01-14 DIAGNOSIS — R64 Cachexia: Secondary | ICD-10-CM

## 2017-01-14 NOTE — Addendum Note (Signed)
Addended by: Mariea Clonts D on: 01/14/2017 05:09 PM   Modules accepted: Orders
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
## 2017-01-15 ENCOUNTER — Encounter: Payer: Self-pay | Admitting: Infectious Disease

## 2017-01-15 ENCOUNTER — Other Ambulatory Visit: Payer: Self-pay | Admitting: Infectious Disease

## 2017-01-15 DIAGNOSIS — B20 Human immunodeficiency virus [HIV] disease: Secondary | ICD-10-CM

## 2017-01-15 DIAGNOSIS — F4323 Adjustment disorder with mixed anxiety and depressed mood: Secondary | ICD-10-CM

## 2017-01-16 ENCOUNTER — Encounter: Payer: Self-pay | Admitting: Infectious Disease

## 2017-01-22 ENCOUNTER — Other Ambulatory Visit: Payer: Self-pay | Admitting: Infectious Disease

## 2017-01-22 DIAGNOSIS — F4323 Adjustment disorder with mixed anxiety and depressed mood: Secondary | ICD-10-CM

## 2017-01-23 ENCOUNTER — Encounter: Payer: Self-pay | Admitting: Infectious Disease

## 2017-02-18 ENCOUNTER — Other Ambulatory Visit: Payer: Self-pay | Admitting: Infectious Disease

## 2017-02-18 DIAGNOSIS — K219 Gastro-esophageal reflux disease without esophagitis: Secondary | ICD-10-CM

## 2017-02-18 DIAGNOSIS — Z113 Encounter for screening for infections with a predominantly sexual mode of transmission: Secondary | ICD-10-CM

## 2017-02-18 DIAGNOSIS — Z21 Asymptomatic human immunodeficiency virus [HIV] infection status: Secondary | ICD-10-CM

## 2017-02-18 DIAGNOSIS — B20 Human immunodeficiency virus [HIV] disease: Secondary | ICD-10-CM

## 2017-02-20 ENCOUNTER — Other Ambulatory Visit: Payer: Self-pay | Admitting: Infectious Disease

## 2017-02-20 ENCOUNTER — Other Ambulatory Visit: Payer: Self-pay | Admitting: *Deleted

## 2017-02-20 DIAGNOSIS — B20 Human immunodeficiency virus [HIV] disease: Secondary | ICD-10-CM

## 2017-02-20 MED ORDER — DIPHENOXYLATE-ATROPINE 2.5-0.025 MG PO TABS: ORAL_TABLET | ORAL | 2 refills | Status: DC

## 2017-03-23 ENCOUNTER — Encounter: Payer: Self-pay | Admitting: Infectious Disease

## 2017-03-23 ENCOUNTER — Other Ambulatory Visit: Payer: Self-pay | Admitting: Infectious Disease

## 2017-03-23 DIAGNOSIS — B20 Human immunodeficiency virus [HIV] disease: Secondary | ICD-10-CM

## 2017-03-23 DIAGNOSIS — K8689 Other specified diseases of pancreas: Secondary | ICD-10-CM

## 2017-03-24 ENCOUNTER — Other Ambulatory Visit: Payer: Self-pay | Admitting: Infectious Disease

## 2017-03-24 ENCOUNTER — Encounter (INDEPENDENT_AMBULATORY_CARE_PROVIDER_SITE_OTHER): Payer: Medicare Other | Admitting: *Deleted

## 2017-03-24 ENCOUNTER — Encounter: Payer: Self-pay | Admitting: *Deleted

## 2017-03-24 VITALS — BP 116/72 | HR 66 | Temp 97.5°F | Wt 145.2 lb

## 2017-03-24 DIAGNOSIS — F4323 Adjustment disorder with mixed anxiety and depressed mood: Secondary | ICD-10-CM

## 2017-03-24 DIAGNOSIS — Z006 Encounter for examination for normal comparison and control in clinical research program: Secondary | ICD-10-CM

## 2017-03-24 DIAGNOSIS — G4700 Insomnia, unspecified: Secondary | ICD-10-CM

## 2017-03-24 DIAGNOSIS — B20 Human immunodeficiency virus [HIV] disease: Secondary | ICD-10-CM

## 2017-03-24 LAB — POCT URINALYSIS DIPSTICK
Blood, UA: NEGATIVE
Glucose, UA: NEGATIVE
LEUKOCYTES UA: NEGATIVE
Nitrite, UA: NEGATIVE
PH UA: 5.5 (ref 5.0–8.0)
Spec Grav, UA: >=1.03 — AB (ref 1.010–1.025)
UROBILINOGEN UA: 0.2 U/dL

## 2017-03-24 LAB — COMPREHENSIVE METABOLIC PANEL
ALT: 29 U/L (ref 9–46)
AST: 38 U/L — ABNORMAL HIGH (ref 10–35)
Albumin: 3.8 g/dL (ref 3.6–5.1)
Alkaline Phosphatase: 75 U/L (ref 40–115)
BUN: 14 mg/dL (ref 7–25)
CHLORIDE: 108 mmol/L (ref 98–110)
CO2: 22 mmol/L (ref 20–31)
CREATININE: 0.86 mg/dL (ref 0.70–1.25)
Calcium: 8.8 mg/dL (ref 8.6–10.3)
GLUCOSE: 110 mg/dL — AB (ref 65–99)
POTASSIUM: 3.7 mmol/L (ref 3.5–5.3)
SODIUM: 139 mmol/L (ref 135–146)
Total Bilirubin: 0.8 mg/dL (ref 0.2–1.2)
Total Protein: 6.4 g/dL (ref 6.1–8.1)

## 2017-03-24 LAB — LIPID PANEL
CHOLESTEROL: 121 mg/dL (ref <200)
HDL: 33 mg/dL — ABNORMAL LOW (ref >40)
LDL Cholesterol: 69 mg/dL (ref <100)
Total CHOL/HDL Ratio: 3.7 Ratio (ref <5.0)
Triglycerides: 95 mg/dL (ref <150)
VLDL: 19 mg/dL (ref <30)

## 2017-03-24 LAB — PHOSPHORUS: Phosphorus: 2 mg/dL — ABNORMAL LOW (ref 2.5–4.5)

## 2017-03-24 LAB — HIV-1 RNA QUANT-NO REFLEX-BLD
CD4 COUNT: 246
CD4 T CELL HELPER: 30.8
CD8 T CELL SUPPRESSOR: 32.1
CD8 T Cell Abs: 257
HIV-1 RNA Viral Load: <40

## 2017-03-24 NOTE — Addendum Note (Signed)
Addended by: Mariea ClontsGREEN, Danel Studzinski D on: 03/24/2017 12:00 PM   Modules accepted: Orders

## 2017-03-24 NOTE — Progress Notes (Signed)
Noah Henderson is here for his final visit for A5324: A Randomized Double Blinded, Placebo Controlled Trial Comparing Antiretroviral Intensification with Maraviroc or Dolutegravir for the Treatment of Cognitive Impairment in HIV And month 36 for Reprieve, A Randomized Trial to Prevent Vascular Events in HIV (study drug is Pitavastatin 4mg  or placebo).  He denies any new problems or concerns or new medications. He forgot to bring his A5324 meds back and I told him to stop taking them and bring them back at his next Reprieve visit. He says he has been very adherent with his study meds for both A5324 and Reprieve. He will return in October for the next Reprieve visit.

## 2017-03-27 LAB — RPR

## 2017-04-16 ENCOUNTER — Encounter: Payer: Self-pay | Admitting: Infectious Disease

## 2017-04-22 ENCOUNTER — Encounter: Payer: Self-pay | Admitting: *Deleted

## 2017-05-20 ENCOUNTER — Other Ambulatory Visit: Payer: Self-pay | Admitting: Infectious Disease

## 2017-05-20 DIAGNOSIS — B20 Human immunodeficiency virus [HIV] disease: Secondary | ICD-10-CM

## 2017-05-20 DIAGNOSIS — F4323 Adjustment disorder with mixed anxiety and depressed mood: Secondary | ICD-10-CM

## 2017-05-22 ENCOUNTER — Encounter: Payer: Self-pay | Admitting: Infectious Disease

## 2017-05-22 ENCOUNTER — Other Ambulatory Visit: Payer: Self-pay | Admitting: *Deleted

## 2017-05-22 DIAGNOSIS — B20 Human immunodeficiency virus [HIV] disease: Secondary | ICD-10-CM

## 2017-05-22 DIAGNOSIS — F4323 Adjustment disorder with mixed anxiety and depressed mood: Secondary | ICD-10-CM

## 2017-05-22 MED ORDER — CLONAZEPAM 2 MG PO TABS: 2.0000 mg | ORAL_TABLET | Freq: Two times a day (BID) | ORAL | 3 refills | Status: DC | PRN

## 2017-05-22 MED ORDER — DIPHENOXYLATE-ATROPINE 2.5-0.025 MG PO TABS: ORAL_TABLET | ORAL | 2 refills | Status: DC

## 2017-05-26 ENCOUNTER — Encounter: Payer: Self-pay | Admitting: Infectious Disease

## 2017-05-29 ENCOUNTER — Encounter: Payer: Self-pay | Admitting: *Deleted

## 2017-06-01 ENCOUNTER — Encounter: Payer: Self-pay | Admitting: Infectious Disease

## 2017-06-03 ENCOUNTER — Ambulatory Visit: Payer: Medicare Other | Admitting: Infectious Disease

## 2017-06-17 ENCOUNTER — Ambulatory Visit (INDEPENDENT_AMBULATORY_CARE_PROVIDER_SITE_OTHER): Payer: Medicare Other | Admitting: Infectious Disease

## 2017-06-17 ENCOUNTER — Encounter: Payer: Self-pay | Admitting: Infectious Disease

## 2017-06-17 VITALS — BP 115/68 | HR 73 | Temp 98.4°F | Wt 129.1 lb

## 2017-06-17 DIAGNOSIS — D696 Thrombocytopenia, unspecified: Secondary | ICD-10-CM | POA: Diagnosis not present

## 2017-06-17 DIAGNOSIS — R634 Abnormal weight loss: Secondary | ICD-10-CM | POA: Diagnosis not present

## 2017-06-17 DIAGNOSIS — E881 Lipodystrophy, not elsewhere classified: Secondary | ICD-10-CM | POA: Diagnosis not present

## 2017-06-17 DIAGNOSIS — Z113 Encounter for screening for infections with a predominantly sexual mode of transmission: Secondary | ICD-10-CM | POA: Diagnosis not present

## 2017-06-17 DIAGNOSIS — R64 Cachexia: Secondary | ICD-10-CM

## 2017-06-17 DIAGNOSIS — G969 Disorder of central nervous system, unspecified: Secondary | ICD-10-CM | POA: Diagnosis not present

## 2017-06-17 DIAGNOSIS — B20 Human immunodeficiency virus [HIV] disease: Secondary | ICD-10-CM | POA: Diagnosis not present

## 2017-06-17 DIAGNOSIS — K8689 Other specified diseases of pancreas: Secondary | ICD-10-CM

## 2017-06-17 DIAGNOSIS — Z23 Encounter for immunization: Secondary | ICD-10-CM | POA: Diagnosis not present

## 2017-06-17 DIAGNOSIS — F0280 Dementia in other diseases classified elsewhere without behavioral disturbance: Secondary | ICD-10-CM | POA: Diagnosis not present

## 2017-06-17 HISTORY — DX: Abnormal weight loss: R63.4

## 2017-06-17 LAB — COMPLETE METABOLIC PANEL WITH GFR
AG Ratio: 1.9 (calc) (ref 1.0–2.5)
ALBUMIN MSPROF: 3.9 g/dL (ref 3.6–5.1)
ALKALINE PHOSPHATASE (APISO): 66 U/L (ref 40–115)
ALT: 13 U/L (ref 9–46)
AST: 20 U/L (ref 10–35)
BUN: 11 mg/dL (ref 7–25)
CO2: 28 mmol/L (ref 20–32)
CREATININE: 0.78 mg/dL (ref 0.70–1.25)
Calcium: 8.9 mg/dL (ref 8.6–10.3)
Chloride: 105 mmol/L (ref 98–110)
GFR, Est African American: 111 mL/min/{1.73_m2} (ref >=60)
GFR, Est Non African American: 95 mL/min/{1.73_m2} (ref >=60)
GLUCOSE: 89 mg/dL (ref 65–99)
Globulin: 2.1 g/dL (calc) (ref 1.9–3.7)
Potassium: 3.7 mmol/L (ref 3.5–5.3)
Sodium: 137 mmol/L (ref 135–146)
Total Bilirubin: 0.7 mg/dL (ref 0.2–1.2)
Total Protein: 6 g/dL — ABNORMAL LOW (ref 6.1–8.1)

## 2017-06-17 LAB — CBC WITH DIFFERENTIAL/PLATELET
BASOS PCT: 0.6 %
Basophils Absolute: 20 cells/uL (ref 0–200)
Eosinophils Absolute: 89 cells/uL (ref 15–500)
Eosinophils Relative: 2.7 %
HCT: 35.4 % — ABNORMAL LOW (ref 38.5–50.0)
Hemoglobin: 11.7 g/dL — ABNORMAL LOW (ref 13.2–17.1)
LYMPHS ABS: 660 {cells}/uL — AB (ref 850–3900)
MCH: 25.9 pg — ABNORMAL LOW (ref 27.0–33.0)
MCHC: 33.1 g/dL (ref 32.0–36.0)
MCV: 78.3 fL — ABNORMAL LOW (ref 80.0–100.0)
MONOS PCT: 8.5 %
MPV: 11.4 fL (ref 7.5–12.5)
NEUTROS ABS: 2251 {cells}/uL (ref 1500–7800)
Neutrophils Relative %: 68.2 %
PLATELETS: 52 10*3/uL — AB (ref 140–400)
RBC: 4.52 10*6/uL (ref 4.20–5.80)
RDW: 16.1 % — AB (ref 11.0–15.0)
TOTAL LYMPHOCYTE: 20 %
WBC: 3.3 10*3/uL — AB (ref 3.8–10.8)
WBCMIX: 281 {cells}/uL (ref 200–950)
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
## 2017-07-14 ENCOUNTER — Encounter (INDEPENDENT_AMBULATORY_CARE_PROVIDER_SITE_OTHER): Payer: Medicare Other | Admitting: Licensed Clinical Social Worker

## 2017-07-14 ENCOUNTER — Encounter: Payer: Self-pay | Admitting: Licensed Clinical Social Worker

## 2017-07-14 VITALS — BP 147/77 | HR 67 | Temp 98.6°F | Wt 128.8 lb

## 2017-07-14 DIAGNOSIS — Z006 Encounter for examination for normal comparison and control in clinical research program: Secondary | ICD-10-CM

## 2017-07-14 NOTE — Progress Notes (Signed)
Noah Henderson is here today for month 40 for Reprieve study visit, he denies any muscle cramps or weakness. He states that he is very adherent with his medications.He will be coming back for his next appointment on 10/27/2016 at 10:00am.

## 2017-08-18 ENCOUNTER — Encounter: Payer: Self-pay | Admitting: Infectious Disease

## 2017-08-19 ENCOUNTER — Other Ambulatory Visit: Payer: Self-pay | Admitting: Infectious Disease

## 2017-08-19 DIAGNOSIS — F02C0 Dementia in other diseases classified elsewhere, severe, without behavioral disturbance, psychotic disturbance, mood disturbance, and anxiety: Secondary | ICD-10-CM

## 2017-08-19 DIAGNOSIS — B20 Human immunodeficiency virus [HIV] disease: Secondary | ICD-10-CM

## 2017-08-19 DIAGNOSIS — F419 Anxiety disorder, unspecified: Secondary | ICD-10-CM

## 2017-08-19 DIAGNOSIS — F0280 Dementia in other diseases classified elsewhere without behavioral disturbance: Secondary | ICD-10-CM

## 2017-08-19 DIAGNOSIS — F5101 Primary insomnia: Secondary | ICD-10-CM

## 2017-08-19 NOTE — Telephone Encounter (Signed)
Maybe try either Rozerem 8mg  nightly or maybe remeron 15mg  nightly.

## 2017-08-25 ENCOUNTER — Encounter: Payer: Self-pay | Admitting: Infectious Disease

## 2017-08-26 ENCOUNTER — Other Ambulatory Visit: Payer: Self-pay | Admitting: Infectious Disease

## 2017-08-26 DIAGNOSIS — K219 Gastro-esophageal reflux disease without esophagitis: Secondary | ICD-10-CM

## 2017-08-26 DIAGNOSIS — Z113 Encounter for screening for infections with a predominantly sexual mode of transmission: Secondary | ICD-10-CM

## 2017-08-26 DIAGNOSIS — Z21 Asymptomatic human immunodeficiency virus [HIV] infection status: Secondary | ICD-10-CM

## 2017-08-26 DIAGNOSIS — B20 Human immunodeficiency virus [HIV] disease: Secondary | ICD-10-CM

## 2017-09-24 ENCOUNTER — Other Ambulatory Visit: Payer: Self-pay | Admitting: Infectious Disease

## 2017-09-24 DIAGNOSIS — B20 Human immunodeficiency virus [HIV] disease: Secondary | ICD-10-CM

## 2017-09-24 DIAGNOSIS — K8689 Other specified diseases of pancreas: Secondary | ICD-10-CM

## 2017-10-06 ENCOUNTER — Encounter: Payer: Self-pay | Admitting: Infectious Disease

## 2017-10-06 DIAGNOSIS — B20 Human immunodeficiency virus [HIV] disease: Secondary | ICD-10-CM

## 2017-10-07 MED ORDER — DIPHENOXYLATE-ATROPINE 2.5-0.025 MG PO TABS: ORAL_TABLET | ORAL | 5 refills | Status: DC

## 2017-10-07 NOTE — Addendum Note (Signed)
Addended by: Jennet MaduroESTRIDGE, DENISE D on: 10/07/2017 05:11 PM   Modules accepted: Orders

## 2017-10-21 ENCOUNTER — Encounter: Payer: Self-pay | Admitting: Infectious Disease

## 2017-10-21 ENCOUNTER — Ambulatory Visit (INDEPENDENT_AMBULATORY_CARE_PROVIDER_SITE_OTHER): Payer: Medicare Other | Admitting: Infectious Disease

## 2017-10-21 VITALS — BP 134/74 | HR 79 | Temp 98.1°F | Ht 68.0 in | Wt 128.0 lb

## 2017-10-21 DIAGNOSIS — G4700 Insomnia, unspecified: Secondary | ICD-10-CM | POA: Diagnosis not present

## 2017-10-21 DIAGNOSIS — F4323 Adjustment disorder with mixed anxiety and depressed mood: Secondary | ICD-10-CM

## 2017-10-21 DIAGNOSIS — I10 Essential (primary) hypertension: Secondary | ICD-10-CM | POA: Diagnosis not present

## 2017-10-21 DIAGNOSIS — B20 Human immunodeficiency virus [HIV] disease: Secondary | ICD-10-CM | POA: Diagnosis not present

## 2017-10-21 DIAGNOSIS — G969 Disorder of central nervous system, unspecified: Secondary | ICD-10-CM

## 2017-10-21 MED ORDER — DARUN-COBIC-EMTRICIT-TENOFAF 800-150-200-10 MG PO TABS: 1.0000 | ORAL_TABLET | Freq: Every day | ORAL | 11 refills | Status: DC
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
## 2017-10-23 ENCOUNTER — Other Ambulatory Visit: Payer: Self-pay | Admitting: Pharmacist

## 2017-10-23 ENCOUNTER — Other Ambulatory Visit: Payer: Self-pay

## 2017-10-23 ENCOUNTER — Encounter: Payer: Self-pay | Admitting: Infectious Disease

## 2017-10-23 ENCOUNTER — Other Ambulatory Visit: Payer: Self-pay | Admitting: Infectious Disease

## 2017-10-23 DIAGNOSIS — G4700 Insomnia, unspecified: Secondary | ICD-10-CM

## 2017-10-23 DIAGNOSIS — F4323 Adjustment disorder with mixed anxiety and depressed mood: Secondary | ICD-10-CM

## 2017-10-23 MED ORDER — METOPROLOL SUCCINATE ER 100 MG PO TB24: 100.0000 mg | ORAL_TABLET | Freq: Every day | ORAL | 0 refills | Status: DC

## 2017-10-23 MED ORDER — AMITRIPTYLINE HCL 50 MG PO TABS: 50.0000 mg | ORAL_TABLET | Freq: Every day | ORAL | 0 refills | Status: DC

## 2017-10-27 ENCOUNTER — Encounter (INDEPENDENT_AMBULATORY_CARE_PROVIDER_SITE_OTHER): Payer: Medicare Other | Admitting: *Deleted

## 2017-10-27 ENCOUNTER — Other Ambulatory Visit: Payer: Self-pay | Admitting: *Deleted

## 2017-10-27 ENCOUNTER — Other Ambulatory Visit: Payer: Medicare Other

## 2017-10-27 VITALS — BP 106/63 | HR 60 | Temp 98.3°F | Wt 128.0 lb

## 2017-10-27 DIAGNOSIS — F4323 Adjustment disorder with mixed anxiety and depressed mood: Secondary | ICD-10-CM

## 2017-10-27 DIAGNOSIS — B20 Human immunodeficiency virus [HIV] disease: Secondary | ICD-10-CM

## 2017-10-27 DIAGNOSIS — Z006 Encounter for examination for normal comparison and control in clinical research program: Secondary | ICD-10-CM

## 2017-10-27 MED ORDER — CLONAZEPAM 2 MG PO TABS: 2.0000 mg | ORAL_TABLET | Freq: Two times a day (BID) | ORAL | 0 refills | Status: DC | PRN

## 2017-10-27 NOTE — Progress Notes (Signed)
Printed 2/1 in error. Called in 2/5. Andree CossHowell, Aretta Stetzel M, RN

## 2017-10-27 NOTE — Progress Notes (Signed)
Noah Henderson is here today for Reprieve month 44. He says he has been having trouble sleeping at night and that the klonazepam usually helps him sleep but hasn't been working lately. Dr. Daiva EvesVan Dam has scheduled a referral for psychiatry. He also was concerned about his weight loss of 20 lbs over the past year. Noah Henderson is unsure about getting a CT scan right now and said he will let us know if he decides to. His weight is the same today as it was in October for the research visit.He denies any other new problems or concerns. He is having a viral load and CD$ drawn today for the clinic. He is due to return for research in June.

## 2017-10-28 LAB — T-HELPER CELL (CD4) - (RCID CLINIC ONLY)
CD4 % Helper T Cell: 22 % — ABNORMAL LOW (ref 33–55)
CD4 T Cell Abs: 170 /uL — ABNORMAL LOW (ref 400–2700)

## 2017-10-29 ENCOUNTER — Encounter: Payer: Self-pay | Admitting: Infectious Disease

## 2017-10-29 LAB — HIV-1 RNA QUANT-NO REFLEX-BLD
HIV 1 RNA QUANT: NOT DETECTED {copies}/mL
HIV-1 RNA QUANT, LOG: NOT DETECTED {Log_copies}/mL

## 2017-11-04 ENCOUNTER — Encounter: Payer: Self-pay | Admitting: Infectious Disease

## 2017-11-24 ENCOUNTER — Other Ambulatory Visit: Payer: Self-pay | Admitting: Infectious Disease

## 2017-11-24 DIAGNOSIS — F4323 Adjustment disorder with mixed anxiety and depressed mood: Secondary | ICD-10-CM

## 2017-11-27 ENCOUNTER — Encounter: Payer: Self-pay | Admitting: Infectious Disease

## 2017-11-30 ENCOUNTER — Other Ambulatory Visit: Payer: Self-pay | Admitting: *Deleted

## 2017-11-30 ENCOUNTER — Telehealth: Payer: Self-pay | Admitting: *Deleted

## 2017-11-30 DIAGNOSIS — F4323 Adjustment disorder with mixed anxiety and depressed mood: Secondary | ICD-10-CM

## 2017-11-30 MED ORDER — PROMETHAZINE HCL 25 MG PO TABS: ORAL_TABLET | ORAL | 2 refills | Status: DC

## 2017-11-30 NOTE — Telephone Encounter (Signed)
error 

## 2017-12-28 ENCOUNTER — Other Ambulatory Visit: Payer: Self-pay | Admitting: Infectious Disease

## 2017-12-28 DIAGNOSIS — K219 Gastro-esophageal reflux disease without esophagitis: Secondary | ICD-10-CM

## 2017-12-28 DIAGNOSIS — G4700 Insomnia, unspecified: Secondary | ICD-10-CM

## 2018-02-26 ENCOUNTER — Other Ambulatory Visit: Payer: Self-pay | Admitting: Infectious Disease

## 2018-02-26 DIAGNOSIS — F4323 Adjustment disorder with mixed anxiety and depressed mood: Secondary | ICD-10-CM

## 2018-03-02 ENCOUNTER — Encounter (INDEPENDENT_AMBULATORY_CARE_PROVIDER_SITE_OTHER): Payer: Self-pay | Admitting: *Deleted

## 2018-03-02 VITALS — BP 102/63 | HR 58 | Temp 97.4°F | Wt 130.5 lb

## 2018-03-02 DIAGNOSIS — B20 Human immunodeficiency virus [HIV] disease: Secondary | ICD-10-CM

## 2018-03-02 DIAGNOSIS — Z006 Encounter for examination for normal comparison and control in clinical research program: Secondary | ICD-10-CM

## 2018-03-02 LAB — COMPREHENSIVE METABOLIC PANEL
AG RATIO: 1.6 (calc) (ref 1.0–2.5)
ALBUMIN MSPROF: 3.9 g/dL (ref 3.6–5.1)
ALKALINE PHOSPHATASE (APISO): 82 U/L (ref 40–115)
ALT: 25 U/L (ref 9–46)
AST: 32 U/L (ref 10–35)
BILIRUBIN TOTAL: 0.9 mg/dL (ref 0.2–1.2)
BUN: 10 mg/dL (ref 7–25)
CHLORIDE: 109 mmol/L (ref 98–110)
CO2: 25 mmol/L (ref 20–32)
CREATININE: 0.84 mg/dL (ref 0.70–1.25)
Calcium: 8.6 mg/dL (ref 8.6–10.3)
GLOBULIN: 2.5 g/dL (ref 1.9–3.7)
Glucose, Bld: 107 mg/dL — ABNORMAL HIGH (ref 65–99)
POTASSIUM: 4.2 mmol/L (ref 3.5–5.3)
Sodium: 140 mmol/L (ref 135–146)
Total Protein: 6.4 g/dL (ref 6.1–8.1)

## 2018-03-02 LAB — CBC
HEMATOCRIT: 35.9 % — AB (ref 38.5–50.0)
HEMOGLOBIN: 12.5 g/dL — AB (ref 13.2–17.1)
MCH: 28 pg (ref 27.0–33.0)
MCHC: 34.8 g/dL (ref 32.0–36.0)
MCV: 80.5 fL (ref 80.0–100.0)
MPV: 11.5 fL (ref 7.5–12.5)
Platelets: 86 10*3/uL — ABNORMAL LOW (ref 140–400)
RBC: 4.46 10*6/uL (ref 4.20–5.80)
RDW: 13.9 % (ref 11.0–15.0)
WBC: 5.9 10*3/uL (ref 3.8–10.8)

## 2018-03-02 NOTE — Addendum Note (Signed)
Addended by: Phill MyronEPPERSON, KIMBERLY C on: 03/02/2018 11:51 AM   Modules accepted: Orders

## 2018-03-02 NOTE — Progress Notes (Signed)
Noah Henderson is here for his month 48 visit for Reprieve. He denies any new problems currently. His weight is starting to go back up a little which he is glad for. He wanted his HIV VL and CD4 checked again before he sees Dr. Daiva EvesVan Dam in July which we drew while he was here. He will be returning in October for the next study visit.

## 2018-03-03 LAB — T-HELPER CELL (CD4) - (RCID CLINIC ONLY)
CD4 % Helper T Cell: 25 % — ABNORMAL LOW (ref 33–55)
CD4 T Cell Abs: 200 /uL — ABNORMAL LOW (ref 400–2700)

## 2018-03-04 LAB — HIV-1 RNA QUANT-NO REFLEX-BLD
HIV 1 RNA Quant: <20 copies/mL
HIV-1 RNA QUANT, LOG: NOT DETECTED {Log_copies}/mL

## 2018-04-02 ENCOUNTER — Other Ambulatory Visit: Payer: Self-pay | Admitting: Infectious Disease

## 2018-04-02 DIAGNOSIS — K8689 Other specified diseases of pancreas: Secondary | ICD-10-CM

## 2018-04-02 DIAGNOSIS — F4323 Adjustment disorder with mixed anxiety and depressed mood: Secondary | ICD-10-CM

## 2018-04-02 DIAGNOSIS — B20 Human immunodeficiency virus [HIV] disease: Secondary | ICD-10-CM

## 2018-04-02 MED ORDER — PANCRELIPASE (LIP-PROT-AMYL) 24000-76000 UNITS PO CPEP
2.0000 | ORAL_CAPSULE | Freq: Three times a day (TID) | ORAL | 5 refills | Status: DC
Start: 2018-04-02 — End: 2018-09-21

## 2018-04-02 MED ORDER — DIPHENOXYLATE-ATROPINE 2.5-0.025 MG PO TABS: ORAL_TABLET | ORAL | 5 refills | Status: DC

## 2018-04-02 NOTE — Telephone Encounter (Signed)
Denied  Phenergan since I was not able to verify refill.   Laurell Josephsammy K King, RN

## 2018-04-05 ENCOUNTER — Encounter: Payer: Self-pay | Admitting: Infectious Disease

## 2018-04-05 NOTE — Telephone Encounter (Signed)
Patient is requesting a refill of phenergan. Is this an Rx we are going to fill in

## 2018-04-06 ENCOUNTER — Telehealth: Payer: Self-pay | Admitting: *Deleted

## 2018-04-06 NOTE — Telephone Encounter (Signed)
Patient called to ask for a refill on his phenergan. Thinks he should get this every month. Advised medication change and his nausea should be better. He advised not sure because he takes the medication daily. Advised will ask the provider and give him a call back once he responds.

## 2018-04-06 NOTE — Telephone Encounter (Signed)
I dont know why he should need phenergan regularly?

## 2018-04-07 ENCOUNTER — Other Ambulatory Visit: Payer: Self-pay | Admitting: Behavioral Health

## 2018-04-07 ENCOUNTER — Other Ambulatory Visit: Payer: Medicare Other

## 2018-04-07 DIAGNOSIS — B20 Human immunodeficiency virus [HIV] disease: Secondary | ICD-10-CM

## 2018-04-07 MED ORDER — DIPHENOXYLATE-ATROPINE 2.5-0.025 MG PO TABS: ORAL_TABLET | ORAL | 5 refills | Status: DC

## 2018-04-07 NOTE — Telephone Encounter (Signed)
Patient called and asked about his phenergan and advised him not a long term medication and we will not refill at this time. Patient advised he understands.

## 2018-04-21 ENCOUNTER — Ambulatory Visit (INDEPENDENT_AMBULATORY_CARE_PROVIDER_SITE_OTHER): Payer: Medicare Other | Admitting: Family

## 2018-04-21 ENCOUNTER — Encounter: Payer: Self-pay | Admitting: Family

## 2018-04-21 ENCOUNTER — Encounter: Payer: Self-pay | Admitting: Infectious Disease

## 2018-04-21 ENCOUNTER — Ambulatory Visit: Payer: Medicare Other

## 2018-04-21 VITALS — BP 138/76 | HR 76 | Temp 97.9°F | Wt 131.0 lb

## 2018-04-21 DIAGNOSIS — R2689 Other abnormalities of gait and mobility: Secondary | ICD-10-CM | POA: Diagnosis not present

## 2018-04-21 DIAGNOSIS — Z23 Encounter for immunization: Secondary | ICD-10-CM

## 2018-04-21 DIAGNOSIS — B20 Human immunodeficiency virus [HIV] disease: Secondary | ICD-10-CM | POA: Diagnosis not present

## 2018-04-21 NOTE — Addendum Note (Signed)
Addended by: Lurlean LeydenPOOLE, Tymber Stallings F on: 04/21/2018 03:06 PM   Modules accepted: Orders

## 2018-04-21 NOTE — Patient Instructions (Signed)
Nice to meet you!  Recommend increasing your water intake and limit tea and soda with goal of 60-80 oz of water per day.  Continue to take your Symtuza as prescribed.  Schedule a time in September for your flu shot with a nurse visit.  Plan for follow up in 6 months or sooner if needed with lab work 1-2 weeks prior to appointment.  We have updated your Prevnar and Tetanus vaccinations today.

## 2018-04-21 NOTE — Assessment & Plan Note (Signed)
[name] has well controlled HIV disease with a viral load that is undetectable with a CD4 count of 200. He is adherent with the medication and has no problems obtaining it. He has no symptoms of opportunistic infection presently through history or physical exam. Immunizations are up to date per recommendations. Continue current dose of Symtuza. Follow-up office visit in 6 months or sooner if needed with blood work 1-2 weeks prior to appointment. Encouraged to obtain flu shot in September with nurse visit.

## 2018-04-21 NOTE — Progress Notes (Signed)
Subjective:    Patient ID: Noah Henderson, male    DOB: 1951/02/12, 67 y.o.   MRN: [id number]  Chief Complaint  Patient presents with  . HIV Positive/AIDS     HPI:  Noah Henderson is a 67 y.o. male who presents today for routine follow up of HIV disease.  Noah Henderson was last seen in the office on 10/21/17 with well-controlled HIV disease and was switched to Symtuza. His most recent blood work completed on 03/02/18 showed a viral level that was undetectable and a CD4 count of 200 which was improved from 170. Most recent RPR on 03/24/17 was nonreactive with his lipid profile showing good cholesterol at that time as well. All of his immunizations are currently up to date.    Noah Henderson has been taking the Symtuza as prescribed with no adverse side effects. He does continue to have balance disturbance at times that appears to be worsened in the heat. He currently drinks water, tea and soda. Balance disturbance can occur at any time with no specific pattern or trigger. This is not new since starting Symtuza. He has not missed any doses and has no problems obtaining or taking his medications. Denies fevers, chills, night sweats, headaches, changes in vision, neck pain/stiffness, nausea, diarrhea, vomiting, lesions or rashes.   Allergies  Allergen Reactions  . Penicillins     Outpatient Medications Prior to Visit  Medication Sig Dispense Refill  . amitriptyline (ELAVIL) 50 MG tablet TAKE 1 TABLET(50 MG) BY MOUTH AT BEDTIME 30 tablet 5  . atenolol (TENORMIN) 50 MG tablet TAKE 1 TABLET(50 MG) BY MOUTH DAILY 30 tablet 3  . Darunavir-Cobicisctat-Emtricitabine-Tenofovir Alafenamide (SYMTUZA) 800-150-200-10 MG TABS Take 1 tablet by mouth daily with breakfast. 30 tablet 11  . diphenoxylate-atropine (LOMOTIL) 2.5-0.025 MG tablet TAKE 1 TABLET BY MOUTH TWICE DAILY AS NEEDED FOR LOOSE STOOLS 30 tablet 5  . feeding supplement (ENSURE IMMUNE HEALTH) LIQD Take 237 mLs by mouth 3 (three) times daily with meals.  90 Bottle 11  . metoprolol succinate (TOPROL-XL) 100 MG 24 hr tablet TAKE 1 TABLET BY MOUTH EVERY DAY*TAKE WITH OR IMMEDIATELY FOLLOWING A MEAL 30 tablet 5  . omeprazole (PRILOSEC) 40 MG capsule TAKE 1 CAPSULE BY MOUTH DAILY 30 capsule 5  . Pancrelipase, Lip-Prot-Amyl, (CREON) 24000-76000 units CPEP Take 2 capsules (48,000 Units total) by mouth 3 (three) times daily. 180 capsule 5  . Pitavastatin Calcium 4 MG TABS Take 4 mg by mouth daily. Study provided, drug may be placebo    . promethazine (PHENERGAN) 25 MG tablet TAKE 1 TABLET BY MOUTH TWICE DAILY AS NEEDED FOR NAUSEA OR VOMITING 60 tablet 0  . clonazePAM (KLONOPIN) 2 MG tablet Take 1 tablet (2 mg total) by mouth 2 (two) times daily as needed. (Patient not taking: Reported on 04/21/2018) 60 tablet 0  . DESCOVY 200-25 MG tablet TAKE 1 TABLET BY MOUTH EVERY DAY (Patient not taking: Reported on 04/21/2018) 30 tablet 5  . PREZCOBIX 800-150 MG tablet TAKE 1 TABLET BY MOUTH DAILY (Patient not taking: Reported on 04/21/2018) 30 tablet 3   No facility-administered medications prior to visit.      Past Medical History:  Diagnosis Date  . AIDS (HCC)   . Anemia   . Anxiety   . Balance problems 03/26/2016  . Dementia   . Depression   . 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 HIV lipodystrophy (HCC)   . Hyperlipidemia   . 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 Muscle spasm 03/26/2016  . Pancreatitis   . Thrombocytopenia (HCC)   . Unintentional weight loss 06/17/2017     History reviewed. No pertinent surgical history.     Review of Systems  Constitutional: Negative for appetite change, chills, fatigue, fever and unexpected weight change.  Eyes: Negative for visual disturbance.  Respiratory: Negative for cough, chest tightness, shortness of breath and wheezing.   Cardiovascular: Negative for chest pain and leg swelling.  Gastrointestinal: Negative for abdominal  pain, constipation, diarrhea, nausea and vomiting.  Genitourinary: Negative for dysuria, flank pain, frequency, genital sores, hematuria and urgency.  Skin: Negative for rash.  Allergic/Immunologic: Negative for immunocompromised state.  Neurological: Positive for dizziness. Negative for weakness and headaches.      Objective:    BP 138/76   Pulse 76   Temp 97.9 F (36.6 C)   Wt 131 lb (59.4 kg)   BMI 19.92 kg/m  Nursing note and vital signs reviewed.  Physical Exam  Constitutional: He is oriented to person, place, and time. He appears well-developed. No distress.  Thin gentleman seated in the chair appears older than his known age; very pleasant.   HENT:  Right Ear: Hearing, tympanic membrane, external ear and ear canal normal.  Left Ear: Hearing, tympanic membrane, external ear and ear canal normal.  Mouth/Throat: Oropharynx is clear and moist.  Eyes: Conjunctivae are normal.  Neck: Neck supple.  Cardiovascular: Normal rate, regular rhythm, normal heart sounds and intact distal pulses. Exam reveals no gallop and no friction rub.  No murmur heard. Pulmonary/Chest: Effort normal and breath sounds normal. No respiratory distress. He has no wheezes. He has no rales. He exhibits no tenderness.  Abdominal: Soft. Bowel sounds are normal. There is no tenderness.  Lymphadenopathy:    He has no cervical adenopathy.  Neurological: He is alert and oriented to person, place, and time.  Skin: Skin is warm and dry. No rash noted.  Psychiatric: He has a normal mood and affect. His behavior is normal. Judgment and thought content normal.       Assessment & Plan:   Problem List Items Addressed This Visit      Other   Human immunodeficiency virus (HIV) disease (HCC) - Primary    Noah Henderson has well controlled HIV disease with a viral load that is undetectable with a CD4 count of 200. He is adherent with the medication and has no problems obtaining it. He has no symptoms of opportunistic  infection presently through history or physical exam. Immunizations are up to date per recommendations. Continue current dose of Symtuza. Follow-up office visit in 6 months or sooner if needed with blood work 1-2 weeks prior to appointment. Encouraged to obtain flu shot in September with nurse visit.      Relevant Orders   T-helper cell (CD4)- ([facility] clinic only)   HIV 1 RNA quant-no reflex-bld   CBC   Comprehensive metabolic panel   Lipid panel   RPR   Balance problems    Balance problems of undetermined source although from the history appears as though he may be dehydrated. Encouraged to increase fluid intake and decrease caffeine intake. If no improvement noted may benefit from a neurological or cardiovascular assessment.           I have discontinued Wendy Mikles. Pineo "Keith"'s PREZCOBIX, DESCOVY, clonazePAM, and promethazine. I am also having him maintain his feeding supplement, Pitavastatin Calcium, atenolol, Darunavir-Cobicisctat-Emtricitabine-Tenofovir Alafenamide, metoprolol succinate, amitriptyline, omeprazole, Pancrelipase (Lip-Prot-Amyl),  and diphenoxylate-atropine.   Follow-up: Return in about 6 months (around 10/22/2018), or if symptoms worsen or fail to improve.   Marcos EkeGreg Javoris Star, MSN, FNP-C Nurse Practitioner [facility] Office phone: [phone] Pager: [phone] [facility] Main number: [phone]

## 2018-04-21 NOTE — Assessment & Plan Note (Signed)
Balance problems of undetermined source although from the history appears as though he may be dehydrated. Encouraged to increase fluid intake and decrease caffeine intake. If no improvement noted may benefit from a neurological or cardiovascular assessment.

## 2018-06-23 ENCOUNTER — Other Ambulatory Visit: Payer: Self-pay | Admitting: Infectious Disease

## 2018-06-23 DIAGNOSIS — G4700 Insomnia, unspecified: Secondary | ICD-10-CM

## 2018-06-23 DIAGNOSIS — K219 Gastro-esophageal reflux disease without esophagitis: Secondary | ICD-10-CM

## 2018-07-06 ENCOUNTER — Encounter (INDEPENDENT_AMBULATORY_CARE_PROVIDER_SITE_OTHER): Payer: Medicare Other

## 2018-07-06 VITALS — BP 135/77 | HR 72 | Temp 98.0°F | Wt 137.0 lb

## 2018-07-06 DIAGNOSIS — Z006 Encounter for examination for normal comparison and control in clinical research program: Secondary | ICD-10-CM

## 2018-07-06 NOTE — Research (Signed)
Participant here for [phone] study visit Month 52. He denies any new medical concerns and has still been adhering to Symtuza and study IP.He will return on 1/30 for study visit after his visit with Dr. Daiva Eves.

## 2018-09-21 ENCOUNTER — Other Ambulatory Visit: Payer: Self-pay | Admitting: Infectious Disease

## 2018-09-21 ENCOUNTER — Other Ambulatory Visit: Payer: Self-pay

## 2018-09-21 DIAGNOSIS — K8689 Other specified diseases of pancreas: Secondary | ICD-10-CM

## 2018-09-21 DIAGNOSIS — B20 Human immunodeficiency virus [HIV] disease: Secondary | ICD-10-CM

## 2018-09-21 MED ORDER — DIPHENOXYLATE-ATROPINE 2.5-0.025 MG PO TABS: ORAL_TABLET | ORAL | 0 refills | Status: DC

## 2018-10-07 ENCOUNTER — Other Ambulatory Visit: Payer: Medicare Other

## 2018-10-07 DIAGNOSIS — B20 Human immunodeficiency virus [HIV] disease: Secondary | ICD-10-CM

## 2018-10-08 LAB — T-HELPER CELL (CD4) - (RCID CLINIC ONLY)
CD4 % Helper T Cell: 33 % (ref 33–55)
CD4 T Cell Abs: 290 /uL — ABNORMAL LOW (ref 400–2700)

## 2018-10-09 LAB — COMPREHENSIVE METABOLIC PANEL
AG Ratio: 1.7 (calc) (ref 1.0–2.5)
ALT: 15 U/L (ref 9–46)
AST: 19 U/L (ref 10–35)
Albumin: 4.2 g/dL (ref 3.6–5.1)
Alkaline phosphatase (APISO): 75 U/L (ref 40–115)
BUN: 16 mg/dL (ref 7–25)
CO2: 24 mmol/L (ref 20–32)
Calcium: 9.1 mg/dL (ref 8.6–10.3)
Chloride: 110 mmol/L (ref 98–110)
Creat: 0.88 mg/dL (ref 0.70–1.25)
Globulin: 2.5 g/dL (calc) (ref 1.9–3.7)
Glucose, Bld: 98 mg/dL (ref 65–99)
Potassium: 3.9 mmol/L (ref 3.5–5.3)
Sodium: 142 mmol/L (ref 135–146)
Total Bilirubin: 0.8 mg/dL (ref 0.2–1.2)
Total Protein: 6.7 g/dL (ref 6.1–8.1)

## 2018-10-09 LAB — LIPID PANEL
CHOLESTEROL: 153 mg/dL (ref <200)
HDL: 47 mg/dL (ref >40)
LDL Cholesterol (Calc): 86 mg/dL (calc)
Non-HDL Cholesterol (Calc): 106 mg/dL (calc) (ref <130)
Total CHOL/HDL Ratio: 3.3 (calc) (ref <5.0)
Triglycerides: 104 mg/dL (ref <150)

## 2018-10-09 LAB — RPR: RPR Ser Ql: NONREACTIVE

## 2018-10-09 LAB — CBC
HCT: 34.9 % — ABNORMAL LOW (ref 38.5–50.0)
Hemoglobin: 12.1 g/dL — ABNORMAL LOW (ref 13.2–17.1)
MCH: 28 pg (ref 27.0–33.0)
MCHC: 34.7 g/dL (ref 32.0–36.0)
MCV: 80.8 fL (ref 80.0–100.0)
MPV: 11.8 fL (ref 7.5–12.5)
Platelets: 58 10*3/uL — ABNORMAL LOW (ref 140–400)
RBC: 4.32 10*6/uL (ref 4.20–5.80)
RDW: 16.2 % — ABNORMAL HIGH (ref 11.0–15.0)
WBC: 4.1 10*3/uL (ref 3.8–10.8)

## 2018-10-09 LAB — HIV-1 RNA QUANT-NO REFLEX-BLD
HIV 1 RNA Quant: <20 copies/mL — AB
HIV-1 RNA Quant, Log: <1.3 Log copies/mL — AB

## 2018-10-21 ENCOUNTER — Encounter: Payer: Self-pay | Admitting: Infectious Disease

## 2018-10-21 ENCOUNTER — Encounter (INDEPENDENT_AMBULATORY_CARE_PROVIDER_SITE_OTHER): Payer: Medicare Other | Admitting: *Deleted

## 2018-10-21 ENCOUNTER — Ambulatory Visit (INDEPENDENT_AMBULATORY_CARE_PROVIDER_SITE_OTHER): Payer: Medicare Other | Admitting: Infectious Disease

## 2018-10-21 VITALS — BP 125/75 | HR 80 | Temp 98.1°F | Wt 138.0 lb

## 2018-10-21 DIAGNOSIS — D696 Thrombocytopenia, unspecified: Secondary | ICD-10-CM

## 2018-10-21 DIAGNOSIS — E291 Testicular hypofunction: Secondary | ICD-10-CM

## 2018-10-21 DIAGNOSIS — Z006 Encounter for examination for normal comparison and control in clinical research program: Secondary | ICD-10-CM

## 2018-10-21 DIAGNOSIS — E881 Lipodystrophy, not elsewhere classified: Secondary | ICD-10-CM

## 2018-10-21 DIAGNOSIS — K8689 Other specified diseases of pancreas: Secondary | ICD-10-CM

## 2018-10-21 DIAGNOSIS — R5383 Other fatigue: Secondary | ICD-10-CM

## 2018-10-21 DIAGNOSIS — Z1211 Encounter for screening for malignant neoplasm of colon: Secondary | ICD-10-CM

## 2018-10-21 DIAGNOSIS — B20 Human immunodeficiency virus [HIV] disease: Secondary | ICD-10-CM

## 2018-10-21 DIAGNOSIS — N529 Male erectile dysfunction, unspecified: Secondary | ICD-10-CM

## 2018-10-21 DIAGNOSIS — R634 Abnormal weight loss: Secondary | ICD-10-CM

## 2018-10-21 MED ORDER — SILDENAFIL CITRATE 25 MG PO TABS: 25.0000 mg | ORAL_TABLET | Freq: Every day | ORAL | 4 refills | Status: DC | PRN

## 2018-10-21 NOTE — Progress Notes (Signed)
Chief complaint: Problems with erectile dysfunction Subjective:    Patient ID: Noah Henderson, male    DOB: [birth year], 68 y.o.   MRN: [id number]  HPI  68  year old Caucasian man with HIV, previously well controlled on viramune XR and truvada with breakthrough viremia found to have a K103 mutation changed to Prezcobix and truvada--> Prezcobix and Descovy + MVC vs DTG with good virological suppression and stable CD4 count.  He is now only on Symtuza  He is currently enrolled into REPRIEVE but completed the A5354 study.  Noah Henderson had lost a fair amount of weight but was refusing much in the way of work-up.  He is regained some of that weight back since I last saw him.  We reviewed testing and things that he could do and I noted that he was also due for a colonoscopy which I have strongly encouraged him to do.  I have put an order in for it as well.  He did tell me that he did not like taking the prep that it made him feel poorly and I told him that certainly taking the prep is not the most pleasant part of getting an endoscopy but it is critical to clear the bowel so the GI doc can take a good look at the gastrointestinal lumen.  He is also having trouble with erectile dysfunction asked about this and I offered to check his testosterone and thyroid function and to prescribe him low-dose Viagra in the context of him being on a cytochrome P4 50 inhibiting drug.    Past Medical History:  Diagnosis Date  . AIDS (HCC)   . Anemia   . Anxiety   . Balance problems 03/26/2016  . Dementia (HCC)   . Depression   . 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 HIV lipodystrophy (HCC)   . Hyperlipidemia   . 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 Muscle spasm 03/26/2016  . Pancreatitis   . Thrombocytopenia (HCC)   . Unintentional weight loss 06/17/2017    No past surgical history on file.  Family History  Problem Relation Age  of Onset  . Heart disease Mother   . Heart disease Father   . Mental illness Father   . Heart disease Brother       Social History   Socioeconomic History  . Marital status: Single    Spouse name: Not on file  . Number of children: Not on file  . Years of education: Not on file  . Highest education level: Not on file  Occupational History  . Not on file  Social Needs  . Financial resource strain: Not on file  . Food insecurity:    Worry: Not on file    Inability: Not on file  . Transportation needs:    Medical: Not on file    Non-medical: Not on file  Tobacco Use  . Smoking status: Never Smoker  . Smokeless tobacco: Never Used  Substance and Sexual Activity  . Alcohol use: No    Alcohol/week: 0.0 standard drinks  . Drug use: No  . Sexual activity: Yes    Partners: Male    Comment: pt. given condoms  Lifestyle  . Physical activity:    Days per week: Not on file    Minutes per session: Not on file  . Stress: Not on file  Relationships  . Social connections:    Talks on phone: Not on file  Gets together: Not on file    Attends religious service: Not on file    Active member of club or organization: Not on file    Attends meetings of clubs or organizations: Not on file    Relationship status: Not on file  Other Topics Concern  . Not on file  Social History Narrative  . Not on file    Allergies  Allergen Reactions  . Penicillins      Current Outpatient Medications:  .  amitriptyline (ELAVIL) 50 MG tablet, TAKE 1 TABLET(50 MG) BY MOUTH AT BEDTIME, Disp: 30 tablet, Rfl: 3 .  atenolol (TENORMIN) 50 MG tablet, TAKE 1 TABLET(50 MG) BY MOUTH DAILY, Disp: 30 tablet, Rfl: 3 .  CREON 24000-76000 units CPEP, TAKE 2 CAPSULES BY MOUTH THREE TIMES DAILY, Disp: 180 capsule, Rfl: 0 .  Darunavir-Cobicisctat-Emtricitabine-Tenofovir Alafenamide (SYMTUZA) 800-150-200-10 MG TABS, Take 1 tablet by mouth daily with breakfast., Disp: 30 tablet, Rfl: 11 .   diphenoxylate-atropine (LOMOTIL) 2.5-0.025 MG tablet, TAKE 1 TABLET BY MOUTH TWICE DAILY AS NEEDED FOR LOOSE STOOL, Disp: 30 tablet, Rfl: 0 .  feeding supplement (ENSURE IMMUNE HEALTH) LIQD, Take 237 mLs by mouth 3 (three) times daily with meals., Disp: 90 Bottle, Rfl: 11 .  metoprolol succinate (TOPROL-XL) 100 MG 24 hr tablet, TAKE 1 TABLET BY MOUTH EVERY DAY*TAKE WITH OR IMMEDIATELY FOLLOWING A MEAL, Disp: 30 tablet, Rfl: 3 .  omeprazole (PRILOSEC) 40 MG capsule, TAKE 1 CAPSULE BY MOUTH DAILY, Disp: 30 capsule, Rfl: 3 .  Pitavastatin Calcium 4 MG TABS, Take 4 mg by mouth daily. Study provided, drug may be placebo, Disp: , Rfl:    Review of Systems  Constitutional: Negative for activity change, appetite change, chills, diaphoresis, fatigue and fever.  HENT: Negative for congestion, ear pain, rhinorrhea, sinus pressure, sneezing, sore throat and trouble swallowing.   Eyes: Negative for photophobia and visual disturbance.  Respiratory: Negative for cough, chest tightness, shortness of breath, wheezing and stridor.   Cardiovascular: Negative for chest pain, palpitations and leg swelling.  Gastrointestinal: Negative for abdominal distention, abdominal pain, anal bleeding, blood in stool, constipation, diarrhea, nausea and vomiting.  Genitourinary: Negative for difficulty urinating, dysuria, flank pain and hematuria.  Musculoskeletal: Negative for arthralgias, back pain, gait problem, joint swelling, myalgias, neck pain and neck stiffness.  Skin: Negative for color change, pallor, rash and wound.  Neurological: Negative for dizziness, tremors, weakness and light-headedness.  Hematological: Negative for adenopathy. Does not bruise/bleed easily.  Psychiatric/Behavioral: Positive for decreased concentration. Negative for agitation, behavioral problems, confusion and dysphoric mood.       Objective:   Physical Exam  Constitutional: He is oriented to person, place, and time. No distress.  HENT:   Head: Normocephalic and atraumatic.  Right Ear: Hearing, tympanic membrane, external ear and ear canal normal.  Left Ear: Hearing normal.  Mouth/Throat: Oropharynx is clear and moist. No oropharyngeal exudate.  Eyes: Conjunctivae and EOM are normal. No scleral icterus.  Neck: Normal range of motion. Neck supple. No JVD present.  Cardiovascular: Normal rate and regular rhythm.  Pulmonary/Chest: Effort normal and breath sounds normal. No respiratory distress.  Abdominal: Soft. He exhibits no distension.  Musculoskeletal:        General: No tenderness or edema.  Lymphadenopathy:    He has no cervical adenopathy.  Neurological: He is alert and oriented to person, place, and time. He exhibits normal muscle tone. Coordination normal.  Skin: Skin is warm and dry. He is not diaphoretic. No erythema. No pallor.  Psychiatric:  He has a normal mood and affect. His behavior is normal. Judgment and thought content normal. His speech is delayed.  Nursing note and vitals reviewed.         Assessment & Plan:   # 1 HIV: Continue Symtuza . 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  #8 thrombocytopenia: This is been chronic he asked about why the cause of this is I suspect it may be related either to his liver or to the severity of his original HIV infection with marrow suppression and potential damage: I am offering to get an  ultrasound of his liver which has not been done recently  I spent greater than 25 minutes with the patient including greater than 50% of time in face to face counsel of the patient his HIV reviewing his labs going over the work-up for weight loss as well as problems with erectile dysfunction and thrombocytopenia and in coordination of his care.

## 2018-10-21 NOTE — Research (Signed)
Noah Henderson was seen today for the Reprieve study. No new complaints voiced. Will be returning in June for the next visit.

## 2018-10-22 ENCOUNTER — Other Ambulatory Visit: Payer: Self-pay | Admitting: Infectious Disease

## 2018-10-22 DIAGNOSIS — K8689 Other specified diseases of pancreas: Secondary | ICD-10-CM

## 2018-10-22 DIAGNOSIS — B20 Human immunodeficiency virus [HIV] disease: Secondary | ICD-10-CM

## 2018-10-22 DIAGNOSIS — K219 Gastro-esophageal reflux disease without esophagitis: Secondary | ICD-10-CM

## 2018-10-22 DIAGNOSIS — G4700 Insomnia, unspecified: Secondary | ICD-10-CM

## 2018-10-22 LAB — TSH: TSH: 2.11 m[IU]/L (ref 0.40–4.50)

## 2018-10-22 LAB — PSA: PSA: 0.5 ng/mL (ref <=4.0)

## 2018-10-22 LAB — LUTEINIZING HORMONE: LH: 3.8 m[IU]/mL (ref 1.6–15.2)

## 2018-10-22 LAB — FOLLICLE STIMULATING HORMONE: FSH: 6.1 m[IU]/mL (ref 1.6–8.0)

## 2018-10-22 LAB — TESTOSTERONE: Testosterone: 405 ng/dL (ref 250–827)

## 2018-10-25 ENCOUNTER — Other Ambulatory Visit: Payer: Self-pay

## 2018-10-25 DIAGNOSIS — B20 Human immunodeficiency virus [HIV] disease: Secondary | ICD-10-CM

## 2018-10-26 ENCOUNTER — Ambulatory Visit
Admission: RE | Admit: 2018-10-26 | Discharge: 2018-10-26 | Disposition: A | Discharge status: Home / Self Care | Payer: Medicare Other | Source: Ambulatory Visit | Attending: Infectious Disease | Admitting: Infectious Disease

## 2018-10-26 DIAGNOSIS — R634 Abnormal weight loss: Secondary | ICD-10-CM

## 2018-10-26 DIAGNOSIS — K8689 Other specified diseases of pancreas: Secondary | ICD-10-CM

## 2018-10-26 DIAGNOSIS — D696 Thrombocytopenia, unspecified: Secondary | ICD-10-CM

## 2018-10-28 MED ORDER — DIPHENOXYLATE-ATROPINE 2.5-0.025 MG PO TABS: ORAL_TABLET | ORAL | 0 refills | Status: DC

## 2018-10-28 NOTE — Progress Notes (Signed)
RX. Called in pharmacy. 10/28/18

## 2018-11-01 ENCOUNTER — Encounter: Payer: Self-pay | Admitting: Infectious Disease

## 2018-11-18 ENCOUNTER — Other Ambulatory Visit: Payer: Self-pay | Admitting: Infectious Disease

## 2018-11-18 DIAGNOSIS — B20 Human immunodeficiency virus [HIV] disease: Secondary | ICD-10-CM

## 2018-11-23 ENCOUNTER — Other Ambulatory Visit: Payer: Self-pay | Admitting: Infectious Disease

## 2018-11-23 DIAGNOSIS — K8689 Other specified diseases of pancreas: Secondary | ICD-10-CM

## 2018-12-27 ENCOUNTER — Other Ambulatory Visit: Payer: Self-pay | Admitting: Infectious Disease

## 2018-12-27 ENCOUNTER — Telehealth: Payer: Self-pay

## 2018-12-27 DIAGNOSIS — B20 Human immunodeficiency virus [HIV] disease: Secondary | ICD-10-CM

## 2018-12-27 MED ORDER — DIPHENOXYLATE-ATROPINE 2.5-0.025 MG PO TABS: ORAL_TABLET | ORAL | 0 refills | Status: DC

## 2018-12-27 NOTE — Telephone Encounter (Signed)
Called in patient's prescription for Lomitil. Pharmacist was able to take prescription over phone.  Noah Henderson, [place]

## 2019-01-13 ENCOUNTER — Encounter: Payer: Self-pay | Admitting: Infectious Disease

## 2019-01-19 ENCOUNTER — Other Ambulatory Visit: Payer: Self-pay | Admitting: Infectious Disease

## 2019-01-19 DIAGNOSIS — B20 Human immunodeficiency virus [HIV] disease: Secondary | ICD-10-CM

## 2019-01-20 ENCOUNTER — Other Ambulatory Visit: Payer: Self-pay

## 2019-01-20 DIAGNOSIS — B20 Human immunodeficiency virus [HIV] disease: Secondary | ICD-10-CM

## 2019-01-20 MED ORDER — DIPHENOXYLATE-ATROPINE 2.5-0.025 MG PO TABS: ORAL_TABLET | ORAL | 0 refills | Status: DC

## 2019-02-18 ENCOUNTER — Other Ambulatory Visit: Payer: Self-pay | Admitting: Infectious Disease

## 2019-02-18 DIAGNOSIS — B20 Human immunodeficiency virus [HIV] disease: Secondary | ICD-10-CM

## 2019-02-18 DIAGNOSIS — G4700 Insomnia, unspecified: Secondary | ICD-10-CM

## 2019-02-18 DIAGNOSIS — I10 Essential (primary) hypertension: Secondary | ICD-10-CM

## 2019-02-18 DIAGNOSIS — K8689 Other specified diseases of pancreas: Secondary | ICD-10-CM

## 2019-02-18 DIAGNOSIS — K219 Gastro-esophageal reflux disease without esophagitis: Secondary | ICD-10-CM

## 2019-03-21 ENCOUNTER — Other Ambulatory Visit: Payer: Self-pay

## 2019-03-21 ENCOUNTER — Ambulatory Visit (INDEPENDENT_AMBULATORY_CARE_PROVIDER_SITE_OTHER): Payer: Medicare Other | Admitting: Infectious Disease

## 2019-03-21 ENCOUNTER — Encounter: Payer: Medicare Other | Admitting: *Deleted

## 2019-03-21 DIAGNOSIS — Z1211 Encounter for screening for malignant neoplasm of colon: Secondary | ICD-10-CM

## 2019-03-21 DIAGNOSIS — B20 Human immunodeficiency virus [HIV] disease: Secondary | ICD-10-CM

## 2019-03-21 DIAGNOSIS — I10 Essential (primary) hypertension: Secondary | ICD-10-CM | POA: Diagnosis not present

## 2019-03-21 DIAGNOSIS — K8689 Other specified diseases of pancreas: Secondary | ICD-10-CM

## 2019-03-21 DIAGNOSIS — F4323 Adjustment disorder with mixed anxiety and depressed mood: Secondary | ICD-10-CM

## 2019-03-21 DIAGNOSIS — D696 Thrombocytopenia, unspecified: Secondary | ICD-10-CM

## 2019-03-21 DIAGNOSIS — N529 Male erectile dysfunction, unspecified: Secondary | ICD-10-CM

## 2019-03-21 DIAGNOSIS — E881 Lipodystrophy, not elsewhere classified: Secondary | ICD-10-CM

## 2019-03-21 DIAGNOSIS — G969 Disorder of central nervous system, unspecified: Secondary | ICD-10-CM

## 2019-03-21 DIAGNOSIS — R634 Abnormal weight loss: Secondary | ICD-10-CM

## 2019-03-21 DIAGNOSIS — R5383 Other fatigue: Secondary | ICD-10-CM

## 2019-03-21 DIAGNOSIS — E291 Testicular hypofunction: Secondary | ICD-10-CM

## 2019-03-21 DIAGNOSIS — Z23 Encounter for immunization: Secondary | ICD-10-CM

## 2019-03-21 DIAGNOSIS — R64 Cachexia: Secondary | ICD-10-CM
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
## 2019-03-21 NOTE — Research (Signed)
Noah Henderson was here today for month 60 for Reprieve, A Randomized Trial to Prevent Vascular Events in HIV (study drug is Pitavastatin 4mg  or placebo)  He denies any new problems or medications and has stayed well over the past few months. He denies any problems taking his medications. He did see Dr. Tommy Medal today and had his clinic labs drawn. He will be returning in October tfor the next visit.

## 2019-03-21 NOTE — Progress Notes (Signed)
Verbal order per Dr. Tommy Medal to administer pneumovax 23. Vaccine administered and patient tolerated well.  Eugenia Mcalpine, LPN

## 2019-03-22 LAB — T-HELPER CELL (CD4) - (RCID CLINIC ONLY)
CD4 % Helper T Cell: 33 % (ref 33–65)
CD4 T Cell Abs: 247 /uL — ABNORMAL LOW (ref 400–1790)

## 2019-03-29 LAB — RPR: RPR Ser Ql: NONREACTIVE

## 2019-03-29 LAB — COMPLETE METABOLIC PANEL WITH GFR
AG Ratio: 1.8 (calc) (ref 1.0–2.5)
ALT: 12 U/L (ref 9–46)
AST: 21 U/L (ref 10–35)
Albumin: 4.6 g/dL (ref 3.6–5.1)
Alkaline phosphatase (APISO): 65 U/L (ref 35–144)
BUN: 14 mg/dL (ref 7–25)
CO2: 23 mmol/L (ref 20–32)
Calcium: 9.4 mg/dL (ref 8.6–10.3)
Chloride: 109 mmol/L (ref 98–110)
Creat: 0.94 mg/dL (ref 0.70–1.25)
GFR, Est African American: 98 mL/min/{1.73_m2} (ref >=60)
GFR, Est Non African American: 84 mL/min/{1.73_m2} (ref >=60)
Globulin: 2.6 g/dL (calc) (ref 1.9–3.7)
Glucose, Bld: 103 mg/dL — ABNORMAL HIGH (ref 65–99)
Potassium: 4.1 mmol/L (ref 3.5–5.3)
Sodium: 142 mmol/L (ref 135–146)
Total Bilirubin: 1 mg/dL (ref 0.2–1.2)
Total Protein: 7.2 g/dL (ref 6.1–8.1)

## 2019-03-29 LAB — CBC WITH DIFFERENTIAL/PLATELET
Absolute Monocytes: 252 cells/uL (ref 200–950)
Basophils Absolute: 21 cells/uL (ref 0–200)
Basophils Relative: 0.5 %
Eosinophils Absolute: 71 cells/uL (ref 15–500)
Eosinophils Relative: 1.7 %
HCT: 35.9 % — ABNORMAL LOW (ref 38.5–50.0)
Hemoglobin: 12.5 g/dL — ABNORMAL LOW (ref 13.2–17.1)
Lymphs Abs: 840 cells/uL — ABNORMAL LOW (ref 850–3900)
MCH: 27.8 pg (ref 27.0–33.0)
MCHC: 34.8 g/dL (ref 32.0–36.0)
MCV: 79.8 fL — ABNORMAL LOW (ref 80.0–100.0)
MPV: 10.9 fL (ref 7.5–12.5)
Monocytes Relative: 6 %
Neutro Abs: 3016 cells/uL (ref 1500–7800)
Neutrophils Relative %: 71.8 %
Platelets: 66 10*3/uL — ABNORMAL LOW (ref 140–400)
RBC: 4.5 10*6/uL (ref 4.20–5.80)
RDW: 15.2 % — ABNORMAL HIGH (ref 11.0–15.0)
Total Lymphocyte: 20 %
WBC: 4.2 10*3/uL (ref 3.8–10.8)

## 2019-03-29 LAB — HIV-1 RNA QUANT-NO REFLEX-BLD
HIV 1 RNA Quant: <20 copies/mL
HIV-1 RNA Quant, Log: <1.3 Log copies/mL

## 2019-04-19 ENCOUNTER — Encounter: Payer: Self-pay | Admitting: Infectious Disease

## 2019-04-29 ENCOUNTER — Other Ambulatory Visit: Payer: Self-pay | Admitting: *Deleted

## 2019-04-29 DIAGNOSIS — B20 Human immunodeficiency virus [HIV] disease: Secondary | ICD-10-CM

## 2019-04-29 MED ORDER — DIPHENOXYLATE-ATROPINE 2.5-0.025 MG PO TABS: ORAL_TABLET | ORAL | 3 refills | Status: DC

## 2019-06-21 ENCOUNTER — Other Ambulatory Visit: Payer: Self-pay | Admitting: Infectious Disease

## 2019-06-21 DIAGNOSIS — G4700 Insomnia, unspecified: Secondary | ICD-10-CM

## 2019-06-21 DIAGNOSIS — K219 Gastro-esophageal reflux disease without esophagitis: Secondary | ICD-10-CM

## 2019-06-23 ENCOUNTER — Other Ambulatory Visit: Payer: Self-pay

## 2019-06-23 DIAGNOSIS — I10 Essential (primary) hypertension: Secondary | ICD-10-CM

## 2019-06-23 DIAGNOSIS — B20 Human immunodeficiency virus [HIV] disease: Secondary | ICD-10-CM

## 2019-06-23 DIAGNOSIS — K8689 Other specified diseases of pancreas: Secondary | ICD-10-CM

## 2019-06-23 MED ORDER — METOPROLOL SUCCINATE ER 100 MG PO TB24: ORAL_TABLET | ORAL | 3 refills | Status: DC

## 2019-06-23 MED ORDER — SYMTUZA 800-150-200-10 MG PO TABS: 1.0000 | ORAL_TABLET | Freq: Every day | ORAL | 3 refills | Status: DC

## 2019-06-23 MED ORDER — CREON 24000-76000 UNITS PO CPEP: 2.0000 | ORAL_CAPSULE | Freq: Three times a day (TID) | ORAL | 3 refills | Status: DC

## 2019-07-18 ENCOUNTER — Ambulatory Visit (INDEPENDENT_AMBULATORY_CARE_PROVIDER_SITE_OTHER): Payer: Medicare Other | Admitting: Infectious Disease

## 2019-07-18 ENCOUNTER — Encounter: Payer: Self-pay | Admitting: Infectious Disease

## 2019-07-18 ENCOUNTER — Other Ambulatory Visit: Payer: Self-pay

## 2019-07-18 ENCOUNTER — Other Ambulatory Visit (HOSPITAL_COMMUNITY)
Admission: RE | Admit: 2019-07-18 | Discharge: 2019-07-18 | Disposition: A | Discharge status: Home / Self Care | Payer: Medicare Other | Source: Ambulatory Visit | Attending: Infectious Disease | Admitting: Infectious Disease

## 2019-07-18 ENCOUNTER — Encounter: Payer: Medicare Other | Admitting: *Deleted

## 2019-07-18 VITALS — BP 153/79 | HR 62 | Temp 97.5°F | Wt 146.2 lb

## 2019-07-18 VITALS — BP 131/73 | HR 73 | Temp 98.0°F | Wt 146.1 lb

## 2019-07-18 DIAGNOSIS — K8689 Other specified diseases of pancreas: Secondary | ICD-10-CM

## 2019-07-18 DIAGNOSIS — B20 Human immunodeficiency virus [HIV] disease: Secondary | ICD-10-CM

## 2019-07-18 DIAGNOSIS — Z23 Encounter for immunization: Secondary | ICD-10-CM

## 2019-07-18 DIAGNOSIS — Z113 Encounter for screening for infections with a predominantly sexual mode of transmission: Secondary | ICD-10-CM | POA: Diagnosis present

## 2019-07-18 DIAGNOSIS — Z79899 Other long term (current) drug therapy: Secondary | ICD-10-CM

## 2019-07-18 DIAGNOSIS — G969 Disorder of central nervous system, unspecified: Secondary | ICD-10-CM | POA: Diagnosis present

## 2019-07-18 DIAGNOSIS — Z006 Encounter for examination for normal comparison and control in clinical research program: Secondary | ICD-10-CM
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
## 2019-07-18 NOTE — Research (Signed)
Noah Henderson is here for his month 24 Reprieve visit. No new complaints or medications. Denied any muscle aches or weakness. States that he has been adherent with his ARVs and study medication. He did see Dr. Tommy Medal today and had clinic labs done. He will return in February for his next study visit.

## 2019-07-19 LAB — T-HELPER CELL (CD4) - (RCID CLINIC ONLY)
CD4 % Helper T Cell: 35 % (ref 33–65)
CD4 T Cell Abs: 290 /uL — ABNORMAL LOW (ref 400–1790)

## 2019-07-20 LAB — URINE CYTOLOGY ANCILLARY ONLY
Chlamydia: NEGATIVE
Comment: NEGATIVE
Comment: NORMAL
Neisseria Gonorrhea: NEGATIVE

## 2019-07-21 LAB — CBC WITH DIFFERENTIAL/PLATELET
Absolute Monocytes: 316 cells/uL (ref 200–950)
Basophils Absolute: 28 cells/uL (ref 0–200)
Basophils Relative: 0.7 %
Eosinophils Absolute: 108 cells/uL (ref 15–500)
Eosinophils Relative: 2.7 %
HCT: 33.2 % — ABNORMAL LOW (ref 38.5–50.0)
Hemoglobin: 11.7 g/dL — ABNORMAL LOW (ref 13.2–17.1)
Lymphs Abs: 824 cells/uL — ABNORMAL LOW (ref 850–3900)
MCH: 28 pg (ref 27.0–33.0)
MCHC: 35.2 g/dL (ref 32.0–36.0)
MCV: 79.4 fL — ABNORMAL LOW (ref 80.0–100.0)
MPV: 10.9 fL (ref 7.5–12.5)
Monocytes Relative: 7.9 %
Neutro Abs: 2724 cells/uL (ref 1500–7800)
Neutrophils Relative %: 68.1 %
Platelets: 68 10*3/uL — ABNORMAL LOW (ref 140–400)
RBC: 4.18 10*6/uL — ABNORMAL LOW (ref 4.20–5.80)
RDW: 15 % (ref 11.0–15.0)
Total Lymphocyte: 20.6 %
WBC: 4 10*3/uL (ref 3.8–10.8)

## 2019-07-21 LAB — COMPLETE METABOLIC PANEL WITH GFR
AG Ratio: 1.9 (calc) (ref 1.0–2.5)
ALT: 9 U/L (ref 9–46)
AST: 17 U/L (ref 10–35)
Albumin: 4.2 g/dL (ref 3.6–5.1)
Alkaline phosphatase (APISO): 69 U/L (ref 35–144)
BUN: 15 mg/dL (ref 7–25)
CO2: 24 mmol/L (ref 20–32)
Calcium: 9.2 mg/dL (ref 8.6–10.3)
Chloride: 107 mmol/L (ref 98–110)
Creat: 1.06 mg/dL (ref 0.70–1.25)
GFR, Est African American: 84 mL/min/{1.73_m2} (ref >=60)
GFR, Est Non African American: 73 mL/min/{1.73_m2} (ref >=60)
Globulin: 2.2 g/dL (calc) (ref 1.9–3.7)
Glucose, Bld: 95 mg/dL (ref 65–99)
Potassium: 4.1 mmol/L (ref 3.5–5.3)
Sodium: 140 mmol/L (ref 135–146)
Total Bilirubin: 0.9 mg/dL (ref 0.2–1.2)
Total Protein: 6.4 g/dL (ref 6.1–8.1)

## 2019-07-21 LAB — LIPID PANEL
Cholesterol: 155 mg/dL (ref <200)
HDL: 40 mg/dL (ref >=40)
LDL Cholesterol (Calc): 88 mg/dL (calc)
Non-HDL Cholesterol (Calc): 115 mg/dL (calc) (ref <130)
Total CHOL/HDL Ratio: 3.9 (calc) (ref <5.0)
Triglycerides: 168 mg/dL — ABNORMAL HIGH (ref <150)

## 2019-07-21 LAB — HIV-1 RNA QUANT-NO REFLEX-BLD
HIV 1 RNA Quant: <20 copies/mL
HIV-1 RNA Quant, Log: <1.3 Log copies/mL

## 2019-07-21 LAB — RPR: RPR Ser Ql: NONREACTIVE

## 2019-08-30 ENCOUNTER — Other Ambulatory Visit: Payer: Self-pay | Admitting: *Deleted

## 2019-08-30 DIAGNOSIS — B20 Human immunodeficiency virus [HIV] disease: Secondary | ICD-10-CM

## 2019-08-30 MED ORDER — DIPHENOXYLATE-ATROPINE 2.5-0.025 MG PO TABS: ORAL_TABLET | ORAL | 3 refills | Status: DC

## 2019-10-10 ENCOUNTER — Encounter: Payer: Self-pay | Admitting: Infectious Disease

## 2019-10-25 ENCOUNTER — Other Ambulatory Visit: Payer: Self-pay | Admitting: Infectious Disease

## 2019-10-25 DIAGNOSIS — G4700 Insomnia, unspecified: Secondary | ICD-10-CM

## 2019-10-25 DIAGNOSIS — B20 Human immunodeficiency virus [HIV] disease: Secondary | ICD-10-CM

## 2019-10-25 DIAGNOSIS — K219 Gastro-esophageal reflux disease without esophagitis: Secondary | ICD-10-CM

## 2019-10-25 DIAGNOSIS — R5383 Other fatigue: Secondary | ICD-10-CM

## 2019-10-25 DIAGNOSIS — K8689 Other specified diseases of pancreas: Secondary | ICD-10-CM

## 2019-10-25 DIAGNOSIS — E291 Testicular hypofunction: Secondary | ICD-10-CM

## 2019-10-25 DIAGNOSIS — N529 Male erectile dysfunction, unspecified: Secondary | ICD-10-CM

## 2019-10-25 DIAGNOSIS — I10 Essential (primary) hypertension: Secondary | ICD-10-CM

## 2019-11-09 ENCOUNTER — Ambulatory Visit: Payer: Medicare Other | Admitting: Infectious Disease

## 2019-11-21 ENCOUNTER — Ambulatory Visit (INDEPENDENT_AMBULATORY_CARE_PROVIDER_SITE_OTHER): Payer: Medicare Other | Admitting: Infectious Disease

## 2019-11-21 ENCOUNTER — Other Ambulatory Visit: Payer: Self-pay

## 2019-11-21 ENCOUNTER — Other Ambulatory Visit (HOSPITAL_COMMUNITY)
Admission: RE | Admit: 2019-11-21 | Discharge: 2019-11-21 | Disposition: A | Discharge status: Home / Self Care | Payer: Medicare Other | Source: Ambulatory Visit | Attending: Infectious Disease | Admitting: Infectious Disease

## 2019-11-21 ENCOUNTER — Encounter (INDEPENDENT_AMBULATORY_CARE_PROVIDER_SITE_OTHER): Payer: Self-pay | Admitting: *Deleted

## 2019-11-21 ENCOUNTER — Encounter: Payer: Self-pay | Admitting: Infectious Disease

## 2019-11-21 VITALS — BP 157/75 | HR 68 | Temp 97.9°F | Wt 144.0 lb

## 2019-11-21 DIAGNOSIS — D696 Thrombocytopenia, unspecified: Secondary | ICD-10-CM

## 2019-11-21 DIAGNOSIS — B20 Human immunodeficiency virus [HIV] disease: Secondary | ICD-10-CM | POA: Insufficient documentation

## 2019-11-21 DIAGNOSIS — R7989 Other specified abnormal findings of blood chemistry: Secondary | ICD-10-CM

## 2019-11-21 DIAGNOSIS — G969 Disorder of central nervous system, unspecified: Secondary | ICD-10-CM | POA: Diagnosis present

## 2019-11-21 DIAGNOSIS — Z006 Encounter for examination for normal comparison and control in clinical research program: Secondary | ICD-10-CM

## 2019-11-21 DIAGNOSIS — K8689 Other specified diseases of pancreas: Secondary | ICD-10-CM

## 2019-11-21 NOTE — Research (Signed)
Noah Henderson is here for his month 25 Reprieve visit and office visit with Dr. Daiva Eves. No new complaints or medications. Verbalized excellent adherence with his ARV and study medications. Denied any muscle aches or weakness. He will return in June for his next study visit.
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
## 2019-11-22 LAB — URINE CYTOLOGY ANCILLARY ONLY
Chlamydia: NEGATIVE
Comment: NEGATIVE
Comment: NORMAL
Neisseria Gonorrhea: NEGATIVE

## 2019-11-22 LAB — T-HELPER CELL (CD4) - (RCID CLINIC ONLY)
CD4 % Helper T Cell: 37 % (ref 33–65)
CD4 T Cell Abs: 310 /uL — ABNORMAL LOW (ref 400–1790)

## 2019-11-24 LAB — TESTOSTERONE: Testosterone: 540 ng/dL (ref 250–827)

## 2019-11-24 LAB — COMPLETE METABOLIC PANEL WITH GFR
AG Ratio: 1.7 (calc) (ref 1.0–2.5)
ALT: 13 U/L (ref 9–46)
AST: 17 U/L (ref 10–35)
Albumin: 4.2 g/dL (ref 3.6–5.1)
Alkaline phosphatase (APISO): 69 U/L (ref 35–144)
BUN: 9 mg/dL (ref 7–25)
CO2: 24 mmol/L (ref 20–32)
Calcium: 8.8 mg/dL (ref 8.6–10.3)
Chloride: 109 mmol/L (ref 98–110)
Creat: 0.86 mg/dL (ref 0.70–1.25)
GFR, Est African American: 105 mL/min/{1.73_m2} (ref >=60)
GFR, Est Non African American: 90 mL/min/{1.73_m2} (ref >=60)
Globulin: 2.5 g/dL (calc) (ref 1.9–3.7)
Glucose, Bld: 99 mg/dL (ref 65–99)
Potassium: 3.6 mmol/L (ref 3.5–5.3)
Sodium: 140 mmol/L (ref 135–146)
Total Bilirubin: 0.9 mg/dL (ref 0.2–1.2)
Total Protein: 6.7 g/dL (ref 6.1–8.1)

## 2019-11-24 LAB — FSH/LH
FSH: 8.1 m[IU]/mL — ABNORMAL HIGH (ref 1.6–8.0)
LH: 4.6 m[IU]/mL (ref 1.6–15.2)

## 2019-11-24 LAB — PSA: PSA: 0.5 ng/mL (ref <=4.0)

## 2019-11-24 LAB — RPR: RPR Ser Ql: NONREACTIVE

## 2019-11-24 LAB — HIV-1 RNA QUANT-NO REFLEX-BLD
HIV 1 RNA Quant: <20 copies/mL
HIV-1 RNA Quant, Log: <1.3 Log copies/mL

## 2020-01-02 ENCOUNTER — Telehealth: Payer: Self-pay

## 2020-01-02 NOTE — Telephone Encounter (Signed)
Lomotil rx called in to [facility].   Jakhari Space Loyola Mast, RN

## 2020-02-29 ENCOUNTER — Other Ambulatory Visit: Payer: Self-pay | Admitting: Infectious Disease

## 2020-02-29 DIAGNOSIS — B20 Human immunodeficiency virus [HIV] disease: Secondary | ICD-10-CM

## 2020-02-29 DIAGNOSIS — K8689 Other specified diseases of pancreas: Secondary | ICD-10-CM

## 2020-02-29 DIAGNOSIS — G4700 Insomnia, unspecified: Secondary | ICD-10-CM

## 2020-02-29 DIAGNOSIS — I10 Essential (primary) hypertension: Secondary | ICD-10-CM

## 2020-02-29 DIAGNOSIS — K219 Gastro-esophageal reflux disease without esophagitis: Secondary | ICD-10-CM

## 2020-03-21 ENCOUNTER — Encounter (INDEPENDENT_AMBULATORY_CARE_PROVIDER_SITE_OTHER): Payer: Self-pay | Admitting: *Deleted

## 2020-03-21 ENCOUNTER — Other Ambulatory Visit: Payer: Self-pay

## 2020-03-21 VITALS — BP 136/78 | HR 70 | Temp 98.0°F | Wt 147.5 lb

## 2020-03-21 DIAGNOSIS — Z006 Encounter for examination for normal comparison and control in clinical research program: Secondary | ICD-10-CM

## 2020-03-21 NOTE — Research (Signed)
Noah Henderson was here for his month 72 visit for Reprieve, A Randomized Trial to Prevent Vascular Events in HIV (study drug is Pitavastatin 4mg  or placebo)  He denies any new problems or concerns. He did get his Covid vaccines ) in April and May from [place], but is unsure of the dates.  He is interested in the Hep B vaccine study and will screen at the next visit in October. His adherence to study meds. Is very good he says.

## 2020-04-30 ENCOUNTER — Telehealth: Payer: Self-pay | Admitting: *Deleted

## 2020-04-30 NOTE — Telephone Encounter (Signed)
Patient's handicapped placard signed by Dr Daiva Eves. Copy made for chart, original mailed back to patient in the self-addressed stamped envelope the patient provided. Andree Coss, RN

## 2020-05-10 ENCOUNTER — Other Ambulatory Visit: Payer: Self-pay

## 2020-05-10 DIAGNOSIS — B20 Human immunodeficiency virus [HIV] disease: Secondary | ICD-10-CM

## 2020-05-10 MED ORDER — DIPHENOXYLATE-ATROPINE 2.5-0.025 MG PO TABS: ORAL_TABLET | ORAL | 3 refills | Status: DC

## 2020-05-10 MED ORDER — DIPHENOXYLATE-ATROPINE 2.5-0.025 MG PO TABS: ORAL_TABLET | ORAL | 0 refills | Status: DC

## 2020-05-14 ENCOUNTER — Encounter: Payer: Self-pay | Admitting: Infectious Disease

## 2020-05-23 ENCOUNTER — Ambulatory Visit: Payer: Medicare Other

## 2020-05-23 ENCOUNTER — Ambulatory Visit: Payer: Medicare Other | Admitting: Infectious Disease

## 2020-06-04 ENCOUNTER — Other Ambulatory Visit: Payer: Self-pay | Admitting: Infectious Disease

## 2020-06-04 DIAGNOSIS — B20 Human immunodeficiency virus [HIV] disease: Secondary | ICD-10-CM

## 2020-06-04 DIAGNOSIS — G4700 Insomnia, unspecified: Secondary | ICD-10-CM

## 2020-06-04 DIAGNOSIS — I10 Essential (primary) hypertension: Secondary | ICD-10-CM

## 2020-06-04 DIAGNOSIS — K219 Gastro-esophageal reflux disease without esophagitis: Secondary | ICD-10-CM

## 2020-06-04 DIAGNOSIS — K8689 Other specified diseases of pancreas: Secondary | ICD-10-CM

## 2020-06-11 ENCOUNTER — Other Ambulatory Visit: Payer: Self-pay

## 2020-06-11 DIAGNOSIS — B20 Human immunodeficiency virus [HIV] disease: Secondary | ICD-10-CM

## 2020-06-11 MED ORDER — DIPHENOXYLATE-ATROPINE 2.5-0.025 MG PO TABS: ORAL_TABLET | ORAL | 2 refills | Status: DC

## 2020-06-12 ENCOUNTER — Other Ambulatory Visit: Payer: Self-pay

## 2020-06-12 DIAGNOSIS — B20 Human immunodeficiency virus [HIV] disease: Secondary | ICD-10-CM

## 2020-06-12 MED ORDER — DIPHENOXYLATE-ATROPINE 2.5-0.025 MG PO TABS: ORAL_TABLET | ORAL | 2 refills | Status: DC

## 2020-06-13 ENCOUNTER — Ambulatory Visit (INDEPENDENT_AMBULATORY_CARE_PROVIDER_SITE_OTHER): Payer: Medicare Other | Admitting: Infectious Disease

## 2020-06-13 ENCOUNTER — Encounter: Payer: Self-pay | Admitting: Infectious Disease

## 2020-06-13 ENCOUNTER — Other Ambulatory Visit (HOSPITAL_COMMUNITY)
Admission: RE | Admit: 2020-06-13 | Discharge: 2020-06-13 | Disposition: A | Discharge status: Home / Self Care | Payer: Medicare Other | Source: Ambulatory Visit | Attending: Infectious Disease | Admitting: Infectious Disease

## 2020-06-13 ENCOUNTER — Other Ambulatory Visit: Payer: Self-pay

## 2020-06-13 VITALS — BP 168/89 | HR 75 | Temp 98.2°F | Wt 144.0 lb

## 2020-06-13 DIAGNOSIS — I10 Essential (primary) hypertension: Secondary | ICD-10-CM | POA: Diagnosis not present

## 2020-06-13 DIAGNOSIS — Z7189 Other specified counseling: Secondary | ICD-10-CM

## 2020-06-13 DIAGNOSIS — B20 Human immunodeficiency virus [HIV] disease: Secondary | ICD-10-CM | POA: Diagnosis present

## 2020-06-13 DIAGNOSIS — Z23 Encounter for immunization: Secondary | ICD-10-CM

## 2020-06-13 DIAGNOSIS — G969 Disorder of central nervous system, unspecified: Secondary | ICD-10-CM

## 2020-06-13 DIAGNOSIS — K8689 Other specified diseases of pancreas: Secondary | ICD-10-CM | POA: Diagnosis not present

## 2020-06-13 DIAGNOSIS — D696 Thrombocytopenia, unspecified: Secondary | ICD-10-CM | POA: Diagnosis not present

## 2020-06-13 DIAGNOSIS — Z7185 Encounter for immunization safety counseling: Secondary | ICD-10-CM

## 2020-06-13 NOTE — Progress Notes (Signed)
Chief complaint: For HIV and pancreatic insufficiency, and hypertension he is desiring a third Covid Covid Moderna vaccine Subjective:    Patient ID: Noah Henderson, male    DOB: Jun 02, 1951, 69 y.o.   MRN: [id number]  HPI  69  year old Caucasian man with HIV, previously well controlled on viramune XR and truvada with breakthrough viremia found to have a K103 mutation changed to Prezcobix and truvada--> Prezcobix and Descovy + MVC vs DTG with good virological suppression and stable CD4 count.  He is now only on Symtuza  He is currently enrolled into [facility]  At his last visit in March she was still suppressed.  He had some concern about low testosterone but we checked the levels they were normal.  He is continue on Symtuza which he takes religiously.  His blood pressure was elevated today in clinic despite him being on metoprolol.  He attributes this to the stress of trying in the clinic with bad weather.  He does not have primary care at this point.       Past Medical History:  Diagnosis Date  . AIDS (HCC)   . Anemia   . Anxiety   . Balance problems 03/26/2016  . Dementia (HCC)   . Depression   . 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 HIV lipodystrophy (HCC)   . Hyperlipidemia   . 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 Muscle spasm 03/26/2016  . Pancreatitis   . Thrombocytopenia (HCC)   . Unintentional weight loss 06/17/2017    No past surgical history on file.  Family History  Problem Relation Age of Onset  . Heart disease Mother   . Heart disease Father   . Mental illness Father   . Heart disease Brother       Social History   Socioeconomic History  . Marital status: Single    Spouse name: Not on file  . Number of children: Not on file  . Years of education: Not on file  . Highest education level: Not on file  Occupational History  . Not on file  Tobacco Use  . Smoking  status: Never Smoker  . Smokeless tobacco: Never Used  Substance and Sexual Activity  . Alcohol use: No    Alcohol/week: 0.0 standard drinks  . Drug use: No  . Sexual activity: Yes    Partners: Male    Birth control/protection: Condom    Comment: pt. given condoms  Other Topics Concern  . Not on file  Social History Narrative  . Not on file   Social Determinants of Health   Financial Resource Strain:   . Difficulty of Paying Living Expenses: Not on file  Food Insecurity:   . Worried About Programme researcher, broadcasting/film/video in the Last Year: Not on file  . Ran Out of Food in the Last Year: Not on file  Transportation Needs:   . Lack of Transportation (Medical): Not on file  . Lack of Transportation (Non-Medical): Not on file  Physical Activity:   . Days of Exercise per Week: Not on file  . Minutes of Exercise per Session: Not on file  Stress:   . Feeling of Stress : Not on file  Social Connections:   . Frequency of Communication with Friends and Family: Not on file  . Frequency of Social Gatherings with Friends and Family: Not on file  . Attends Religious Services: Not on file  . Active Member of  Clubs or Organizations: Not on file  . Attends Banker Meetings: Not on file  . Marital Status: Not on file    Allergies  Allergen Reactions  . Penicillins      Current Outpatient Medications:  .  amitriptyline (ELAVIL) 50 MG tablet, TAKE ONE TABLET BY MOUTH EVERY NIGHT AT BEDTIME, Disp: 30 tablet, Rfl: 2 .  atenolol (TENORMIN) 50 MG tablet, TAKE 1 TABLET(50 MG) BY MOUTH DAILY, Disp: 30 tablet, Rfl: 3 .  CREON 24000-76000 units CPEP, TAKE 2 CAPSULES BY MOUTH THREE TIMES DAILY, Disp: 180 capsule, Rfl: 2 .  diphenoxylate-atropine (LOMOTIL) 2.5-0.025 MG tablet, Take 1 tablet by mouth twice daily as needed for loose bowel, Disp: 30 tablet, Rfl: 2 .  feeding supplement (ENSURE IMMUNE HEALTH) LIQD, Take 237 mLs by mouth 3 (three) times daily with meals., Disp: 90 Bottle, Rfl: 11 .   metoprolol succinate (TOPROL-XL) 100 MG 24 hr tablet, TAKE 1 TABLET BY MOUTH EVERY DAY WITH OR IMMEDIATELY FOLLOWING A MEAL, Disp: 30 tablet, Rfl: 2 .  omeprazole (PRILOSEC) 40 MG capsule, TAKE ONE CAPSULE BY MOUTH EVERY DAY, Disp: 30 capsule, Rfl: 2 .  Pitavastatin Calcium 4 MG TABS, Take 4 mg by mouth daily. Study provided, drug may be placebo, Disp: , Rfl:  .  sildenafil (VIAGRA) 25 MG tablet, TAKE 1 TABLET BY MOUTH ONCE DAILY AS NEEDED FOR ERECTILE DYSFUNCTION, Disp: 10 tablet, Rfl: 4 .  SYMTUZA 800-150-200-10 MG TABS, TAKE 1 TABLET BY MOUTH DAILY WITH BREAKFAST, Disp: 30 tablet, Rfl: 2   Review of Systems  Constitutional: Negative for activity change, appetite change, chills, diaphoresis and fever.  HENT: Negative for congestion, ear pain, rhinorrhea, sinus pressure, sneezing, sore throat and trouble swallowing.   Eyes: Negative for photophobia and visual disturbance.  Respiratory: Negative for cough, chest tightness, shortness of breath, wheezing and stridor.   Cardiovascular: Negative for chest pain, palpitations and leg swelling.  Gastrointestinal: Negative for abdominal distention, abdominal pain, anal bleeding, blood in stool, constipation, diarrhea, nausea and vomiting.  Genitourinary: Negative for difficulty urinating, dysuria, flank pain, frequency and hematuria.  Musculoskeletal: Negative for arthralgias, back pain, gait problem, joint swelling, myalgias, neck pain and neck stiffness.  Skin: Negative for color change, pallor, rash and wound.  Neurological: Negative for dizziness, tremors, weakness and light-headedness.  Hematological: Negative for adenopathy. Does not bruise/bleed easily.  Psychiatric/Behavioral: Negative for agitation, behavioral problems, confusion, dysphoric mood and hallucinations.       Objective:   Physical Exam Vitals and nursing note reviewed.  Constitutional:      General: He is not in acute distress.    Appearance: He is not diaphoretic.  HENT:      Head: Normocephalic and atraumatic.     Right Ear: Hearing, tympanic membrane, ear canal and external ear normal.     Left Ear: Hearing normal.     Mouth/Throat:     Pharynx: No oropharyngeal exudate.  Eyes:     General: No scleral icterus.    Conjunctiva/sclera: Conjunctivae normal.  Neck:     Vascular: No JVD.  Cardiovascular:     Rate and Rhythm: Normal rate and regular rhythm.  Pulmonary:     Effort: Pulmonary effort is normal. No respiratory distress.     Breath sounds: No wheezing.  Abdominal:     General: There is no distension.     Palpations: Abdomen is soft.  Musculoskeletal:        General: No tenderness or deformity.     Cervical  back: Normal range of motion and neck supple.  Lymphadenopathy:     Cervical: No cervical adenopathy.  Skin:    General: Skin is warm and dry.     Coloration: Skin is not pale.     Findings: No erythema.  Neurological:     Mental Status: He is alert and oriented to person, place, and time.     Motor: No abnormal muscle tone.     Coordination: Coordination normal.  Psychiatric:        Mood and Affect: Mood normal.        Speech: Speech is delayed.        Behavior: Behavior normal.        Thought Content: Thought content normal.        Judgment: Judgment normal.           Assessment & Plan:   # 1 HIV: Continue Symtuza, will check labs today.. 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  . #6 COVID prevention he desires a third  Moderna shot.  This is an off labe use of this vaccine.  In a bilevel's after vaccination with this particular COVID-19 vaccine tend to not wane as much as that of Pfizer which is why there is no booster indication yet.   Despite this I am completely supportive of him getting a third dose if he wishes to.

## 2020-06-14 LAB — URINE CYTOLOGY ANCILLARY ONLY
Chlamydia: NEGATIVE
Comment: NEGATIVE
Comment: NORMAL
Neisseria Gonorrhea: NEGATIVE

## 2020-06-14 LAB — T-HELPER CELL (CD4) - (RCID CLINIC ONLY)
CD4 % Helper T Cell: 38 % (ref 33–65)
CD4 T Cell Abs: 280 /uL — ABNORMAL LOW (ref 400–1790)

## 2020-06-17 LAB — COMPLETE METABOLIC PANEL WITH GFR
AG Ratio: 1.8 (calc) (ref 1.0–2.5)
ALT: 13 U/L (ref 9–46)
AST: 22 U/L (ref 10–35)
Albumin: 4.3 g/dL (ref 3.6–5.1)
Alkaline phosphatase (APISO): 70 U/L (ref 35–144)
BUN: 9 mg/dL (ref 7–25)
CO2: 23 mmol/L (ref 20–32)
Calcium: 9 mg/dL (ref 8.6–10.3)
Chloride: 108 mmol/L (ref 98–110)
Creat: 0.93 mg/dL (ref 0.70–1.25)
GFR, Est African American: 98 mL/min/{1.73_m2} (ref >=60)
GFR, Est Non African American: 85 mL/min/{1.73_m2} (ref >=60)
Globulin: 2.4 g/dL (calc) (ref 1.9–3.7)
Glucose, Bld: 83 mg/dL (ref 65–99)
Potassium: 3.6 mmol/L (ref 3.5–5.3)
Sodium: 139 mmol/L (ref 135–146)
Total Bilirubin: 1.4 mg/dL — ABNORMAL HIGH (ref 0.2–1.2)
Total Protein: 6.7 g/dL (ref 6.1–8.1)

## 2020-06-17 LAB — LIPID PANEL
Cholesterol: 161 mg/dL (ref <200)
HDL: 41 mg/dL (ref >=40)
LDL Cholesterol (Calc): 97 mg/dL (calc)
Non-HDL Cholesterol (Calc): 120 mg/dL (calc) (ref <130)
Total CHOL/HDL Ratio: 3.9 (calc) (ref <5.0)
Triglycerides: 131 mg/dL (ref <150)

## 2020-06-17 LAB — CBC WITH DIFFERENTIAL/PLATELET
Absolute Monocytes: 290 cells/uL (ref 200–950)
Basophils Absolute: 31 cells/uL (ref 0–200)
Basophils Relative: 0.7 %
Eosinophils Absolute: 92 cells/uL (ref 15–500)
Eosinophils Relative: 2.1 %
HCT: 37 % — ABNORMAL LOW (ref 38.5–50.0)
Hemoglobin: 13.4 g/dL (ref 13.2–17.1)
Lymphs Abs: 814 cells/uL — ABNORMAL LOW (ref 850–3900)
MCH: 30.8 pg (ref 27.0–33.0)
MCHC: 36.2 g/dL — ABNORMAL HIGH (ref 32.0–36.0)
MCV: 85.1 fL (ref 80.0–100.0)
MPV: 10.5 fL (ref 7.5–12.5)
Monocytes Relative: 6.6 %
Neutro Abs: 3172 cells/uL (ref 1500–7800)
Neutrophils Relative %: 72.1 %
Platelets: 71 10*3/uL — ABNORMAL LOW (ref 140–400)
RBC: 4.35 10*6/uL (ref 4.20–5.80)
RDW: 14.6 % (ref 11.0–15.0)
Total Lymphocyte: 18.5 %
WBC: 4.4 10*3/uL (ref 3.8–10.8)

## 2020-06-17 LAB — HIV-1 RNA QUANT-NO REFLEX-BLD
HIV 1 RNA Quant: <20 Copies/mL
HIV-1 RNA Quant, Log: <1.3 Log cps/mL

## 2020-06-17 LAB — RPR: RPR Ser Ql: NONREACTIVE

## 2020-06-18 DIAGNOSIS — R64 Cachexia: Secondary | ICD-10-CM

## 2020-06-19 NOTE — Telephone Encounter (Signed)
Sorry - Ensure is on his med list, but from 2013.

## 2020-06-20 MED ORDER — ENSURE PO LIQD: 237.0000 mL | Freq: Two times a day (BID) | ORAL | 5 refills | Status: DC

## 2020-06-20 NOTE — Addendum Note (Signed)
Addended by: Valarie Cones on: 06/20/2020 10:15 AM   Modules accepted: Orders

## 2020-07-02 ENCOUNTER — Other Ambulatory Visit: Payer: Self-pay | Admitting: Infectious Disease

## 2020-07-02 DIAGNOSIS — R64 Cachexia: Secondary | ICD-10-CM

## 2020-07-02 DIAGNOSIS — B20 Human immunodeficiency virus [HIV] disease: Secondary | ICD-10-CM

## 2020-07-02 MED ORDER — ENSURE PO LIQD: 1.0000 | Freq: Two times a day (BID) | ORAL | 5 refills | Status: AC

## 2020-07-04 ENCOUNTER — Encounter: Payer: Medicare Other | Admitting: *Deleted

## 2020-07-05 ENCOUNTER — Other Ambulatory Visit: Payer: Self-pay

## 2020-07-05 ENCOUNTER — Encounter (INDEPENDENT_AMBULATORY_CARE_PROVIDER_SITE_OTHER): Payer: Self-pay | Admitting: *Deleted

## 2020-07-05 VITALS — BP 146/80 | HR 90 | Temp 98.0°F | Ht 66.0 in | Wt 142.2 lb

## 2020-07-05 DIAGNOSIS — Z006 Encounter for examination for normal comparison and control in clinical research program: Secondary | ICD-10-CM

## 2020-07-05 NOTE — Research (Signed)
[name] was here for his month 61 Reprieve study visit and to screen for the [facility] study. He denies any new problems or medications, though he did get a flu vaccine last month. Informed consent was obtained after he took the consent home and reviewed it prior to this visit and we discussed details of the study and answered any questions he had regarding it before signing.  He was vaccinated for Hep B back in 2012 and 2013 and has not been tested for antibodies since then. He has chronic thrombocytopenia and pancreatic insufficiency which started around 2011. He does have hypertension, HIV wasting syndrome and peripheral neuropathy. He was originally diagnosed with HIV in 1995, but did not start treatment for it until 2000.  He recenly had labs drawn for the clinic and we will use his VL from that draw. Noah Henderson did his Physical exam at this visit. Entry planned for 11/11.

## 2020-07-06 LAB — COMPREHENSIVE METABOLIC PANEL
AG Ratio: 1.6 (calc) (ref 1.0–2.5)
ALT: 15 U/L (ref 9–46)
AST: 22 U/L (ref 10–35)
Albumin: 4.4 g/dL (ref 3.6–5.1)
Alkaline phosphatase (APISO): 75 U/L (ref 35–144)
BUN: 10 mg/dL (ref 7–25)
CO2: 26 mmol/L (ref 20–32)
Calcium: 9.3 mg/dL (ref 8.6–10.3)
Chloride: 106 mmol/L (ref 98–110)
Creat: 1.02 mg/dL (ref 0.70–1.25)
Globulin: 2.7 g/dL (calc) (ref 1.9–3.7)
Glucose, Bld: 105 mg/dL — ABNORMAL HIGH (ref 65–99)
Potassium: 3.6 mmol/L (ref 3.5–5.3)
Sodium: 141 mmol/L (ref 135–146)
Total Bilirubin: 1.1 mg/dL (ref 0.2–1.2)
Total Protein: 7.1 g/dL (ref 6.1–8.1)

## 2020-07-06 LAB — CD4/CD8 (T-HELPER/T-SUPPRESSOR CELL)
CD4 % Helper T Cell: 39.5
CD4 Count: 316
CD8 % Suppressor T Cell: 32.2
CD8 T Cell Abs: 258

## 2020-07-06 LAB — CBC AND DIFFERENTIAL
HCT: 40 — AB (ref 41–53)
Hemoglobin: 13.8 (ref 13.5–17.5)
Neutrophils Absolute: 2.9
Platelets: 69 — AB (ref 150–399)
WBC: 4.1

## 2020-07-06 LAB — HEPATITIS B SURFACE ANTIGEN: Hepatitis B Surface Ag: NONREACTIVE

## 2020-07-06 LAB — BILIRUBIN, DIRECT: Bilirubin, Direct: 0.2 mg/dL (ref 0.0–0.2)

## 2020-07-06 LAB — HEPATITIS B CORE ANTIBODY, TOTAL: Hep B Core Total Ab: REACTIVE — AB

## 2020-07-06 LAB — CBC: RBC: 4.51 (ref 3.87–5.11)

## 2020-07-06 LAB — CK: Total CK: 86 U/L (ref 44–196)

## 2020-07-06 LAB — PROTIME-INR
INR: 1.1
Prothrombin Time: 11.9 s — ABNORMAL HIGH (ref 9.0–11.5)

## 2020-07-06 LAB — HEMOGLOBIN A1C: Hemoglobin A1C: 4.8

## 2020-07-06 LAB — HEPATITIS B SURFACE ANTIBODY,QUALITATIVE: Hep B S Ab: NONREACTIVE

## 2020-07-06 LAB — PHOSPHORUS: Phosphorus: 3.1 mg/dL (ref 2.1–4.3)

## 2020-07-10 ENCOUNTER — Encounter: Payer: Self-pay | Admitting: Infectious Disease

## 2020-08-06 IMAGING — US US ABDOMEN LIMITED
1 series · 14 of 25 positions shown · non-contrast
Comparison: None.

CLINICAL DATA: Recent weight loss, possible cirrhosis

EXAM:
ULTRASOUND ABDOMEN LIMITED RIGHT UPPER QUADRANT

[Series 1: us abdomen limited · 0.20mm/px · 14 of 48 slices shown]
[im 1/48]
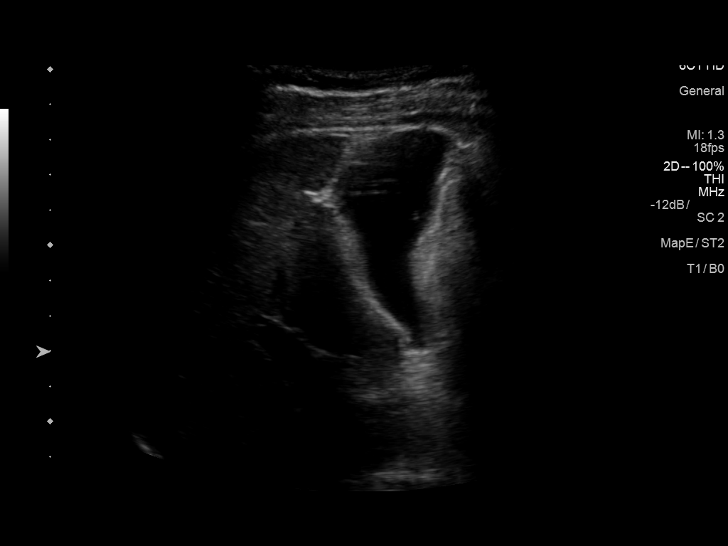
[im 4/48]
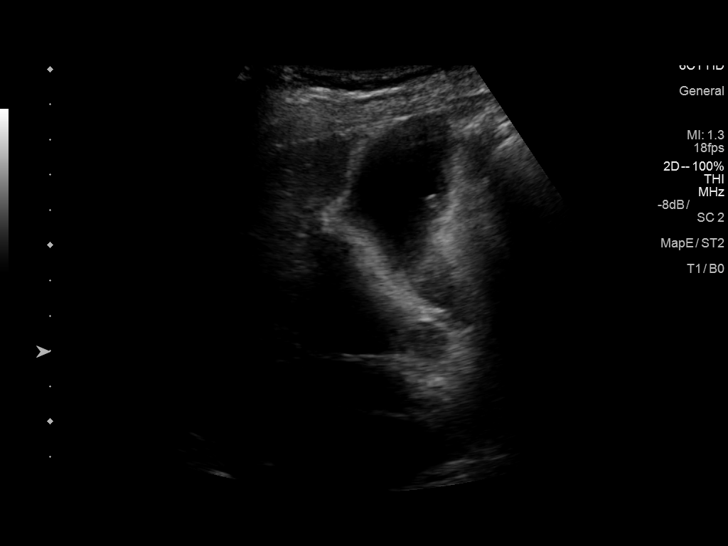
[im 8/48]
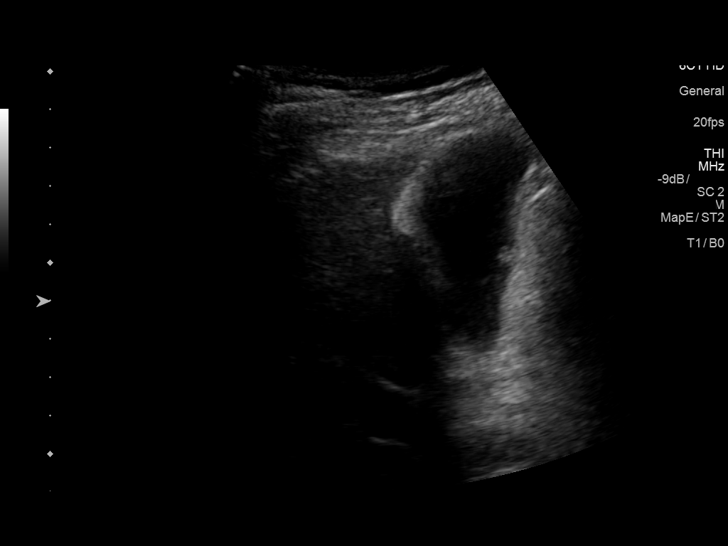
[im 12/48]
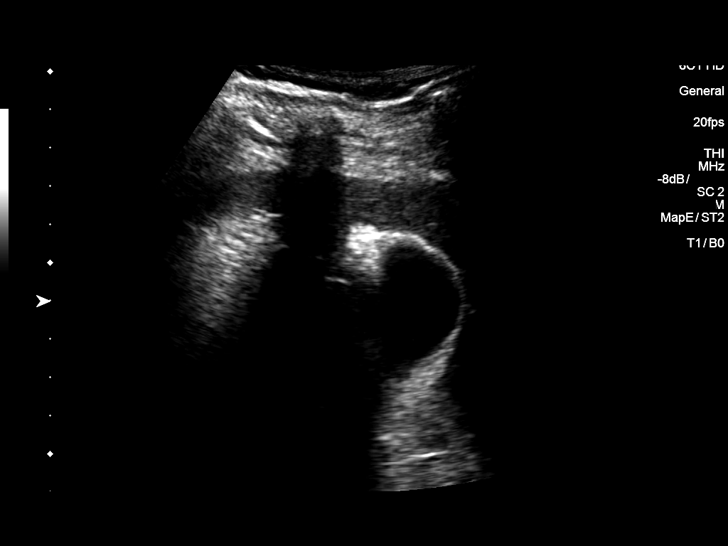
[im 16/48]
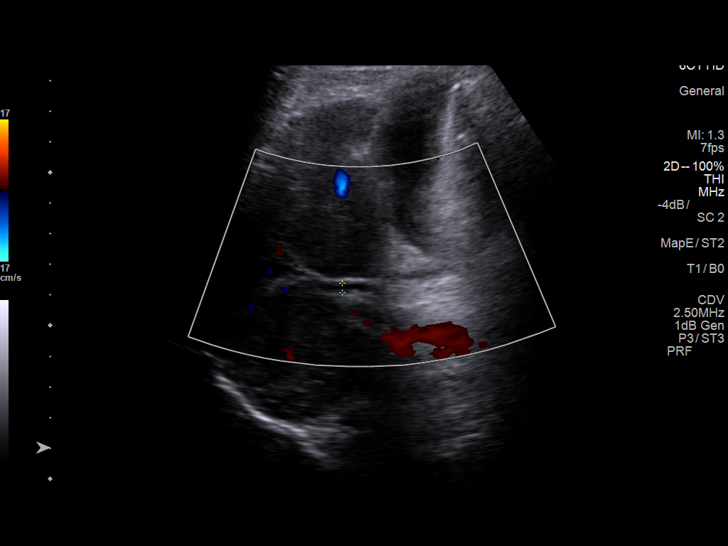
[im 18/48]
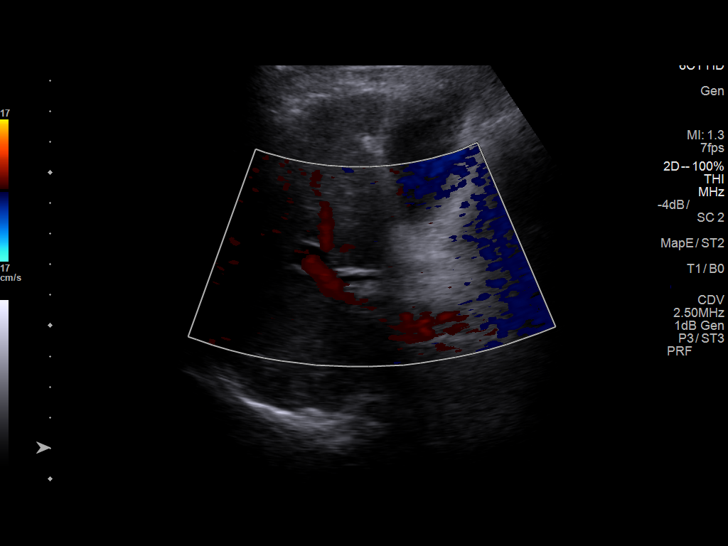
[im 22/48]
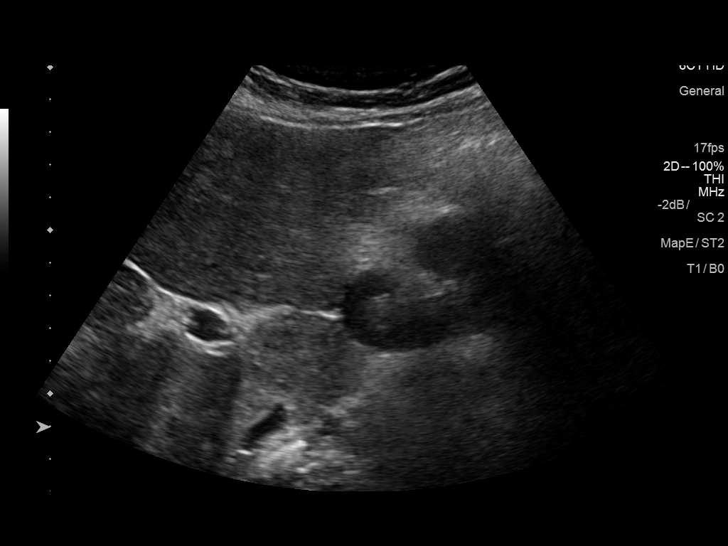
[im 26/48]
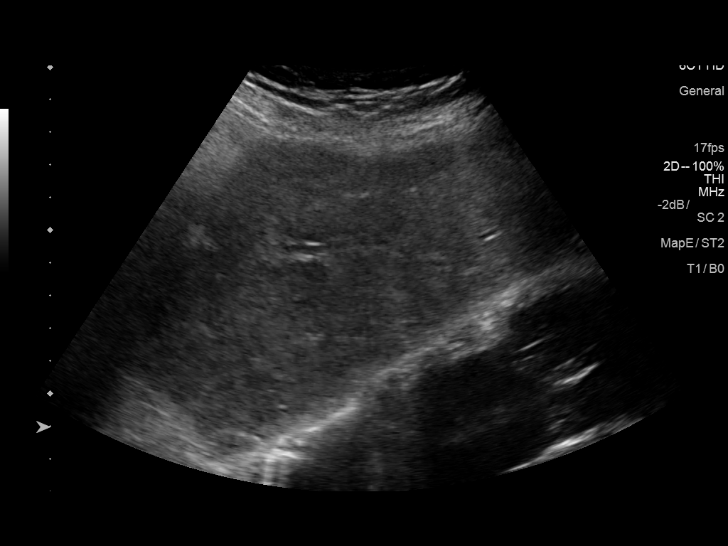
[im 30/48]
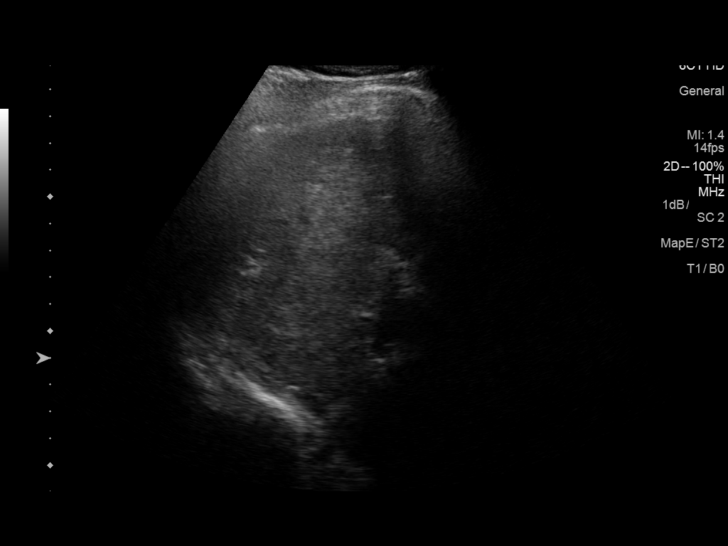
[im 32/48]
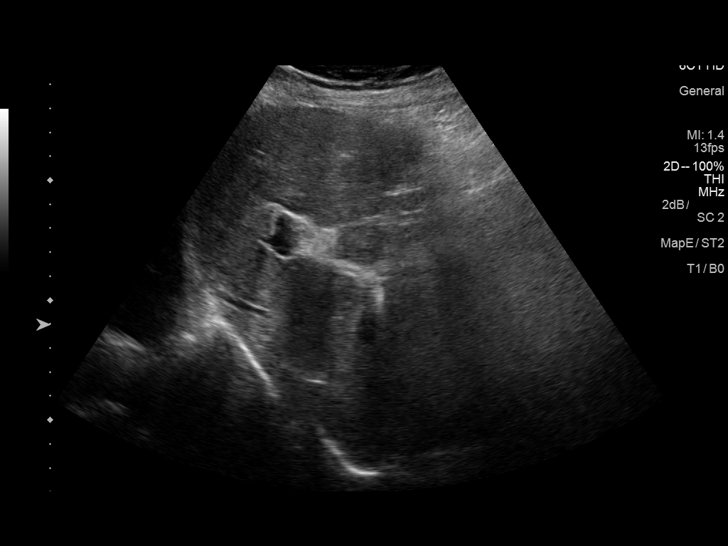
[im 36/48]
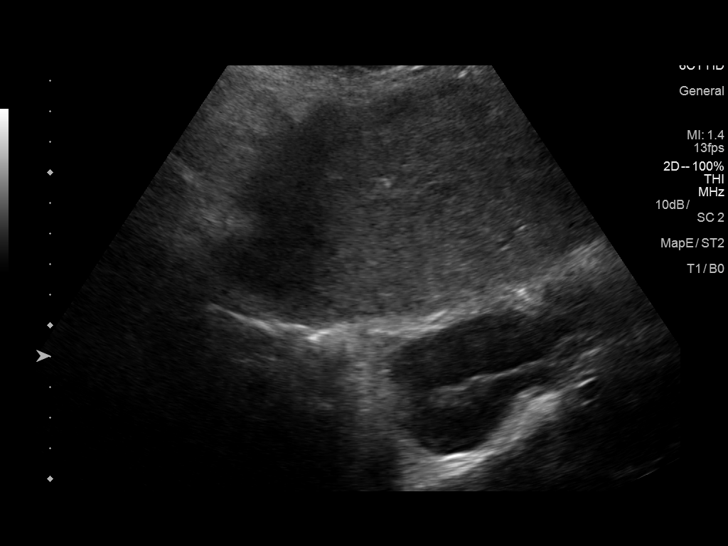
[im 40/48]
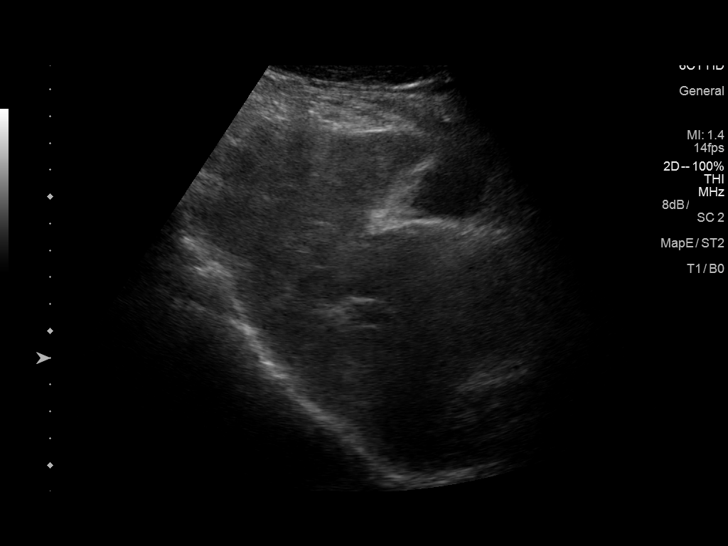
[im 44/48]
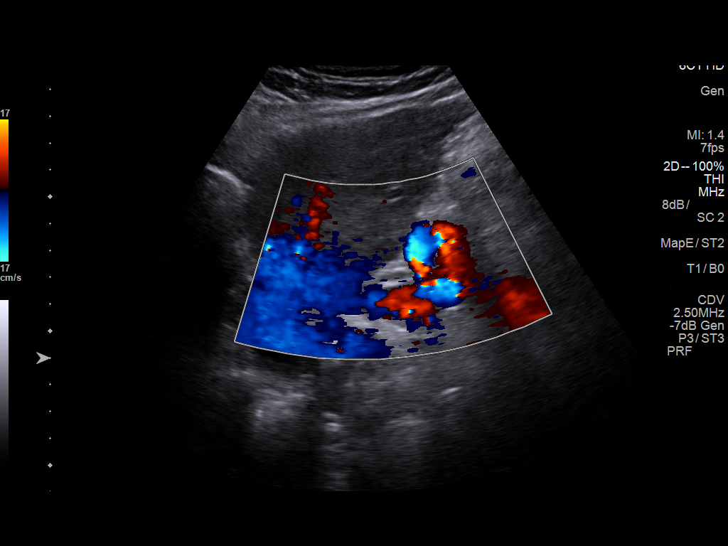
[im 48/48]
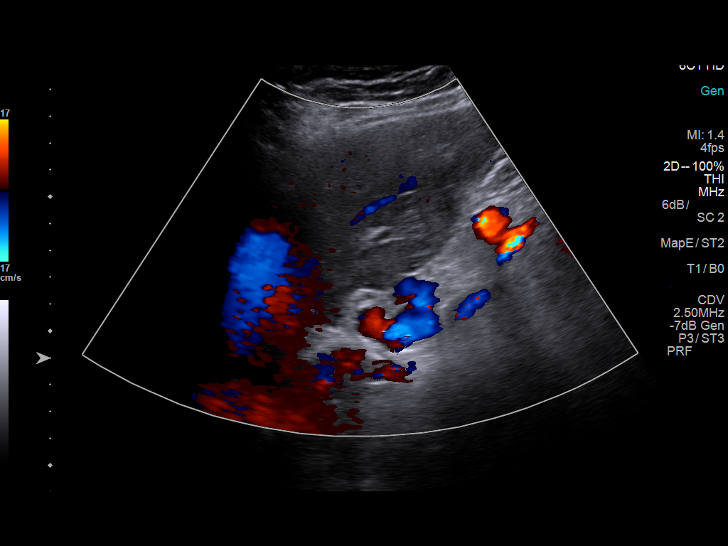

[14 of 25 positions shown; findings below may reference images not displayed]

FINDINGS: Gallbladder:

Gallbladder is well distended. A single echogenic focus is noted
which is non mobile without posterior shadowing consistent with
small polyp. No gallbladder wall thickening or pericholecystic fluid
is noted.

Common bile duct:

Diameter: 3 mm.

Liver:

Generalized increased echogenicity is noted throughout the liver.
Mild heterogeneity is noted as well. No focal mass lesion is seen.
Mild nodularity is noted along the liver contour. Portal vein is
patent on color Doppler imaging with normal direction of blood flow
towards the liver.
IMPRESSION: Changes of hepatic cirrhosis.

Gallbladder polyp.

## 2020-09-10 ENCOUNTER — Other Ambulatory Visit: Payer: Self-pay | Admitting: Infectious Disease

## 2020-09-10 DIAGNOSIS — K219 Gastro-esophageal reflux disease without esophagitis: Secondary | ICD-10-CM

## 2020-09-10 DIAGNOSIS — G4700 Insomnia, unspecified: Secondary | ICD-10-CM

## 2020-09-10 DIAGNOSIS — K8689 Other specified diseases of pancreas: Secondary | ICD-10-CM

## 2020-09-10 DIAGNOSIS — B20 Human immunodeficiency virus [HIV] disease: Secondary | ICD-10-CM

## 2020-09-10 DIAGNOSIS — I10 Essential (primary) hypertension: Secondary | ICD-10-CM

## 2020-09-22 DIAGNOSIS — T7840XA Allergy, unspecified, initial encounter: Secondary | ICD-10-CM

## 2020-09-22 HISTORY — DX: Allergy, unspecified, initial encounter: T78.40XA

## 2020-10-17 ENCOUNTER — Other Ambulatory Visit: Payer: Self-pay | Admitting: Infectious Disease

## 2020-10-17 DIAGNOSIS — B20 Human immunodeficiency virus [HIV] disease: Secondary | ICD-10-CM

## 2020-11-14 ENCOUNTER — Other Ambulatory Visit: Payer: Self-pay | Admitting: Infectious Disease

## 2020-11-14 DIAGNOSIS — N529 Male erectile dysfunction, unspecified: Secondary | ICD-10-CM

## 2020-11-14 DIAGNOSIS — E291 Testicular hypofunction: Secondary | ICD-10-CM

## 2020-11-14 DIAGNOSIS — R5383 Other fatigue: Secondary | ICD-10-CM

## 2020-11-15 ENCOUNTER — Encounter (INDEPENDENT_AMBULATORY_CARE_PROVIDER_SITE_OTHER): Payer: Self-pay | Admitting: *Deleted

## 2020-11-15 ENCOUNTER — Ambulatory Visit (INDEPENDENT_AMBULATORY_CARE_PROVIDER_SITE_OTHER): Payer: Medicare Other | Admitting: Infectious Disease

## 2020-11-15 ENCOUNTER — Encounter: Payer: Self-pay | Admitting: Infectious Disease

## 2020-11-15 ENCOUNTER — Other Ambulatory Visit: Payer: Self-pay

## 2020-11-15 VITALS — BP 133/85 | HR 76 | Temp 97.4°F | Ht 68.0 in | Wt 143.0 lb

## 2020-11-15 DIAGNOSIS — I10 Essential (primary) hypertension: Secondary | ICD-10-CM | POA: Diagnosis not present

## 2020-11-15 DIAGNOSIS — B20 Human immunodeficiency virus [HIV] disease: Secondary | ICD-10-CM

## 2020-11-15 DIAGNOSIS — Z006 Encounter for examination for normal comparison and control in clinical research program: Secondary | ICD-10-CM

## 2020-11-15 DIAGNOSIS — D696 Thrombocytopenia, unspecified: Secondary | ICD-10-CM | POA: Diagnosis not present

## 2020-11-15 DIAGNOSIS — E881 Lipodystrophy, not elsewhere classified: Secondary | ICD-10-CM | POA: Diagnosis not present

## 2020-11-15 DIAGNOSIS — K8689 Other specified diseases of pancreas: Secondary | ICD-10-CM | POA: Diagnosis not present

## 2020-11-15 DIAGNOSIS — Z79899 Other long term (current) drug therapy: Secondary | ICD-10-CM | POA: Diagnosis not present

## 2020-11-15 NOTE — Progress Notes (Signed)
Chief complaint: For HIV and pancreatic insufficiency, and hypertension he is desiring a third Covid Covid Moderna vaccine Subjective:    Patient ID: Noah Henderson, male    DOB: Mar 08, 1951, 70 y.o.   MRN: [id number]  HPI  70  year old Caucasian man with HIV, previously well controlled on viramune XR and truvada with breakthrough viremia found to have a K103 mutation changed to Prezcobix and truvada--> Prezcobix and Descovy + MVC vs DTG with good virological suppression and stable CD4 count.  He is now only on Symtuza  He is currently enrolled into [facility]  At his last visit in March she was still suppressed.  He had some concern about low testosterone but we checked the levels they were normal.  He is continue on Symtuza which he takes religiously.  Try to enroll him into the [facility] study but I believe his isolate hep B Core ab kept him out of this study        Past Medical History:  Diagnosis Date  . AIDS (HCC)   . Anemia   . Anxiety   . Balance problems 03/26/2016  . Dementia (HCC)   . Depression   . 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 HIV lipodystrophy (HCC)   . Hyperlipidemia   . 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 Muscle spasm 03/26/2016  . Pancreatitis   . Thrombocytopenia (HCC)   . Unintentional weight loss 06/17/2017    No past surgical history on file.  Family History  Problem Relation Age of Onset  . Heart disease Mother   . Heart disease Father   . Mental illness Father   . Heart disease Brother       Social History   Socioeconomic History  . Marital status: Single    Spouse name: Not on file  . Number of children: Not on file  . Years of education: Not on file  . Highest education level: Not on file  Occupational History  . Not on file  Tobacco Use  . Smoking status: Never Smoker  . Smokeless tobacco: Never Used  Substance and Sexual Activity  . Alcohol  use: No    Alcohol/week: 0.0 standard drinks  . Drug use: No  . Sexual activity: Yes    Partners: Male    Birth control/protection: Condom    Comment: pt. given condoms  Other Topics Concern  . Not on file  Social History Narrative  . Not on file   Social Determinants of Health   Financial Resource Strain: Not on file  Food Insecurity: Not on file  Transportation Needs: Not on file  Physical Activity: Not on file  Stress: Not on file  Social Connections: Not on file    Allergies  Allergen Reactions  . Penicillins      Current Outpatient Medications:  .  amitriptyline (ELAVIL) 50 MG tablet, TAKE ONE TABLET BY MOUTH EVERY NIGHT AT BEDTIME, Disp: 30 tablet, Rfl: 2 .  CREON 24000-76000 units CPEP, TAKE 2 CAPSULES BY MOUTH THREE TIMES DAILY, Disp: 180 capsule, Rfl: 2 .  diphenoxylate-atropine (LOMOTIL) 2.5-0.025 MG tablet, TAKE 1 TABLET BY MOUTH TWICE DAILY FOR LOOSE STOOL, Disp: 30 tablet, Rfl: 1 .  Ensure (ENSURE), Take 1 Can by mouth 2 (two) times daily between meals for 30 doses. For aids wasting syndrome crhonic pancreatitis, Disp: 7110 mL, Rfl: 5 .  feeding supplement (ENSURE IMMUNE HEALTH) LIQD, Take 237 mLs by mouth 3 (  three) times daily with meals., Disp: 90 Bottle, Rfl: 11 .  metoprolol succinate (TOPROL-XL) 100 MG 24 hr tablet, TAKE 1 TABLET BY MOUTH EVERY DAY WITH OR IMMEDIATELY FOLLOWING A MEAL, Disp: 30 tablet, Rfl: 2 .  omeprazole (PRILOSEC) 40 MG capsule, TAKE ONE CAPSULE BY MOUTH EVERY DAY, Disp: 30 capsule, Rfl: 2 .  Pitavastatin Calcium 4 MG TABS, Take 4 mg by mouth daily. Study provided, drug may be placebo, Disp: , Rfl:  .  sildenafil (VIAGRA) 25 MG tablet, TAKE 1 TABLET BY MOUTH ONCE DAILY AS NEEDED FOR ERECTILE DYSFUNCTION, Disp: 10 tablet, Rfl: 2 .  SYMTUZA 800-150-200-10 MG TABS, TAKE 1 TABLET BY MOUTH DAILY WITH BREAKFAST, Disp: 30 tablet, Rfl: 2   Review of Systems  Constitutional: Negative for activity change, appetite change, chills, diaphoresis and  fever.  HENT: Negative for congestion, ear pain, rhinorrhea, sinus pressure, sneezing, sore throat and trouble swallowing.   Eyes: Negative for photophobia and visual disturbance.  Respiratory: Negative for cough, chest tightness, shortness of breath, wheezing and stridor.   Cardiovascular: Negative for chest pain, palpitations and leg swelling.  Gastrointestinal: Negative for abdominal distention, abdominal pain, anal bleeding, blood in stool, constipation, diarrhea, nausea and vomiting.  Genitourinary: Negative for difficulty urinating, dysuria, flank pain, frequency and hematuria.  Musculoskeletal: Negative for arthralgias, back pain, gait problem, joint swelling, myalgias, neck pain and neck stiffness.  Skin: Negative for color change, pallor, rash and wound.  Neurological: Negative for dizziness, tremors, weakness and light-headedness.  Hematological: Negative for adenopathy. Does not bruise/bleed easily.  Psychiatric/Behavioral: Negative for agitation, behavioral problems, confusion, dysphoric mood and hallucinations. The patient is not hyperactive.        Objective:   Physical Exam Vitals and nursing note reviewed.  Constitutional:      General: He is not in acute distress.    Appearance: He is not diaphoretic.  HENT:     Head: Normocephalic and atraumatic.     Right Ear: Hearing, tympanic membrane, ear canal and external ear normal.     Left Ear: Hearing normal.     Mouth/Throat:     Pharynx: No oropharyngeal exudate.  Eyes:     General: No scleral icterus.    Conjunctiva/sclera: Conjunctivae normal.  Neck:     Vascular: No JVD.  Cardiovascular:     Rate and Rhythm: Normal rate and regular rhythm.  Pulmonary:     Effort: Pulmonary effort is normal. No respiratory distress.     Breath sounds: No wheezing.  Abdominal:     General: There is no distension.     Palpations: Abdomen is soft.  Musculoskeletal:        General: No tenderness or deformity.     Cervical back:  Normal range of motion and neck supple.  Lymphadenopathy:     Cervical: No cervical adenopathy.  Skin:    General: Skin is warm and dry.     Coloration: Skin is not pale.     Findings: No erythema.  Neurological:     General: No focal deficit present.     Mental Status: He is alert and oriented to person, place, and time.     Motor: No abnormal muscle tone.     Coordination: Coordination normal.  Psychiatric:        Mood and Affect: Mood normal.        Speech: Speech is delayed.        Behavior: Behavior normal.        Thought Content: Thought  content normal.        Judgment: Judgment normal.           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

## 2020-11-15 NOTE — Research (Signed)
Noah Henderson was here for Reprieve month 80 visit. Noah Henderson denies any new problems or medications. Noah Henderson was sick for about a week last month with Covid symptoms, but did n't get tested. Noah Henderson will be returning in June for the next study visit.

## 2020-11-16 LAB — T-HELPER CELL (CD4) - (RCID CLINIC ONLY)
CD4 % Helper T Cell: 36 % (ref 33–65)
CD4 T Cell Abs: 274 /uL — ABNORMAL LOW (ref 400–1790)

## 2020-11-17 LAB — COMPLETE METABOLIC PANEL WITH GFR
AG Ratio: 1.5 (calc) (ref 1.0–2.5)
ALT: 11 U/L (ref 9–46)
AST: 18 U/L (ref 10–35)
Albumin: 4.1 g/dL (ref 3.6–5.1)
Alkaline phosphatase (APISO): 76 U/L (ref 35–144)
BUN: 10 mg/dL (ref 7–25)
CO2: 23 mmol/L (ref 20–32)
Calcium: 9 mg/dL (ref 8.6–10.3)
Chloride: 106 mmol/L (ref 98–110)
Creat: 0.85 mg/dL (ref 0.70–1.25)
GFR, Est African American: 104 mL/min/{1.73_m2} (ref >=60)
GFR, Est Non African American: 90 mL/min/{1.73_m2} (ref >=60)
Globulin: 2.7 g/dL (calc) (ref 1.9–3.7)
Glucose, Bld: 86 mg/dL (ref 65–99)
Potassium: 3.7 mmol/L (ref 3.5–5.3)
Sodium: 139 mmol/L (ref 135–146)
Total Bilirubin: 1 mg/dL (ref 0.2–1.2)
Total Protein: 6.8 g/dL (ref 6.1–8.1)

## 2020-11-17 LAB — LIPID PANEL
Cholesterol: 148 mg/dL (ref <200)
HDL: 41 mg/dL (ref >=40)
LDL Cholesterol (Calc): 82 mg/dL (calc)
Non-HDL Cholesterol (Calc): 107 mg/dL (calc) (ref <130)
Total CHOL/HDL Ratio: 3.6 (calc) (ref <5.0)
Triglycerides: 149 mg/dL (ref <150)

## 2020-11-17 LAB — CBC WITH DIFFERENTIAL/PLATELET
Absolute Monocytes: 331 cells/uL (ref 200–950)
Basophils Absolute: 19 cells/uL (ref 0–200)
Basophils Relative: 0.4 %
Eosinophils Absolute: 91 cells/uL (ref 15–500)
Eosinophils Relative: 1.9 %
HCT: 37.5 % — ABNORMAL LOW (ref 38.5–50.0)
Hemoglobin: 13.4 g/dL (ref 13.2–17.1)
Lymphs Abs: 782 cells/uL — ABNORMAL LOW (ref 850–3900)
MCH: 30.7 pg (ref 27.0–33.0)
MCHC: 35.7 g/dL (ref 32.0–36.0)
MCV: 85.8 fL (ref 80.0–100.0)
MPV: 10.5 fL (ref 7.5–12.5)
Monocytes Relative: 6.9 %
Neutro Abs: 3576 cells/uL (ref 1500–7800)
Neutrophils Relative %: 74.5 %
Platelets: 76 10*3/uL — ABNORMAL LOW (ref 140–400)
RBC: 4.37 10*6/uL (ref 4.20–5.80)
RDW: 13.8 % (ref 11.0–15.0)
Total Lymphocyte: 16.3 %
WBC: 4.8 10*3/uL (ref 3.8–10.8)

## 2020-11-17 LAB — HIV-1 RNA QUANT-NO REFLEX-BLD
HIV 1 RNA Quant: <20 Copies/mL
HIV-1 RNA Quant, Log: <1.3 Log cps/mL

## 2020-11-17 LAB — RPR: RPR Ser Ql: NONREACTIVE

## 2020-12-21 ENCOUNTER — Other Ambulatory Visit: Payer: Self-pay | Admitting: Infectious Disease

## 2020-12-21 DIAGNOSIS — K219 Gastro-esophageal reflux disease without esophagitis: Secondary | ICD-10-CM

## 2020-12-21 DIAGNOSIS — B20 Human immunodeficiency virus [HIV] disease: Secondary | ICD-10-CM

## 2020-12-21 DIAGNOSIS — G4700 Insomnia, unspecified: Secondary | ICD-10-CM

## 2020-12-21 DIAGNOSIS — I10 Essential (primary) hypertension: Secondary | ICD-10-CM

## 2020-12-21 DIAGNOSIS — K8689 Other specified diseases of pancreas: Secondary | ICD-10-CM

## 2021-02-26 ENCOUNTER — Other Ambulatory Visit (HOSPITAL_COMMUNITY): Payer: Self-pay

## 2021-02-26 MED ORDER — STUDY - REPRIEVE A5332 - PITAVASTATIN 4MG OR PLACEBO TABLET (PI-VAN DAM)
ORAL_TABLET | ORAL | 0 refills | Status: DC
  Filled 2021-02-26: qty 90, 90d supply, fill #0

## 2021-02-27 ENCOUNTER — Encounter (INDEPENDENT_AMBULATORY_CARE_PROVIDER_SITE_OTHER): Payer: Self-pay | Admitting: *Deleted

## 2021-02-27 ENCOUNTER — Other Ambulatory Visit: Payer: Self-pay

## 2021-02-27 VITALS — BP 103/61 | HR 56 | Temp 98.0°F | Wt 151.2 lb

## 2021-02-27 DIAGNOSIS — Z006 Encounter for examination for normal comparison and control in clinical research program: Secondary | ICD-10-CM

## 2021-02-27 NOTE — Research (Signed)
Noah Henderson) was here for his month 84 visit for Reprieve. He denies any new problems or medications and is doing well taking all his meds. He will be coming to see Dr. Daiva Eves in July and research again in October.

## 2021-03-06 ENCOUNTER — Other Ambulatory Visit (HOSPITAL_COMMUNITY): Payer: Self-pay

## 2021-03-08 ENCOUNTER — Telehealth: Payer: Self-pay

## 2021-03-08 NOTE — Telephone Encounter (Signed)
Do you want to continue filling the Lomotil?

## 2021-03-14 NOTE — Telephone Encounter (Signed)
Called patient to ask if diarrhea was a chronic issue or new and acute, no answer and no secure voicemail.   Sandie Ano, RN

## 2021-04-04 ENCOUNTER — Other Ambulatory Visit: Payer: Self-pay

## 2021-04-04 ENCOUNTER — Ambulatory Visit
Admission: RE | Admit: 2021-04-04 | Discharge: 2021-04-04 | Disposition: A | Discharge status: Home / Self Care | Payer: Medicare Other | Source: Ambulatory Visit | Attending: Infectious Disease | Admitting: Infectious Disease

## 2021-04-04 ENCOUNTER — Ambulatory Visit (INDEPENDENT_AMBULATORY_CARE_PROVIDER_SITE_OTHER): Payer: Medicare Other | Admitting: Infectious Disease

## 2021-04-04 ENCOUNTER — Encounter: Payer: Self-pay | Admitting: Infectious Disease

## 2021-04-04 ENCOUNTER — Other Ambulatory Visit (HOSPITAL_COMMUNITY)
Admission: RE | Admit: 2021-04-04 | Discharge: 2021-04-04 | Disposition: A | Discharge status: Home / Self Care | Payer: Medicare Other | Source: Ambulatory Visit | Attending: Infectious Disease | Admitting: Infectious Disease

## 2021-04-04 VITALS — BP 125/74 | HR 53 | Temp 97.9°F | Wt 150.4 lb

## 2021-04-04 DIAGNOSIS — I10 Essential (primary) hypertension: Secondary | ICD-10-CM | POA: Diagnosis not present

## 2021-04-04 DIAGNOSIS — E8814 HIV-associated lipodystrophy: Secondary | ICD-10-CM

## 2021-04-04 DIAGNOSIS — K8689 Other specified diseases of pancreas: Secondary | ICD-10-CM | POA: Diagnosis not present

## 2021-04-04 DIAGNOSIS — R634 Abnormal weight loss: Secondary | ICD-10-CM

## 2021-04-04 DIAGNOSIS — K5904 Chronic idiopathic constipation: Secondary | ICD-10-CM | POA: Insufficient documentation

## 2021-04-04 DIAGNOSIS — R053 Chronic cough: Secondary | ICD-10-CM | POA: Diagnosis not present

## 2021-04-04 DIAGNOSIS — K5900 Constipation, unspecified: Secondary | ICD-10-CM

## 2021-04-04 DIAGNOSIS — B20 Human immunodeficiency virus [HIV] disease: Secondary | ICD-10-CM

## 2021-04-04 DIAGNOSIS — E881 Lipodystrophy, not elsewhere classified: Secondary | ICD-10-CM | POA: Diagnosis not present

## 2021-04-04 DIAGNOSIS — D696 Thrombocytopenia, unspecified: Secondary | ICD-10-CM

## 2021-04-04 DIAGNOSIS — R059 Cough, unspecified: Secondary | ICD-10-CM | POA: Diagnosis not present

## 2021-04-04 HISTORY — DX: Constipation, unspecified: K59.00

## 2021-04-04 HISTORY — DX: Chronic cough: R05.3
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
## 2021-04-05 LAB — URINE CYTOLOGY ANCILLARY ONLY
Chlamydia: NEGATIVE
Comment: NEGATIVE
Comment: NORMAL
Neisseria Gonorrhea: NEGATIVE

## 2021-04-05 LAB — T-HELPER CELL (CD4) - (RCID CLINIC ONLY)
CD4 % Helper T Cell: 34 % (ref 33–65)
CD4 T Cell Abs: 352 /uL — ABNORMAL LOW (ref 400–1790)

## 2021-04-08 LAB — CBC WITH DIFFERENTIAL/PLATELET
Absolute Monocytes: 357 cells/uL (ref 200–950)
Basophils Absolute: 41 cells/uL (ref 0–200)
Basophils Relative: 0.8 %
Eosinophils Absolute: 112 cells/uL (ref 15–500)
Eosinophils Relative: 2.2 %
HCT: 38.7 % (ref 38.5–50.0)
Hemoglobin: 14 g/dL (ref 13.2–17.1)
Lymphs Abs: 1168 cells/uL (ref 850–3900)
MCH: 31.3 pg (ref 27.0–33.0)
MCHC: 36.2 g/dL — ABNORMAL HIGH (ref 32.0–36.0)
MCV: 86.6 fL (ref 80.0–100.0)
MPV: 10 fL (ref 7.5–12.5)
Monocytes Relative: 7 %
Neutro Abs: 3422 cells/uL (ref 1500–7800)
Neutrophils Relative %: 67.1 %
Platelets: 92 10*3/uL — ABNORMAL LOW (ref 140–400)
RBC: 4.47 10*6/uL (ref 4.20–5.80)
RDW: 14 % (ref 11.0–15.0)
Total Lymphocyte: 22.9 %
WBC: 5.1 10*3/uL (ref 3.8–10.8)

## 2021-04-08 LAB — COMPLETE METABOLIC PANEL WITH GFR
AG Ratio: 1.4 (calc) (ref 1.0–2.5)
ALT: 15 U/L (ref 9–46)
AST: 23 U/L (ref 10–35)
Albumin: 4.1 g/dL (ref 3.6–5.1)
Alkaline phosphatase (APISO): 73 U/L (ref 35–144)
BUN: 10 mg/dL (ref 7–25)
CO2: 23 mmol/L (ref 20–32)
Calcium: 9.3 mg/dL (ref 8.6–10.3)
Chloride: 105 mmol/L (ref 98–110)
Creat: 1 mg/dL (ref 0.70–1.35)
Globulin: 3 g/dL (calc) (ref 1.9–3.7)
Glucose, Bld: 81 mg/dL (ref 65–99)
Potassium: 4.1 mmol/L (ref 3.5–5.3)
Sodium: 138 mmol/L (ref 135–146)
Total Bilirubin: 1.6 mg/dL — ABNORMAL HIGH (ref 0.2–1.2)
Total Protein: 7.1 g/dL (ref 6.1–8.1)
eGFR: 82 mL/min/{1.73_m2} (ref >=60)

## 2021-04-08 LAB — HIV-1 RNA QUANT-NO REFLEX-BLD
HIV 1 RNA Quant: NOT DETECTED Copies/mL
HIV-1 RNA Quant, Log: NOT DETECTED Log cps/mL

## 2021-04-08 LAB — RPR: RPR Ser Ql: NONREACTIVE

## 2021-04-10 ENCOUNTER — Telehealth: Payer: Self-pay

## 2021-04-10 DIAGNOSIS — I10 Essential (primary) hypertension: Secondary | ICD-10-CM

## 2021-04-10 DIAGNOSIS — K219 Gastro-esophageal reflux disease without esophagitis: Secondary | ICD-10-CM

## 2021-04-10 DIAGNOSIS — G4700 Insomnia, unspecified: Secondary | ICD-10-CM

## 2021-04-10 DIAGNOSIS — K8689 Other specified diseases of pancreas: Secondary | ICD-10-CM

## 2021-04-10 DIAGNOSIS — B20 Human immunodeficiency virus [HIV] disease: Secondary | ICD-10-CM

## 2021-04-10 MED ORDER — OMEPRAZOLE 40 MG PO CPDR: DELAYED_RELEASE_CAPSULE | ORAL | 1 refills | Status: DC

## 2021-04-10 MED ORDER — METOPROLOL SUCCINATE ER 100 MG PO TB24: ORAL_TABLET | ORAL | 1 refills | Status: DC

## 2021-04-10 MED ORDER — SYMTUZA 800-150-200-10 MG PO TABS: 1.0000 | ORAL_TABLET | Freq: Every day | ORAL | 2 refills | Status: DC

## 2021-04-10 MED ORDER — AMITRIPTYLINE HCL 50 MG PO TABS: 50.0000 mg | ORAL_TABLET | Freq: Every day | ORAL | 1 refills | Status: DC

## 2021-04-10 MED ORDER — CREON 24000-76000 UNITS PO CPEP: 2.0000 | ORAL_CAPSULE | Freq: Three times a day (TID) | ORAL | 1 refills | Status: DC

## 2021-04-10 NOTE — Telephone Encounter (Signed)
Received faxed refill request for patient's omeprazole, creon, metoprolol, and amitriptyline. Will route to provider.   Sandie Ano, RN

## 2021-04-10 NOTE — Addendum Note (Signed)
Addended by: Linna Hoff D on: 04/10/2021 11:54 AM   Modules accepted: Orders

## 2021-05-09 ENCOUNTER — Telehealth: Payer: Self-pay

## 2021-05-09 NOTE — Telephone Encounter (Signed)
Received call from pharmacy for patient contact information. Confirmed patient phone number as [facility] has the same number as pharmacy. Advised pharmacy that staff will send patient a Mychart message to reach out to them to schedule for delivery.   Valarie Cones

## 2021-05-15 ENCOUNTER — Other Ambulatory Visit: Payer: Self-pay

## 2021-05-15 DIAGNOSIS — E291 Testicular hypofunction: Secondary | ICD-10-CM

## 2021-05-15 DIAGNOSIS — N529 Male erectile dysfunction, unspecified: Secondary | ICD-10-CM

## 2021-05-15 DIAGNOSIS — R5383 Other fatigue: Secondary | ICD-10-CM

## 2021-05-15 MED ORDER — SILDENAFIL CITRATE 25 MG PO TABS: 25.0000 mg | ORAL_TABLET | Freq: Every day | ORAL | 5 refills | Status: AC | PRN

## 2021-06-24 ENCOUNTER — Telehealth: Payer: Self-pay

## 2021-06-24 DIAGNOSIS — B20 Human immunodeficiency virus [HIV] disease: Secondary | ICD-10-CM

## 2021-06-24 MED ORDER — SYMTUZA 800-150-200-10 MG PO TABS: 1.0000 | ORAL_TABLET | Freq: Every day | ORAL | 3 refills | Status: DC

## 2021-06-24 NOTE — Telephone Encounter (Signed)
Patient left message requesting refill. Symtuza sent to [facility], called patient to let him know, no answer. Left HIPAA compliant voicemail requesting callback.   Patient left VM with call back number of [phone]  Sandie Ano, RN

## 2021-06-24 NOTE — Telephone Encounter (Signed)
Patient returning call, relayed that Symtuza was sent in. He says there were a total of 4 medications that need refills. Asked him to have the pharmacy send another request as we do not have a request. He states he is trying to get set up with a PCP.   Sandie Ano, RN

## 2021-06-25 ENCOUNTER — Encounter (INDEPENDENT_AMBULATORY_CARE_PROVIDER_SITE_OTHER): Payer: Self-pay | Admitting: *Deleted

## 2021-06-25 ENCOUNTER — Other Ambulatory Visit: Payer: Self-pay

## 2021-06-25 VITALS — BP 125/69 | HR 69 | Temp 97.8°F | Wt 152.2 lb

## 2021-06-25 DIAGNOSIS — Z006 Encounter for examination for normal comparison and control in clinical research program: Secondary | ICD-10-CM

## 2021-06-25 NOTE — Research (Signed)
Noah Henderson seen today for his month 35 Reprieve visit. No new complaints or medications. Verbalized excellent adherence with his ARV and study medications. Denied any muscle aches or weakness. Scheduled to return in February to see Dr. Daiva Eves and for his next study visit.

## 2021-06-26 ENCOUNTER — Encounter: Payer: Medicare Other | Admitting: *Deleted

## 2021-06-26 ENCOUNTER — Telehealth: Payer: Self-pay

## 2021-06-26 DIAGNOSIS — I10 Essential (primary) hypertension: Secondary | ICD-10-CM

## 2021-06-26 DIAGNOSIS — K8689 Other specified diseases of pancreas: Secondary | ICD-10-CM

## 2021-06-26 DIAGNOSIS — K219 Gastro-esophageal reflux disease without esophagitis: Secondary | ICD-10-CM

## 2021-06-26 DIAGNOSIS — G4700 Insomnia, unspecified: Secondary | ICD-10-CM

## 2021-06-26 MED ORDER — CREON 24000-76000 UNITS PO CPEP: 2.0000 | ORAL_CAPSULE | Freq: Three times a day (TID) | ORAL | 1 refills | Status: DC

## 2021-06-26 MED ORDER — AMITRIPTYLINE HCL 50 MG PO TABS: 50.0000 mg | ORAL_TABLET | Freq: Every day | ORAL | 1 refills | Status: DC

## 2021-06-26 MED ORDER — OMEPRAZOLE 40 MG PO CPDR: DELAYED_RELEASE_CAPSULE | ORAL | 1 refills | Status: DC

## 2021-06-26 MED ORDER — METOPROLOL SUCCINATE ER 100 MG PO TB24: ORAL_TABLET | ORAL | 1 refills | Status: DC

## 2021-06-26 NOTE — Telephone Encounter (Signed)
Received call from [facility] requesting refills for metoprolol, amitriptyline, omeprazole, and creon. Will route to provider.   Sandie Ano, RN

## 2021-06-26 NOTE — Addendum Note (Signed)
Addended by: Linna Hoff D on: 06/26/2021 03:50 PM   Modules accepted: Orders

## 2021-09-03 ENCOUNTER — Other Ambulatory Visit: Payer: Self-pay

## 2021-09-03 DIAGNOSIS — K8689 Other specified diseases of pancreas: Secondary | ICD-10-CM

## 2021-09-03 DIAGNOSIS — G4700 Insomnia, unspecified: Secondary | ICD-10-CM

## 2021-09-03 DIAGNOSIS — I10 Essential (primary) hypertension: Secondary | ICD-10-CM

## 2021-09-03 DIAGNOSIS — K219 Gastro-esophageal reflux disease without esophagitis: Secondary | ICD-10-CM

## 2021-10-08 ENCOUNTER — Ambulatory Visit: Payer: Medicare Other | Admitting: Nurse Practitioner

## 2021-10-21 ENCOUNTER — Ambulatory Visit: Payer: Medicare Other | Admitting: Infectious Disease

## 2021-10-24 ENCOUNTER — Ambulatory Visit (INDEPENDENT_AMBULATORY_CARE_PROVIDER_SITE_OTHER): Payer: Medicare Other | Admitting: Infectious Disease

## 2021-10-24 ENCOUNTER — Other Ambulatory Visit: Payer: Self-pay

## 2021-10-24 ENCOUNTER — Encounter (INDEPENDENT_AMBULATORY_CARE_PROVIDER_SITE_OTHER): Payer: Self-pay | Admitting: *Deleted

## 2021-10-24 VITALS — Wt 156.2 lb

## 2021-10-24 DIAGNOSIS — Z006 Encounter for examination for normal comparison and control in clinical research program: Secondary | ICD-10-CM

## 2021-10-24 DIAGNOSIS — E881 Lipodystrophy, not elsewhere classified: Secondary | ICD-10-CM | POA: Diagnosis not present

## 2021-10-24 DIAGNOSIS — K219 Gastro-esophageal reflux disease without esophagitis: Secondary | ICD-10-CM | POA: Diagnosis not present

## 2021-10-24 DIAGNOSIS — Z23 Encounter for immunization: Secondary | ICD-10-CM

## 2021-10-24 DIAGNOSIS — I10 Essential (primary) hypertension: Secondary | ICD-10-CM

## 2021-10-24 DIAGNOSIS — K8689 Other specified diseases of pancreas: Secondary | ICD-10-CM | POA: Diagnosis not present

## 2021-10-24 DIAGNOSIS — B20 Human immunodeficiency virus [HIV] disease: Secondary | ICD-10-CM | POA: Diagnosis not present

## 2021-10-24 DIAGNOSIS — Z79899 Other long term (current) drug therapy: Secondary | ICD-10-CM | POA: Diagnosis not present

## 2021-10-24 MED ORDER — SYMTUZA 800-150-200-10 MG PO TABS: 1.0000 | ORAL_TABLET | Freq: Every day | ORAL | 11 refills | Status: DC

## 2021-10-24 NOTE — Research (Signed)
Noah Henderson seen today for his month 68 Reprieve visit. No new complaints or medications. Verbalized excellent adherence with his study medication. Denied any muscle aches or weakness. He will return in May for his next study visit.
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
## 2021-10-24 NOTE — Addendum Note (Signed)
Addended by: Juanita Laster on: 10/24/2021 10:53 AM   Modules accepted: Orders

## 2021-10-28 LAB — CBC WITH DIFFERENTIAL/PLATELET
Absolute Monocytes: 302 cells/uL (ref 200–950)
Basophils Absolute: 18 cells/uL (ref 0–200)
Basophils Relative: 0.4 %
Eosinophils Absolute: 81 cells/uL (ref 15–500)
Eosinophils Relative: 1.8 %
HCT: 36.6 % — ABNORMAL LOW (ref 38.5–50.0)
Hemoglobin: 13.1 g/dL — ABNORMAL LOW (ref 13.2–17.1)
Lymphs Abs: 918 cells/uL (ref 850–3900)
MCH: 31.6 pg (ref 27.0–33.0)
MCHC: 35.8 g/dL (ref 32.0–36.0)
MCV: 88.2 fL (ref 80.0–100.0)
MPV: 10.4 fL (ref 7.5–12.5)
Monocytes Relative: 6.7 %
Neutro Abs: 3182 cells/uL (ref 1500–7800)
Neutrophils Relative %: 70.7 %
Platelets: 75 10*3/uL — ABNORMAL LOW (ref 140–400)
RBC: 4.15 10*6/uL — ABNORMAL LOW (ref 4.20–5.80)
RDW: 14.2 % (ref 11.0–15.0)
Total Lymphocyte: 20.4 %
WBC: 4.5 10*3/uL (ref 3.8–10.8)

## 2021-10-28 LAB — C. TRACHOMATIS/N. GONORRHOEAE RNA
C. trachomatis RNA, TMA: NOT DETECTED
N. gonorrhoeae RNA, TMA: NOT DETECTED

## 2021-10-28 LAB — COMPLETE METABOLIC PANEL WITH GFR
AG Ratio: 1.5 (calc) (ref 1.0–2.5)
ALT: 13 U/L (ref 9–46)
AST: 20 U/L (ref 10–35)
Albumin: 4.2 g/dL (ref 3.6–5.1)
Alkaline phosphatase (APISO): 68 U/L (ref 35–144)
BUN: 9 mg/dL (ref 7–25)
CO2: 23 mmol/L (ref 20–32)
Calcium: 9.3 mg/dL (ref 8.6–10.3)
Chloride: 106 mmol/L (ref 98–110)
Creat: 0.85 mg/dL (ref 0.70–1.35)
Globulin: 2.8 g/dL (calc) (ref 1.9–3.7)
Glucose, Bld: 93 mg/dL (ref 65–99)
Potassium: 3.8 mmol/L (ref 3.5–5.3)
Sodium: 138 mmol/L (ref 135–146)
Total Bilirubin: 1.4 mg/dL — ABNORMAL HIGH (ref 0.2–1.2)
Total Protein: 7 g/dL (ref 6.1–8.1)
eGFR: 95 mL/min/{1.73_m2} (ref >=60)

## 2021-10-28 LAB — LIPID PANEL
Cholesterol: 157 mg/dL (ref <200)
HDL: 42 mg/dL (ref >=40)
LDL Cholesterol (Calc): 91 mg/dL (calc)
Non-HDL Cholesterol (Calc): 115 mg/dL (calc) (ref <130)
Total CHOL/HDL Ratio: 3.7 (calc) (ref <5.0)
Triglycerides: 142 mg/dL (ref <150)

## 2021-10-28 LAB — HIV-1 RNA QUANT-NO REFLEX-BLD
HIV 1 RNA Quant: NOT DETECTED Copies/mL
HIV-1 RNA Quant, Log: NOT DETECTED Log cps/mL

## 2021-10-28 LAB — T-HELPER CELLS (CD4) COUNT (NOT AT ARMC)
Absolute CD4: 283 cells/uL — ABNORMAL LOW (ref 490–1740)
CD4 T Helper %: 31 % (ref 30–61)
Total lymphocyte count: 900 cells/uL (ref 850–3900)

## 2021-10-28 LAB — RPR: RPR Ser Ql: NONREACTIVE

## 2021-11-14 ENCOUNTER — Ambulatory Visit: Payer: Medicare Other | Admitting: Nurse Practitioner

## 2022-01-22 ENCOUNTER — Ambulatory Visit (INDEPENDENT_AMBULATORY_CARE_PROVIDER_SITE_OTHER): Payer: Medicare Other | Admitting: Nurse Practitioner

## 2022-01-22 ENCOUNTER — Encounter: Payer: Self-pay | Admitting: Nurse Practitioner

## 2022-01-22 VITALS — BP 130/70 | HR 61 | Ht 68.0 in | Wt 157.0 lb

## 2022-01-22 DIAGNOSIS — Z1211 Encounter for screening for malignant neoplasm of colon: Secondary | ICD-10-CM

## 2022-01-22 DIAGNOSIS — I10 Essential (primary) hypertension: Secondary | ICD-10-CM

## 2022-01-22 DIAGNOSIS — K8689 Other specified diseases of pancreas: Secondary | ICD-10-CM | POA: Diagnosis not present

## 2022-01-22 DIAGNOSIS — K5900 Constipation, unspecified: Secondary | ICD-10-CM

## 2022-01-22 DIAGNOSIS — T7840XA Allergy, unspecified, initial encounter: Secondary | ICD-10-CM

## 2022-01-22 DIAGNOSIS — K219 Gastro-esophageal reflux disease without esophagitis: Secondary | ICD-10-CM

## 2022-01-22 DIAGNOSIS — B20 Human immunodeficiency virus [HIV] disease: Secondary | ICD-10-CM

## 2022-01-22 MED ORDER — LINACLOTIDE 72 MCG PO CAPS: 72.0000 ug | ORAL_CAPSULE | Freq: Every day | ORAL | 3 refills | Status: DC

## 2022-01-22 MED ORDER — LORATADINE 10 MG PO TABS: 10.0000 mg | ORAL_TABLET | Freq: Every day | ORAL | 11 refills | Status: AC

## 2022-01-22 NOTE — Assessment & Plan Note (Signed)
On omeprazole 40 mg daily ?Continue current medication ?

## 2022-01-22 NOTE — Assessment & Plan Note (Signed)
followed by ID , has had HIV for about 20 years, currently on symtuza 800-150-200 -10mg  tablets daily.   ?Patient encouraged to maintain close follow-up with ID he verbalized understanding,  ?

## 2022-01-22 NOTE — Assessment & Plan Note (Signed)
Chronic condition well-controlled ?Continue Creon24000-76000 units ,2 capsules 3 times daily ?

## 2022-01-22 NOTE — Assessment & Plan Note (Signed)
BP Readings from Last 3 Encounters:  ?01/22/22 130/70  ?06/25/21 125/69  ?04/04/21 125/74  ?Chronic condition well-controlled on metoprolol 100 mg daily ?Continue current medication ?DASH diet advised engage in regular exercises at least 150 minutes weekly ?CMP at next visit ?

## 2022-01-22 NOTE — Patient Instructions (Addendum)
Pleas take linzess tablets daily for constipation.For constipation it is important that you have an adequate intake of fruit and vegetables daily, at least 3 servings of each, as well as water intake of at least 48 ounces daily and regular exercise. ? ?Fiber intake daily is needed, in the form of Bran or Shredded Wheat  ? ? ?Please get your shingles vaccine at your pharmacy., ? ?It is important that you exercise regularly at least 30 minutes 5 times a week.  ?Think about what you will eat, plan ahead. ?Choose " clean, green, fresh or frozen" over canned, processed or packaged foods which are more sugary, salty and fatty. ?70 to 75% of food eaten should be vegetables and fruit. ?Three meals at set times with snacks allowed between meals, but they must be fruit or vegetables. ?Aim to eat over a 12 hour period , example 7 am to 7 pm, and STOP after  your last meal of the day. ?Drink water,generally about 64 ounces per day, no other drink is as healthy. Fruit juice is best enjoyed in a healthy way, by EATING the fruit. ? ?Thanks for choosing [facility], we consider it a privelige to serve you. ? ? ?

## 2022-01-22 NOTE — Progress Notes (Signed)
? ?New Patient Office Visit ? ?Subjective   ? ?Patient ID: Noah Henderson, male    DOB: 05-02-1951  Age: 70 y.o. MRN: [id number] ? ?CC:  ?Chief Complaint  ?Patient presents with  ? New Patient (Initial Visit)  ?  np  ? Bloated  ?  Bloating and fullness for about 6 months   ? Constipation  ?  About 6 months   ? ? 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 Has no previous PCP.  ? ?HIV disease, followed by ID , has had HIV for about 20 years, currently onsymtuza 800-150-200 -10mg  tablets daily.  Patient states that he has been following up closely with infectious disease.  Patient denies fever chills malaise ? ? ?On a research medication , pitavastatin calcium 4mg  , medication will help with memory , has been on the medication for 6 years, follows up every 6 months with the researchers.  ? ? ?Constipation pt c/o constipation, bloating for the past 6 months, has small 2-3 soft BM daily, feels like he is not completely emptying his Bowels, denies diarrhea, abdominal pain, nausea, vomiting. He has tried metamucil, stool softer but they did not work.  Patient would like Linzess prescribed today states that he has seen adverse of this medication on television and would like to try to make.  Patient denies diarrhea, bloody stool, abdominal pain. ? ?Seasonal allergies pt c/o seasonal allergies, has running nose , stuffy nose, sneezing , has been taking OTC Claritin and would like a prescription for this.  ? ?Due for colon cancer screening Cologuard test ordered today ?Patient encouraged to get his shingles vaccine at the pharmacy he verbalized understanding.  ? ?Outpatient Encounter Medications as of 01/22/2022  ?Medication Sig  ? amitriptyline (ELAVIL) 50 MG tablet Take 1 tablet (50 mg total) by mouth at bedtime.  ?  Darunavir-Cobicistat-Emtricitabine-Tenofovir Alafenamide (SYMTUZA) 800-150-200-10 MG TABS Take 1 tablet by mouth daily with breakfast.  ? linaclotide (LINZESS) 72 MCG capsule Take 1 capsule (72 mcg total) by mouth daily before breakfast.  ? loratadine (CLARITIN) 10 MG tablet Take 1 tablet (10 mg total) by mouth daily.  ? metoprolol succinate (TOPROL-XL) 100 MG 24 hr tablet Take with or immediately following a meal.  ? omeprazole (PRILOSEC) 40 MG capsule TAKE 1 CAPSULE BY MOUTH EVERY DAY  ? Pancrelipase, Lip-Prot-Amyl, (CREON) 24000-76000 units CPEP Take 2 capsules (48,000 Units total) by mouth 3 (three) times daily.  ? Pitavastatin Calcium 4 MG TABS Take 4 mg by mouth daily. Study provided, drug may be placebo  ? sildenafil (VIAGRA) 25 MG tablet Take 1 tablet (25 mg total) by mouth daily as needed for erectile dysfunction.  ? Study - REPRIEVE [phone] - pitavastatin 4 mg or placebo tablet ([place]) Take one tablet by mouth once daily.  03/24/2022 L Y6063 L ?ACTG [id number]  ? Ensure (ENSURE) Take 1 Can by mouth 2 (two) times daily between meals for 30 doses. For aids wasting syndrome crhonic pancreatitis  ? feeding supplement (ENSURE IMMUNE HEALTH) LIQD Take 237 mLs by mouth 3 (three) times daily with meals. (Patient not taking: Reported on 01/22/2022)  ? [DISCONTINUED] diphenoxylate-atropine (LOMOTIL) 2.5-0.025 MG tablet TAKE 1 TABLET BY MOUTH TWICE DAILY FOR LOOSE STOOL (Patient not taking: Reported on 04/04/2021)  ? ?No facility-administered encounter medications on file as of 01/22/2022.  ? ? ?Past Medical History:  ?Diagnosis Date  ?  AIDS (HCC)   ? Anemia   ? Anxiety   ? Balance problems 03/26/2016  ? Chronic cough 04/04/2021  ? Constipation 04/04/2021  ? Dementia (HCC)   ? Depression   ? 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 HIV lipodystrophy (HCC)   ? Hyperlipidemia   ? 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 Muscle spasm 03/26/2016  ?  Pancreatitis   ? Thrombocytopenia (HCC)   ? Unintentional weight loss 06/17/2017  ? ? ?History reviewed. No pertinent surgical history. ? ?Family History  ?Problem Relation Age of Onset  ? Heart disease Mother   ? Breast cancer Mother   ? Heart disease Father   ? Mental illness Father   ? Heart attack Father   ? Heart disease Brother   ? Colon cancer Neg Hx   ? Prostate cancer Neg Hx   ? ? ?Social History  ? ?Socioeconomic History  ? Marital status: Single  ?  Spouse name: Not on file  ? Number of children: Not on file  ? Years of education: Not on file  ? Highest education level: Not on file  ?Occupational History  ? Not on file  ?Tobacco Use  ? Smoking status: Never  ? Smokeless tobacco: Never  ?Substance and Sexual Activity  ? Alcohol use: No  ?  Alcohol/week: 0.0 standard drinks  ? Drug use: No  ? Sexual activity: Yes  ?  Partners: Male  ?  Birth control/protection: Condom  ?  Comment: pt. given condoms  ?Other Topics Concern  ? Not on file  ?Social History Narrative  ? Lives with a partner. Patient is retired.   ? ?Social Determinants of Health  ? ?Financial Resource Strain: Not on file  ?Food Insecurity: Not on file  ?Transportation Needs: Not on file  ?Physical Activity: Not on file  ?Stress: Not on file  ?Social Connections: Not on file  ?Intimate Partner Violence: Not on file  ? ? ?Review of Systems  ?Constitutional: Negative.   ?Respiratory: Negative.    ?Cardiovascular: Negative.   ?Gastrointestinal:  Positive for constipation. Negative for abdominal pain, blood in stool, diarrhea, heartburn, melena, nausea and vomiting.  ?Genitourinary: Negative.   ?Psychiatric/Behavioral: Negative.    ? ?  ? ? ?Objective   ? ?BP 130/70 (BP Location: Right Arm, Patient Position: Sitting, Cuff Size: Large)   Pulse 61   Ht 5\' 8"  (1.727 m)   Wt 157 lb (71.2 kg)   SpO2 97%   BMI 23.87 kg/m?  ? ?Physical Exam ?Constitutional:   ?   General: He is not in acute distress. ?   Appearance: Normal appearance. He is not  ill-appearing, toxic-appearing or diaphoretic.  ?Cardiovascular:  ?   Rate and Rhythm: Normal rate and regular rhythm.  ?   Pulses: Normal pulses.  ?   Heart sounds: Normal heart sounds. No murmur heard. ?  No friction rub. No gallop.  ?Pulmonary:  ?   Effort: Pulmonary effort is normal. No respiratory distress.  ?   Breath sounds: Normal breath sounds. No stridor. No wheezing, rhonchi or rales.  ?Chest:  ?   Chest wall: No tenderness.  ?Abdominal:  ?   General: There is no distension.  ?   Palpations: Abdomen is soft. There is no mass.  ?   Tenderness: There is no abdominal tenderness. There is no right CVA tenderness, left CVA tenderness, guarding or rebound.  ?   Hernia: No hernia  is present.  ?Skin: ?   Capillary Refill: Capillary refill takes less than 2 seconds.  ?Neurological:  ?   Mental Status: He is alert and oriented to person, place, and time.  ?Psychiatric:     ?   Mood and Affect: Mood normal.     ?   Behavior: Behavior normal.     ?   Thought Content: Thought content normal.     ?   Judgment: Judgment normal.  ? ? ? ?  ? ?Assessment & Plan:  ? ?Problem List Items Addressed This Visit   ? ?  ? Cardiovascular and Mediastinum  ? HTN (hypertension) (Chronic)  ?  BP Readings from Last 3 Encounters:  ?01/22/22 130/70  ?06/25/21 125/69  ?04/04/21 125/74  ?Chronic condition well-controlled on metoprolol 100 mg daily ?Continue current medication ?DASH diet advised engage in regular exercises at least 150 minutes weekly ?CMP at next visit ?  ?  ?  ? Digestive  ? Pancreatic insufficiency  ?  Chronic condition well-controlled ?Continue Creon24000-76000 units ,2 capsules 3 times daily ? ?  ?  ? GERD (gastroesophageal reflux disease)  ?  On omeprazole 40 mg daily ?Continue current medication ? ?  ?  ? Relevant Medications  ? linaclotide (LINZESS) 72 MCG capsule  ?  ? Other  ? AIDS (HCC)  ?  followed by ID , has had HIV for about 20 years, currently on symtuza 800-150-200 -10mg  tablets daily.   ?Patient encouraged  to maintain close follow-up with ID he verbalized understanding,  ?  ?  ? Constipation - Primary  ?  Start Linzess 72 mcg tablets daily ?Drink plenty of water to stay hydrated increase intake of fiber ? ?  ?  ? Relevant Medications  ? linaclotide [facility]

## 2022-01-22 NOTE — Assessment & Plan Note (Signed)
Has been taking OTC Claritin ?Would like prescription for Claritin ?Claritin 10 mg daily tablets ordered ?Avoid allergens ?

## 2022-01-22 NOTE — Assessment & Plan Note (Signed)
Start Linzess 72 mcg tablets daily ?Drink plenty of water to stay hydrated increase intake of fiber ?

## 2022-02-06 ENCOUNTER — Encounter: Payer: Self-pay | Admitting: Nurse Practitioner

## 2022-02-10 ENCOUNTER — Encounter: Payer: Self-pay | Admitting: Nurse Practitioner

## 2022-02-10 ENCOUNTER — Ambulatory Visit (INDEPENDENT_AMBULATORY_CARE_PROVIDER_SITE_OTHER): Payer: Medicare Other

## 2022-02-10 DIAGNOSIS — Z0000 Encounter for general adult medical examination without abnormal findings: Secondary | ICD-10-CM

## 2022-02-10 NOTE — Progress Notes (Signed)
Subjective:   Noah Henderson is a 71 y.o. male who presents for Medicare Annual/Subsequent preventive examination.  Review of Systems    I connected with  Paralee Cancel on 02/10/22 by a audio enabled telemedicine application and verified that I am speaking with the correct person using two identifiers.  Patient Location: Home  Provider Location: Office/Clinic  I discussed the limitations of evaluation and management by telemedicine. The patient expressed understanding and agreed to proceed.  Cardiac Risk Factors include: none     Objective:    Today's Vitals   02/10/22 1503  PainSc: 0-No pain   There is no height or weight on file to calculate BMI.     02/10/2022    3:11 PM  Advanced Directives  Does Patient Have a Medical Advance Directive? Yes  Type of Paramedic of [place];Living will  Does patient want to make changes to medical advance directive? Yes (Inpatient - patient defers changing a medical advance directive at this time - Information given)    Current Medications (verified) Outpatient Encounter Medications as of 02/10/2022  Medication Sig   amitriptyline (ELAVIL) 50 MG tablet Take 1 tablet (50 mg total) by mouth at bedtime.   Darunavir-Cobicistat-Emtricitabine-Tenofovir Alafenamide (SYMTUZA) 800-150-200-10 MG TABS Take 1 tablet by mouth daily with breakfast.   feeding supplement (ENSURE IMMUNE HEALTH) LIQD Take 237 mLs by mouth 3 (three) times daily with meals.   linaclotide (LINZESS) 72 MCG capsule Take 1 capsule (72 mcg total) by mouth daily before breakfast.   loratadine (CLARITIN) 10 MG tablet Take 1 tablet (10 mg total) by mouth daily.   metoprolol succinate (TOPROL-XL) 100 MG 24 hr tablet Take with or immediately following a meal.   omeprazole (PRILOSEC) 40 MG capsule TAKE 1 CAPSULE BY MOUTH EVERY DAY   Pancrelipase, Lip-Prot-Amyl, (CREON) 24000-76000 units CPEP Take 2 capsules (48,000 Units total) by mouth 3 (three) times daily.    Pitavastatin Calcium 4 MG TABS Take 4 mg by mouth daily. Study provided, drug may be placebo   sildenafil (VIAGRA) 25 MG tablet Take 1 tablet (25 mg total) by mouth daily as needed for erectile dysfunction.   Study - REPRIEVE [phone] - pitavastatin 4 mg or placebo tablet (PI-[place]) Take one tablet by mouth once daily.  [id number] L [id number] L ACTG [id number]   Ensure (ENSURE) Take 1 Can by mouth 2 (two) times daily between meals for 30 doses. For aids wasting syndrome crhonic pancreatitis   No facility-administered encounter medications on file as of 02/10/2022.    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 No pertinent surgical history. Family History  Problem Relation Age of Onset   Heart disease Mother    Breast cancer Mother    Heart disease Father    Mental illness Father    Heart attack Father  Heart disease Brother    Colon cancer Neg Hx    Prostate cancer Neg Hx    Social History   Socioeconomic History   Marital status: Single    Spouse name: Not on file   Number of children: Not on file   Years of education: Not on file   Highest education level: Not on file  Occupational History   Not on file  Tobacco Use   Smoking status: Never   Smokeless tobacco: Never  Substance and Sexual Activity   Alcohol use: No   Drug use: No   Sexual activity: Yes     Partners: Male    Birth control/protection: Condom    Comment: pt. given condoms  Other Topics Concern   Not on file  Social History Narrative   Lives with a partner. Patient is retired.    Social Determinants of Health   Financial Resource Strain: Low Risk    Difficulty of Paying Living Expenses: Not hard at all  Food Insecurity: No Food Insecurity   Worried About Charity fundraiser in the Last Year: Never true   [place] in the Last Year: Never true  Transportation Needs: No Transportation Needs   Lack of Transportation (Medical): No   Lack of Transportation (Non-Medical): No  Physical Activity: Insufficiently Active   Days of Exercise per Week: 7 days   Minutes of Exercise per Session: 20 min  Stress: No Stress Concern Present   Feeling of Stress : Not at all  Social Connections: Moderately Integrated   Frequency of Communication with Friends and Family: Twice a week   Frequency of Social Gatherings with Friends and Family: Twice a week   Attends Religious Services: 1 to 4 times per year   Active Member of [employer] or Organizations: No   Attends Music therapist: Never   Marital Status: Living with partner    Tobacco Counseling Counseling given: Not Answered   Clinical Intake:  Pre-visit preparation completed: Yes  Pain : No/denies pain Pain Score: 0-No pain     BMI - recorded: 23.88 Nutritional Status: BMI of 19-24  Normal Nutritional Risks: None Diabetes: No  How often do you need to have someone help you when you read instructions, pamphlets, or other written materials from your doctor or pharmacy?: 1 - Never What is the last grade level you completed in school?: 12+  Diabetic?no  Interpreter Needed?: No      Activities of Daily Living    02/10/2022    3:12 PM 02/06/2022    4:12 PM  In your present state of health, do you have any difficulty performing the following activities:  Hearing? 0 0  Vision? 1 0  Difficulty  concentrating or making decisions? 0 0  Walking or climbing stairs? 0 0  Dressing or bathing? 0 0  Doing errands, shopping? 0 0  Preparing Food and eating ? N N  Using the Toilet? N N  In the past six months, have you accidently leaked urine? N N  Do you have problems with loss of bowel control? N N  Managing your Medications? N N  Managing your Finances? N N  Housekeeping or managing your Housekeeping? N N    Patient Care Team: Renee Rival, FNP as PCP - General (Nurse Practitioner) Tommy Medal, Lavell Islam, MD as Consulting Physician (Infectious Diseases)  Indicate any recent Medical Services you may have received from other than [facility] providers in the past year (date may  be approximate).     Assessment:   This is a routine wellness examination for [place].  Hearing/Vision screen No results found.  Dietary issues and exercise activities discussed: Current Exercise Habits: Home exercise routine, Type of exercise: walking, Time (Minutes): 20, Frequency (Times/Week): 7, Weekly Exercise (Minutes/Week): 140, Intensity: Mild, Exercise limited by: None identified   Goals Addressed             This Visit's Progress    Patient Stated       Stay healthy.       Depression Screen    02/10/2022    3:12 PM 02/10/2022    3:09 PM 01/22/2022   10:05 AM 04/04/2021   11:04 AM 11/15/2020   10:34 AM 06/13/2020   11:05 AM 07/18/2019   10:31 AM  PHQ 2/9 Scores  PHQ - 2 Score 0 0 0 0 0 0 0    Fall Risk    02/10/2022    3:12 PM 02/06/2022    4:12 PM 01/22/2022   10:05 AM 04/04/2021   11:04 AM 11/15/2020   10:34 AM  [place] in the past year? 0 0 0 0 0  Number falls in past yr: 0 0 0 0   Injury with Fall? 0 0 0 0   Risk for fall due to : No Fall Risks  No Fall Risks  No Fall Risks  Follow up Falls evaluation completed  Falls evaluation completed  Falls evaluation completed    [place]:  Any stairs in or around the home? Yes  If so, are  there any without handrails? Yes  Home free of loose throw rugs in walkways, pet beds, electrical cords, etc? No  Adequate lighting in your home to reduce risk of falls? No   ASSISTIVE DEVICES UTILIZED TO PREVENT FALLS:  Life alert? No  Use of a cane, walker or w/c? No  Grab bars in the bathroom? Yes  Shower chair or bench in shower? No  Elevated toilet seat or a handicapped toilet? No      02/10/2022    3:13 PM  MMSE - Mini Mental State Exam  Not completed: Unable to complete        02/10/2022    3:13 PM  6CIT Screen  What Year? 0 points  What month? 0 points  What time? 0 points  Count back from 20 0 points  Months in reverse 0 points  Repeat phrase 0 points  Total Score 0 points    Immunizations Immunization History  Administered Date(s) Administered   Fluad Quad(high Dose 65+) 07/18/2019, 06/13/2020, 10/24/2021   Hepatitis A 03/19/2011, 07/20/2012   Hepatitis B 03/19/2011, 06/09/2011, 07/20/2012, 08/23/2012   Influenza Split 06/09/2011, 07/20/2012   Influenza Whole 07/13/2010   Influenza,inj,Quad PF,6+ Mos 06/20/2013, 08/20/2015, 05/13/2016, 06/17/2017   Influenza-Unspecified 06/02/2018   Moderna Sars-Covid-2 Vaccination 12/12/2019, 01/09/2020, 08/24/2020   PNEUMOCOCCAL CONJUGATE-20 10/24/2021   Pneumococcal Conjugate-13 04/21/2018   Pneumococcal Polysaccharide-23 07/03/2008, 06/20/2013, 03/21/2019   Tdap 04/21/2018    TDAP status: Up to date  Flu Vaccine status: Up to date  Pneumococcal vaccine status: Up to date  Covid-19 vaccine status: Completed vaccines  Qualifies for Shingles Vaccine? Yes   Zostavax completed No   Shingrix Completed?: No.    Education has been provided regarding the importance of this vaccine. Patient has been advised to call insurance company to determine out of pocket expense if they have not yet received this vaccine. Advised  may also receive vaccine at local pharmacy or Health Dept. Verbalized acceptance and  understanding.  Screening Tests Health Maintenance  Topic Date Due   Zoster Vaccines- Shingrix (1 of 2) Never done   COLONOSCOPY (Pts 45-80yrs Insurance coverage will need to be confirmed)  Never done   COVID-19 Vaccine (4 - Booster for Moderna series) 02/16/2022 (Originally 10/19/2020)   INFLUENZA VACCINE  04/22/2022   TETANUS/TDAP  04/21/2028   Pneumonia Vaccine 87+ Years old  Completed   Hepatitis C Screening  Completed   HPV VACCINES  Aged Out    Health Maintenance  Health Maintenance Due  Topic Date Due   Zoster Vaccines- Shingrix (1 of 2) Never done   COLONOSCOPY (Pts 45-31yrs Insurance coverage will need to be confirmed)  Never done    Colorectal cancer screening: Referral to GI placed  . Pt aware the office will call re: appt.  Lung Cancer Screening: (Low Dose CT Chest recommended if Age 27-80 years, 30 pack-year currently smoking OR have quit w/in 15years.) does not qualify.   Lung Cancer Screening Referral: no  Additional Screening:  Hepatitis C Screening: does qualify; Completed 04/03/15  Vision Screening: Recommended annual ophthalmology exams for early detection of glaucoma and other disorders of the eye. Is the patient up to date with their annual eye exam?  No  Who is the provider or what is the name of the office in which the patient attends annual eye exams? N/a If pt is not established with a provider, would they like to be referred to a provider to establish care? No .   Dental Screening: Recommended annual dental exams for proper oral hygiene  Community Resource Referral / Chronic Care Management: CRR required this visit?  No   CCM required this visit?  No      Plan:     I have personally reviewed and noted the following in the patient's chart:   Medical and social history Use of alcohol, tobacco or illicit drugs  Current medications and supplements including opioid prescriptions. Patient is not currently taking opioid  prescriptions. Functional ability and status Nutritional status Physical activity Advanced directives List of other physicians Hospitalizations, surgeries, and ER visits in previous 12 months Vitals Screenings to include cognitive, depression, and falls Referrals and appointments  In addition, I have reviewed and discussed with patient certain preventive protocols, quality metrics, and best practice recommendations. A written personalized care plan for preventive services as well as general preventive health recommendations were provided to patient.     Quentin Angst, [place]   02/10/2022   Nurse Notes:

## 2022-02-10 NOTE — Patient Instructions (Addendum)
[name] , Thank you for taking time to come for your Medicare Wellness Visit. I appreciate your ongoing commitment to your health goals. Please review the following plan we discussed and let me know if I can assist you in the future.   Screening recommendations/referrals: Colonoscopy: Due now  Recommended yearly ophthalmology/optometry visit for glaucoma screening and checkup Recommended yearly dental visit for hygiene and checkup  Vaccinations: Influenza vaccine: Complete Pneumococcal vaccine: Complete Tdap vaccine: Complete Shingles vaccine: Due now    Advanced directives: Yes  Conditions/risks identified: Hypertension.  Next appointment: 1 year.   [name] , Thank you for taking time to come for your Medicare Wellness Visit. I appreciate your ongoing commitment to your health goals. Please review the following plan we discussed and let me know if I can assist you in the future.   These are the goals we discussed:  Goals      Patient Stated     Stay healthy.        This is a list of the screening recommended for you and due dates:  Health Maintenance  Topic Date Due   Zoster (Shingles) Vaccine (1 of 2) Never done   Colon Cancer Screening  Never done   COVID-19 Vaccine (4 - Booster for Moderna series) 02/16/2022*   Flu Shot  04/22/2022   Tetanus Vaccine  04/21/2028   Pneumonia Vaccine  Completed   Hepatitis C Screening: USPSTF Recommendation to screen - Ages 18-79 yo.  Completed   HPV Vaccine  Aged Out  *Topic was postponed. The date shown is not the original due date.    Preventive Care 69 Years and Older, Male Preventive care refers to lifestyle choices and visits with your health care provider that can promote health and wellness. What does preventive care include? A yearly physical exam. This is also called an annual well check. Dental exams once or twice a year. Routine eye exams. Ask your health care provider how often you should have your eyes  checked. Personal lifestyle choices, including: Daily care of your teeth and gums. Regular physical activity. Eating a healthy diet. Avoiding tobacco and drug use. Limiting alcohol use. Practicing safe sex. Taking low doses of aspirin every day. Taking vitamin and mineral supplements as recommended by your health care provider. What happens during an annual well check? The services and screenings done by your health care provider during your annual well check will depend on your age, overall health, lifestyle risk factors, and family history of disease. Counseling  Your health care provider may ask you questions about your: Alcohol use. Tobacco use. Drug use. Emotional well-being. Home and relationship well-being. Sexual activity. Eating habits. History of falls. Memory and ability to understand (cognition). Work and work Astronomer. Screening  You may have the following tests or measurements: Height, weight, and BMI. Blood pressure. Lipid and cholesterol levels. These may be checked every 5 years, or more frequently if you are over 69 years old. Skin check. Lung cancer screening. You may have this screening every year starting at age 69 if you have a 30-pack-year history of smoking and currently smoke or have quit within the past 15 years. Fecal occult blood test (FOBT) of the stool. You may have this test every year starting at age 69. Flexible sigmoidoscopy or colonoscopy. You may have a sigmoidoscopy every 5 years or a colonoscopy every 10 years starting at age 69. Prostate cancer screening. Recommendations will vary depending on your family history and other risks. Hepatitis C  blood test. Hepatitis B blood test. Sexually transmitted disease (STD) testing. Diabetes screening. This is done by checking your blood sugar (glucose) after you have not eaten for a while (fasting). You may have this done every 1-3 years. Abdominal aortic aneurysm (AAA) screening. You may need this  if you are a current or former smoker. Osteoporosis. You may be screened starting at age 69 if you are at high risk. Talk with your health care provider about your test results, treatment options, and if necessary, the need for more tests. Vaccines  Your health care provider may recommend certain vaccines, such as: Influenza vaccine. This is recommended every year. Tetanus, diphtheria, and acellular pertussis (Tdap, Td) vaccine. You may need a Td booster every 10 years. Zoster vaccine. You may need this after age 69. Pneumococcal 13-valent conjugate (PCV13) vaccine. One dose is recommended after age 69. Pneumococcal polysaccharide (PPSV23) vaccine. One dose is recommended after age 69. Talk to your health care provider about which screenings and vaccines you need and how often you need them. This information is not intended to replace advice given to you by your health care provider. Make sure you discuss any questions you have with your health care provider. Document Released: 10/05/2015 Document Revised: 05/28/2016 Document Reviewed: 07/10/2015 Elsevier Interactive Patient Education  2017 [employer]  Fall Prevention in the Home Falls can cause injuries. They can happen to people of all ages. There are many things you can do to make your home safe and to help prevent falls. What can I do on the outside of my home? Regularly fix the edges of walkways and driveways and fix any cracks. Remove anything that might make you trip as you walk through a door, such as a raised step or threshold. Trim any bushes or trees on the path to your home. Use bright outdoor lighting. Clear any walking paths of anything that might make someone trip, such as rocks or tools. Regularly check to see if handrails are loose or broken. Make sure that both sides of any steps have handrails. Any raised decks and porches should have guardrails on the edges. Have any leaves, snow, or ice cleared regularly. Use sand  or salt on walking paths during winter. Clean up any spills in your garage right away. This includes oil or grease spills. What can I do in the bathroom? Use night lights. Install grab bars by the toilet and in the tub and shower. Do not use towel bars as grab bars. Use non-skid mats or decals in the tub or shower. If you need to sit down in the shower, use a plastic, non-slip stool. Keep the floor dry. Clean up any water that spills on the floor as soon as it happens. Remove soap buildup in the tub or shower regularly. Attach bath mats securely with double-sided non-slip rug tape. Do not have throw rugs and other things on the floor that can make you trip. What can I do in the bedroom? Use night lights. Make sure that you have a light by your bed that is easy to reach. Do not use any sheets or blankets that are too big for your bed. They should not hang down onto the floor. Have a firm chair that has side arms. You can use this for support while you get dressed. Do not have throw rugs and other things on the floor that can make you trip. What can I do in the kitchen? Clean up any spills right away. Avoid walking on  wet floors. Keep items that you use a lot in easy-to-reach places. If you need to reach something above you, use a strong step stool that has a grab bar. Keep electrical cords out of the way. Do not use floor polish or wax that makes floors slippery. If you must use wax, use non-skid floor wax. Do not have throw rugs and other things on the floor that can make you trip. What can I do with my stairs? Do not leave any items on the stairs. Make sure that there are handrails on both sides of the stairs and use them. Fix handrails that are broken or loose. Make sure that handrails are as long as the stairways. Check any carpeting to make sure that it is firmly attached to the stairs. Fix any carpet that is loose or worn. Avoid having throw rugs at the top or bottom of the stairs.  If you do have throw rugs, attach them to the floor with carpet tape. Make sure that you have a light switch at the top of the stairs and the bottom of the stairs. If you do not have them, ask someone to add them for you. What else can I do to help prevent falls? Wear shoes that: Do not have high heels. Have rubber bottoms. Are comfortable and fit you well. Are closed at the toe. Do not wear sandals. If you use a stepladder: Make sure that it is fully opened. Do not climb a closed stepladder. Make sure that both sides of the stepladder are locked into place. Ask someone to hold it for you, if possible. Clearly mark and make sure that you can see: Any grab bars or handrails. First and last steps. Where the edge of each step is. Use tools that help you move around (mobility aids) if they are needed. These include: Canes. Walkers. Scooters. Crutches. Turn on the lights when you go into a dark area. Replace any light bulbs as soon as they burn out. Set up your furniture so you have a clear path. Avoid moving your furniture around. If any of your floors are uneven, fix them. If there are any pets around you, be aware of where they are. Review your medicines with your doctor. Some medicines can make you feel dizzy. This can increase your chance of falling. Ask your doctor what other things that you can do to help prevent falls. This information is not intended to replace advice given to you by your health care provider. Make sure you discuss any questions you have with your health care provider. Document Released: 07/05/2009 Document Revised: 02/14/2016 Document Reviewed: 10/13/2014 Elsevier Interactive Patient Education  2017 [employer].

## 2022-02-18 ENCOUNTER — Other Ambulatory Visit: Payer: Self-pay | Admitting: Nurse Practitioner

## 2022-02-18 DIAGNOSIS — G4700 Insomnia, unspecified: Secondary | ICD-10-CM

## 2022-02-18 DIAGNOSIS — K8689 Other specified diseases of pancreas: Secondary | ICD-10-CM

## 2022-02-18 DIAGNOSIS — K219 Gastro-esophageal reflux disease without esophagitis: Secondary | ICD-10-CM

## 2022-02-18 DIAGNOSIS — I10 Essential (primary) hypertension: Secondary | ICD-10-CM

## 2022-02-19 ENCOUNTER — Encounter: Payer: Medicare Other | Admitting: *Deleted

## 2022-02-20 ENCOUNTER — Other Ambulatory Visit: Payer: Self-pay | Admitting: *Deleted

## 2022-02-20 ENCOUNTER — Encounter (INDEPENDENT_AMBULATORY_CARE_PROVIDER_SITE_OTHER): Payer: Medicare Other | Admitting: *Deleted

## 2022-02-20 ENCOUNTER — Other Ambulatory Visit: Payer: Self-pay

## 2022-02-20 VITALS — BP 121/75 | HR 58 | Temp 97.9°F | Wt 157.2 lb

## 2022-02-20 DIAGNOSIS — Z006 Encounter for examination for normal comparison and control in clinical research program: Secondary | ICD-10-CM

## 2022-02-20 NOTE — Progress Notes (Signed)
Discontinued the pitavastatin/placebo

## 2022-02-20 NOTE — Progress Notes (Signed)
Discontinued med

## 2022-02-20 NOTE — Research (Signed)
Noah Henderson was here for his final visit for the Reprieve study. He forgot to bring his pills back with him today. I told him to be sure to not take anymore and bring them back in to [place]. He does have a new PCP now who has given him claritin for allergy symptoms and linzess for constipation. He says his adherence was very good and he tool his last Pitavastatin/placebo yesterday.   He is interested in doing another study, so we will keep him informed of new ones as they come up.

## 2022-03-03 ENCOUNTER — Encounter: Payer: Self-pay | Admitting: Nurse Practitioner

## 2022-03-04 ENCOUNTER — Other Ambulatory Visit: Payer: Self-pay | Admitting: Nurse Practitioner

## 2022-03-04 DIAGNOSIS — K5900 Constipation, unspecified: Secondary | ICD-10-CM

## 2022-03-04 MED ORDER — LUBIPROSTONE 24 MCG PO CAPS: 24.0000 ug | ORAL_CAPSULE | Freq: Two times a day (BID) | ORAL | 1 refills | Status: DC

## 2022-04-03 ENCOUNTER — Telehealth: Payer: Self-pay

## 2022-04-03 NOTE — Telephone Encounter (Signed)
Patient left voicemail stating he missed a call from [place]. Was not sure what the intal call was for. Per appointment note front desk is trying to reschedule appt in August.  Attempted to call patient back but not able to reach him. Left another voicemail requesting call back. Juanita Laster, RMA

## 2022-04-23 ENCOUNTER — Ambulatory Visit: Payer: Medicare Other | Admitting: Infectious Disease

## 2022-05-01 ENCOUNTER — Other Ambulatory Visit: Payer: Self-pay | Admitting: Nurse Practitioner

## 2022-05-01 DIAGNOSIS — I10 Essential (primary) hypertension: Secondary | ICD-10-CM

## 2022-05-01 DIAGNOSIS — K5900 Constipation, unspecified: Secondary | ICD-10-CM

## 2022-05-01 DIAGNOSIS — K8689 Other specified diseases of pancreas: Secondary | ICD-10-CM

## 2022-05-01 DIAGNOSIS — K219 Gastro-esophageal reflux disease without esophagitis: Secondary | ICD-10-CM

## 2022-05-01 DIAGNOSIS — G4700 Insomnia, unspecified: Secondary | ICD-10-CM

## 2022-05-14 ENCOUNTER — Other Ambulatory Visit: Payer: Self-pay | Admitting: Infectious Disease

## 2022-05-14 ENCOUNTER — Other Ambulatory Visit: Payer: Self-pay

## 2022-05-14 ENCOUNTER — Other Ambulatory Visit (HOSPITAL_COMMUNITY)
Admission: RE | Admit: 2022-05-14 | Discharge: 2022-05-14 | Disposition: A | Discharge status: Home / Self Care | Payer: Medicare Other | Source: Ambulatory Visit | Attending: Infectious Disease | Admitting: Infectious Disease

## 2022-05-14 ENCOUNTER — Encounter: Payer: Self-pay | Admitting: Infectious Disease

## 2022-05-14 ENCOUNTER — Ambulatory Visit (INDEPENDENT_AMBULATORY_CARE_PROVIDER_SITE_OTHER): Payer: Medicare Other | Admitting: Infectious Disease

## 2022-05-14 ENCOUNTER — Ambulatory Visit: Payer: Medicare Other

## 2022-05-14 VITALS — BP 130/69 | HR 52 | Temp 97.4°F | Ht 68.0 in | Wt 152.0 lb

## 2022-05-14 DIAGNOSIS — Z113 Encounter for screening for infections with a predominantly sexual mode of transmission: Secondary | ICD-10-CM

## 2022-05-14 DIAGNOSIS — E785 Hyperlipidemia, unspecified: Secondary | ICD-10-CM | POA: Insufficient documentation

## 2022-05-14 DIAGNOSIS — I10 Essential (primary) hypertension: Secondary | ICD-10-CM | POA: Diagnosis not present

## 2022-05-14 DIAGNOSIS — B20 Human immunodeficiency virus [HIV] disease: Secondary | ICD-10-CM

## 2022-05-14 DIAGNOSIS — D696 Thrombocytopenia, unspecified: Secondary | ICD-10-CM

## 2022-05-14 DIAGNOSIS — Z79899 Other long term (current) drug therapy: Secondary | ICD-10-CM | POA: Diagnosis not present

## 2022-05-14 HISTORY — DX: Hyperlipidemia, unspecified: E78.5

## 2022-05-14 MED ORDER — ATORVASTATIN CALCIUM 40 MG PO TABS: 40.0000 mg | ORAL_TABLET | Freq: Every day | ORAL | 11 refills | Status: DC

## 2022-05-14 MED ORDER — PITAVASTATIN MAGNESIUM 4 MG PO TABS
4.0000 mg | ORAL_TABLET | Freq: Every day | ORAL | 11 refills | Status: DC
Start: 2022-05-14 — End: 2022-05-14

## 2022-05-14 MED ORDER — SYMTUZA 800-150-200-10 MG PO TABS: 1.0000 | ORAL_TABLET | Freq: Every day | ORAL | 11 refills | Status: DC

## 2022-05-14 NOTE — Progress Notes (Signed)
Chief complaints: Follow-up for HIV disease on medications    Patient ID: Noah Henderson, male    DOB: [birth year], 71 y.o.   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  He is now only on Symtuza  Was in the reprieve study and reviewed the results of that today.  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  Family History  Problem Relation Age of Onset   Heart disease Mother    Breast cancer Mother    Heart disease Father    Mental illness Father    Heart attack Father    Heart disease Brother    Colon cancer Neg Hx    Prostate cancer Neg Hx       Social History   Socioeconomic History   Marital status: Single    Spouse name: Not on file   Number of children: Not on file   Years of education: Not on file   Highest education level: Not on file  Occupational History   Not on file  Tobacco Use   Smoking status: Never   Smokeless tobacco: Never  Substance and Sexual Activity   Alcohol use: No   Drug use: No   Sexual activity: Yes    Partners: Male    Birth control/protection: Condom     Comment: accepted condoms  Other Topics Concern   Not on file  Social History Narrative   Lives with a partner. Patient is retired.    Social Determinants of Health   Financial Resource Strain: Low Risk  (02/10/2022)   Overall Financial Resource Strain (CARDIA)    Difficulty of Paying Living Expenses: Not hard at all  Food Insecurity: No Food Insecurity (02/10/2022)   Hunger Vital Sign    Worried About Running Out of Food in the Last Year: Never true    Ran Out of Food in the Last Year: Never true  Transportation Needs: No Transportation Needs (02/10/2022)   PRAPARE - Administrator, Civil Service (Medical): No    Lack of Transportation (Non-Medical): No  Physical Activity: Insufficiently Active (02/10/2022)   Exercise Vital Sign    Days of Exercise per Week: 7 days    Minutes of Exercise per Session: 20 min  Stress: No Stress Concern Present (02/10/2022)   [employer] of Occupational Health - Occupational Stress Questionnaire    Feeling of Stress : Not at all  Social Connections: Moderately Integrated (02/10/2022)   Social Connection and Isolation Panel [NHANES]    Frequency of Communication with Friends  and Family: Twice a week    Frequency of Social Gatherings with Friends and Family: Twice a week    Attends Religious Services: 1 to 4 times per year    Active Member of [employer] or Organizations: No    Attends Engineer, structural: Never    Marital Status: Living with partner    Allergies  Allergen Reactions   Penicillins      Current Outpatient Medications:    amitriptyline (ELAVIL) 50 MG tablet, TAKE 1 TABLET(50 MG) BY MOUTH AT BEDTIME, Disp: 30 tablet, Rfl: 1   CREON 24000-76000 units CPEP, TAKE 2 CAPSULES BY MOUTH THREE TIMES DAILY, Disp: 180 capsule, Rfl: 1   Darunavir-Cobicistat-Emtricitabine-Tenofovir Alafenamide (SYMTUZA) 800-150-200-10 MG TABS, Take 1 tablet by mouth daily with breakfast., Disp: 30 tablet, Rfl: 11   loratadine (CLARITIN) 10 MG  tablet, Take 1 tablet (10 mg total) by mouth daily., Disp: 30 tablet, Rfl: 11   lubiprostone (AMITIZA) 24 MCG capsule, TAKE 1 CAPSULE(24 MCG) BY MOUTH TWICE DAILY WITH A MEAL, Disp: 60 capsule, Rfl: 1   metoprolol succinate (TOPROL-XL) 100 MG 24 hr tablet, TAKE 1 TABLET BY MOUTH DAILY WITH OR IMMEDIATELY FOLLOWING A MEAL, Disp: 30 tablet, Rfl: 1   omeprazole (PRILOSEC) 40 MG capsule, TAKE 1 CAPSULE BY MOUTH EVERY DAY, Disp: 30 capsule, Rfl: 1   sildenafil (VIAGRA) 25 MG tablet, Take 1 tablet (25 mg total) by mouth daily as needed for erectile dysfunction., Disp: 10 tablet, Rfl: 5   Ensure (ENSURE), Take 1 Can by mouth 2 (two) times daily between meals for 30 doses. For aids wasting syndrome crhonic pancreatitis, Disp: 7110 mL, Rfl: 5   feeding supplement (ENSURE IMMUNE HEALTH) LIQD, Take 237 mLs by mouth 3 (three) times daily with meals., Disp: 90 Bottle, Rfl: 11   Review of Systems  Constitutional:  Negative for activity change, appetite change, chills, diaphoresis, fatigue, fever and unexpected weight change.  HENT:  Negative for congestion, rhinorrhea, sinus pressure, sneezing, sore throat and trouble swallowing.   Eyes:  Negative for photophobia and visual disturbance.  Respiratory:  Negative for cough, chest tightness, shortness of breath, wheezing and stridor.   Cardiovascular:  Negative for chest pain, palpitations and leg swelling.  Gastrointestinal:  Negative for abdominal distention, abdominal pain, anal bleeding, blood in stool, constipation, diarrhea, nausea and vomiting.  Genitourinary:  Negative for difficulty urinating, dysuria, flank pain and hematuria.  Musculoskeletal:  Negative for arthralgias, back pain, gait problem, joint swelling and myalgias.  Skin:  Negative for color change, pallor, rash and wound.  Neurological:  Negative for dizziness, tremors, weakness and light-headedness.  Hematological:  Negative for adenopathy. Does not bruise/bleed easily.   Psychiatric/Behavioral:  Negative for agitation, behavioral problems, confusion, decreased concentration, dysphoric mood and sleep disturbance.        Objective:   Physical Exam Constitutional:      Appearance: He is well-developed.  HENT:     Head: Normocephalic and atraumatic.  Eyes:     Conjunctiva/sclera: Conjunctivae normal.  Cardiovascular:     Rate and Rhythm: Normal rate and regular rhythm.  Pulmonary:     Effort: Pulmonary effort is normal. No respiratory distress.     Breath sounds: No wheezing.  Abdominal:     General: There is no distension.     Palpations: Abdomen is soft.  Musculoskeletal:        General: No tenderness. Normal range of motion.     Cervical back: Normal range of motion and neck supple.  Skin:  General: Skin is warm and dry.     Coloration: Skin is not pale.     Findings: No erythema or rash.  Neurological:     Mental Status: He is alert and oriented to person, place, and time.  Psychiatric:        Mood and Affect: Mood normal.        Behavior: Behavior normal.        Thought Content: Thought content normal.        Judgment: Judgment normal.           Assessment & Plan:   HIV disease:  I am checking a viral load CD4 count CBC CMP  I am continuing SYMTUZA  Hyperlipidemia: I am prescribing pitavastatin   HTN: continue toprol   Vaccine counseling: Recommended he get RSV vaccine he also should get updated COVID and flu vaccines

## 2022-05-15 LAB — T-HELPER CELLS (CD4) COUNT (NOT AT ARMC)
CD4 % Helper T Cell: 37 % (ref 33–65)
CD4 T Cell Abs: 325 /uL — ABNORMAL LOW (ref 400–1790)

## 2022-05-15 LAB — URINE CYTOLOGY ANCILLARY ONLY
Chlamydia: NEGATIVE
Comment: NEGATIVE
Comment: NORMAL
Neisseria Gonorrhea: NEGATIVE

## 2022-05-16 ENCOUNTER — Encounter: Payer: Self-pay | Admitting: Infectious Disease

## 2022-05-17 LAB — CBC WITH DIFFERENTIAL/PLATELET
Absolute Monocytes: 413 cells/uL (ref 200–950)
Basophils Absolute: 22 cells/uL (ref 0–200)
Basophils Relative: 0.5 %
Eosinophils Absolute: 129 cells/uL (ref 15–500)
Eosinophils Relative: 3 %
HCT: 35 % — ABNORMAL LOW (ref 38.5–50.0)
Hemoglobin: 12.7 g/dL — ABNORMAL LOW (ref 13.2–17.1)
Lymphs Abs: 1006 cells/uL (ref 850–3900)
MCH: 32.9 pg (ref 27.0–33.0)
MCHC: 36.3 g/dL — ABNORMAL HIGH (ref 32.0–36.0)
MCV: 90.7 fL (ref 80.0–100.0)
MPV: 10.3 fL (ref 7.5–12.5)
Monocytes Relative: 9.6 %
Neutro Abs: 2731 cells/uL (ref 1500–7800)
Neutrophils Relative %: 63.5 %
Platelets: 86 10*3/uL — ABNORMAL LOW (ref 140–400)
RBC: 3.86 10*6/uL — ABNORMAL LOW (ref 4.20–5.80)
RDW: 13.4 % (ref 11.0–15.0)
Total Lymphocyte: 23.4 %
WBC: 4.3 10*3/uL (ref 3.8–10.8)

## 2022-05-17 LAB — COMPLETE METABOLIC PANEL WITH GFR
AG Ratio: 1.5 (calc) (ref 1.0–2.5)
ALT: 15 U/L (ref 9–46)
AST: 23 U/L (ref 10–35)
Albumin: 4.1 g/dL (ref 3.6–5.1)
Alkaline phosphatase (APISO): 73 U/L (ref 35–144)
BUN: 13 mg/dL (ref 7–25)
CO2: 24 mmol/L (ref 20–32)
Calcium: 9.4 mg/dL (ref 8.6–10.3)
Chloride: 108 mmol/L (ref 98–110)
Creat: 0.98 mg/dL (ref 0.70–1.35)
Globulin: 2.8 g/dL (calc) (ref 1.9–3.7)
Glucose, Bld: 80 mg/dL (ref 65–99)
Potassium: 4 mmol/L (ref 3.5–5.3)
Sodium: 141 mmol/L (ref 135–146)
Total Bilirubin: 0.9 mg/dL (ref 0.2–1.2)
Total Protein: 6.9 g/dL (ref 6.1–8.1)
eGFR: 83 mL/min/{1.73_m2} (ref >=60)

## 2022-05-17 LAB — LIPID PANEL
Cholesterol: 154 mg/dL (ref <200)
HDL: 35 mg/dL — ABNORMAL LOW (ref >=40)
LDL Cholesterol (Calc): 92 mg/dL (calc)
Non-HDL Cholesterol (Calc): 119 mg/dL (calc) (ref <130)
Total CHOL/HDL Ratio: 4.4 (calc) (ref <5.0)
Triglycerides: 175 mg/dL — ABNORMAL HIGH (ref <150)

## 2022-05-17 LAB — RPR: RPR Ser Ql: NONREACTIVE

## 2022-05-17 LAB — HIV-1 RNA QUANT-NO REFLEX-BLD
HIV 1 RNA Quant: <20 Copies/mL — ABNORMAL HIGH
HIV-1 RNA Quant, Log: <1.3 Log cps/mL — ABNORMAL HIGH

## 2022-05-19 ENCOUNTER — Encounter: Payer: Self-pay | Admitting: Infectious Disease

## 2022-05-20 ENCOUNTER — Encounter: Payer: Self-pay | Admitting: Infectious Disease

## 2022-05-22 ENCOUNTER — Encounter: Payer: Self-pay | Admitting: Nurse Practitioner

## 2022-05-27 ENCOUNTER — Encounter: Payer: Medicare Other | Admitting: Nurse Practitioner

## 2022-05-29 ENCOUNTER — Encounter: Payer: Medicare Other | Admitting: Internal Medicine

## 2022-06-02 ENCOUNTER — Encounter: Payer: Self-pay | Admitting: Internal Medicine

## 2022-06-05 ENCOUNTER — Encounter: Payer: Medicare Other | Admitting: Internal Medicine

## 2022-06-16 ENCOUNTER — Ambulatory Visit (INDEPENDENT_AMBULATORY_CARE_PROVIDER_SITE_OTHER): Payer: Medicare Other | Admitting: Internal Medicine

## 2022-06-16 ENCOUNTER — Encounter: Payer: Self-pay | Admitting: Internal Medicine

## 2022-06-16 VITALS — BP 128/62 | HR 58 | Ht 68.0 in | Wt 152.2 lb

## 2022-06-16 DIAGNOSIS — Z23 Encounter for immunization: Secondary | ICD-10-CM

## 2022-06-16 DIAGNOSIS — E785 Hyperlipidemia, unspecified: Secondary | ICD-10-CM

## 2022-06-16 DIAGNOSIS — M25471 Effusion, right ankle: Secondary | ICD-10-CM | POA: Diagnosis not present

## 2022-06-16 DIAGNOSIS — N529 Male erectile dysfunction, unspecified: Secondary | ICD-10-CM

## 2022-06-16 DIAGNOSIS — K219 Gastro-esophageal reflux disease without esophagitis: Secondary | ICD-10-CM

## 2022-06-16 DIAGNOSIS — K8689 Other specified diseases of pancreas: Secondary | ICD-10-CM | POA: Diagnosis not present

## 2022-06-16 DIAGNOSIS — G4700 Insomnia, unspecified: Secondary | ICD-10-CM | POA: Diagnosis not present

## 2022-06-16 DIAGNOSIS — I10 Essential (primary) hypertension: Secondary | ICD-10-CM

## 2022-06-16 DIAGNOSIS — K5900 Constipation, unspecified: Secondary | ICD-10-CM

## 2022-06-16 DIAGNOSIS — Z0001 Encounter for general adult medical examination with abnormal findings: Secondary | ICD-10-CM | POA: Diagnosis not present

## 2022-06-16 DIAGNOSIS — B20 Human immunodeficiency virus [HIV] disease: Secondary | ICD-10-CM | POA: Diagnosis not present

## 2022-06-16 DIAGNOSIS — M25473 Effusion, unspecified ankle: Secondary | ICD-10-CM | POA: Insufficient documentation

## 2022-06-16 DIAGNOSIS — Z0000 Encounter for general adult medical examination without abnormal findings: Secondary | ICD-10-CM | POA: Diagnosis not present

## 2022-06-16 HISTORY — DX: Encounter for general adult medical examination with abnormal findings: Z00.01

## 2022-06-16 MED ORDER — POLYETHYLENE GLYCOL 3350 17 G PO PACK: 17.0000 g | PACK | Freq: Every day | ORAL | 0 refills | Status: DC

## 2022-06-16 NOTE — Assessment & Plan Note (Signed)
Currently prescribed Creon.  Reports feeling well, no recent changes.  No changes today

## 2022-06-16 NOTE — Patient Instructions (Signed)
It was a pleasure to see you today.  Thank you for giving [place] the opportunity to be involved in your care.  Below is a brief recap of your visit and next steps.  We will plan to see you again in 3 months.  Summary Annual physical completed today I have prescribed miralax for your bowel regimen We will check labs today  Next steps Follow up in 3 months I will notify you of lab results

## 2022-06-16 NOTE — Assessment & Plan Note (Signed)
There is trace, nonpitting edema present around the right ankle  He has small, erythematous, oozing lesions present around both ankles that appear similar to poison oak.  He states that the onset of swelling was around the time and he noticed the lesions.  I believe this likely explains the swelling.  I encouraged supportive care measures for now including leg elevation and use of compression stockings as needed.

## 2022-06-16 NOTE — Assessment & Plan Note (Signed)
Sildenafil available for as needed use.  No changes today.

## 2022-06-16 NOTE — Assessment & Plan Note (Signed)
Followed by Dr. Tommy Medal at the [facility] for Infectious Disease.  Currently prescribed Symtuza.

## 2022-06-16 NOTE — Progress Notes (Signed)
Complete physical exam  Patient: Noah Henderson   DOB: [birth year]   71 y.o. Male  MRN: [id number]  Subjective:    Chief Complaint  Patient presents with   Annual Exam    Noah Henderson is a 71 y.o. male who presents today for a complete physical exam. He reports consuming a general diet.  The patient states that he exercises by completing yard activities around his home  He generally feels fairly well. He reports sleeping fairly well. He does have additional problems to discuss today.   Noah Henderson is a 71 year old gentleman seen today for his annual physical exam.  In addition to completing the requirements for his physical exam, Noah Henderson endorses chief complaints of chronic constipation and swelling around his ankles  He states that constipation has been an ongoing issue.  He is currently prescribed lubiprostone and has also been prescribed Linzess.  He states the medications have not been effective for him or that they cause significant diarrhea.  He also endorses recent development of swelling around his ankles.  This has occurred over a 2-3-week span, around which time he contracted poison oak while working outside.  He states that the swelling improves with elevating his legs.  He denies symptoms of orthopnea/PND and dyspnea on exertion.  Acute concerns, chronic medical issues, and outstanding preventative healthcare maintenance items discussed today individual addressed in A/P below  Most recent fall risk assessment:    06/16/2022   10:22 AM  [place] in the past year? 0  Number falls in past yr: 0  Injury with Fall? 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 No pertinent surgical history. Social History   Tobacco Use   Smoking status: Never   Smokeless tobacco: Never  Substance Use Topics   Alcohol use: No   Drug use: No   Family History  Problem Relation Age of Onset   Heart disease Mother    Breast cancer Mother    Heart disease Father    Mental illness Father    Heart attack Father    Heart disease Brother    Colon cancer Neg Hx    Prostate cancer Neg Hx    Allergies  Allergen Reactions   Penicillins       Patient Care Team: Johnette Abraham, MD as PCP - General (Internal Medicine) Tommy Medal, Lavell Islam, MD as Consulting Physician (Infectious Diseases)   Outpatient Medications Prior to Visit  Medication Sig   amitriptyline (ELAVIL) 50 MG tablet TAKE 1 TABLET(50 MG) BY MOUTH AT BEDTIME   atorvastatin (LIPITOR) 40 MG tablet Take 1 tablet (40 mg total) by mouth daily.   CREON 24000-76000 units CPEP TAKE 2 CAPSULES BY MOUTH THREE TIMES DAILY   Darunavir-Cobicistat-Emtricitabine-Tenofovir Alafenamide (SYMTUZA) 800-150-200-10 MG TABS Take 1 tablet by mouth daily with breakfast.   Ensure (ENSURE) Take 1 Can by mouth 2 (two) times daily between meals for 30 doses. For aids wasting syndrome crhonic pancreatitis   loratadine (CLARITIN) 10 MG tablet Take 1 tablet (10 mg total) by mouth daily.   metoprolol succinate (TOPROL-XL) 100 MG 24 hr tablet TAKE 1 TABLET BY MOUTH  DAILY WITH OR IMMEDIATELY FOLLOWING A MEAL   omeprazole (PRILOSEC) 40 MG capsule TAKE 1 CAPSULE BY MOUTH EVERY DAY   sildenafil (VIAGRA) 25 MG tablet Take 1 tablet (25 mg total) by mouth daily as needed for erectile dysfunction.   feeding supplement (ENSURE IMMUNE HEALTH) LIQD Take 237 mLs by mouth 3 (three) times daily with meals.   lubiprostone (AMITIZA) 24 MCG capsule TAKE 1 CAPSULE(24 MCG) BY MOUTH TWICE DAILY WITH A MEAL (Patient not taking: Reported on 06/16/2022)   No facility-administered medications prior to visit.   Review of Systems  Constitutional:  Negative for chills and fever.  HENT:  Negative for sore throat.   Respiratory:  Negative for cough and shortness of breath.   Cardiovascular:  Negative for chest pain and palpitations.  Gastrointestinal:  Positive for constipation. Negative for abdominal pain, blood in stool, diarrhea, nausea and vomiting.  Genitourinary:  Negative for dysuria and hematuria.  Musculoskeletal:  Negative for myalgias.       Bilateral ankle swelling  Skin:  Negative for itching and rash.  Neurological:  Negative for dizziness and headaches.  Psychiatric/Behavioral:  Negative for depression and suicidal ideas.       Objective:     BP 128/62   Pulse (!) 58   Ht 5' 8"  (1.727 m)   Wt 152 lb 3.2 oz (69 kg)   SpO2 96%   BMI 23.14 kg/m  BP Readings from Last 3 Encounters:  06/16/22 128/62  05/14/22 130/69  02/20/22 121/75      Physical Exam Vitals reviewed.  Constitutional:      General: He is not in acute distress.    Appearance: He is not ill-appearing.     Comments: Appears older than stated age  HENT:     Head: Normocephalic and atraumatic.     Nose: Nose normal. No congestion or rhinorrhea.     Mouth/Throat:     Mouth: Mucous membranes are moist.     Pharynx: Oropharynx is clear.     Comments: Poor dentition Eyes:     Extraocular Movements: Extraocular movements intact.     Conjunctiva/sclera: Conjunctivae normal.     Pupils:  Pupils are equal, round, and reactive to light.  Cardiovascular:     Rate and Rhythm: Normal rate and regular rhythm.     Pulses: Normal pulses.     Heart sounds: Normal heart sounds. No murmur heard. Pulmonary:     Effort: Pulmonary effort is normal.     Breath sounds: Normal breath sounds. No wheezing, rhonchi or rales.  Abdominal:     General: Abdomen is flat. Bowel sounds are normal. There is no distension.     Palpations: Abdomen is soft.     Tenderness: There is no abdominal tenderness.  Musculoskeletal:  General: No deformity. Normal range of motion.     Cervical back: Normal range of motion.     Comments: No evidence of edema around the left ankle, however there is trace nonpitting edema around the right ankle.  Multiple small, erythematous, oozing lesions are noted around each ankle.  Skin:    General: Skin is warm and dry.     Capillary Refill: Capillary refill takes less than 2 seconds.  Neurological:     General: No focal deficit present.     Mental Status: He is alert and oriented to person, place, and time.     Motor: No weakness.  Psychiatric:        Mood and Affect: Mood normal.        Behavior: Behavior normal.        Thought Content: Thought content normal.     Last CBC Lab Results  Component Value Date   WBC 4.3 05/14/2022   HGB 12.7 (L) 05/14/2022   HCT 35.0 (L) 05/14/2022   MCV 90.7 05/14/2022   MCH 32.9 05/14/2022   RDW 13.4 05/14/2022   PLT 86 (L) 62/69/4854   Last metabolic panel Lab Results  Component Value Date   GLUCOSE 80 05/14/2022   NA 141 05/14/2022   K 4.0 05/14/2022   CL 108 05/14/2022   CO2 24 05/14/2022   BUN 13 05/14/2022   CREATININE 0.98 05/14/2022   EGFR 83 05/14/2022   CALCIUM 9.4 05/14/2022   PHOS 3.1 07/05/2020   PROT 6.9 05/14/2022   ALBUMIN 3.8 03/24/2017   BILITOT 0.9 05/14/2022   ALKPHOS 75 03/24/2017   AST 23 05/14/2022   ALT 15 05/14/2022   Last lipids Lab Results  Component Value Date   CHOL 154  05/14/2022   HDL 35 (L) 05/14/2022   LDLCALC 92 05/14/2022   TRIG 175 (H) 05/14/2022   CHOLHDL 4.4 05/14/2022   Last hemoglobin A1c Lab Results  Component Value Date   HGBA1C 4.8 07/05/2020   Last thyroid functions Lab Results  Component Value Date   TSH 2.11 10/21/2018   T4TOTAL 5.7 03/26/2016   Last vitamin D No results found for: "25OHVITD2", "25OHVITD3", "VD25OH" Last vitamin B12 and Folate No results found for: "VITAMINB12", "FOLATE"      Assessment & Plan:    Routine Health Maintenance and Physical Exam  Immunization History  Administered Date(s) Administered   Fluad Quad(high Dose 65+) 07/18/2019, 06/13/2020, 10/24/2021, 06/16/2022   Hepatitis A 03/19/2011, 07/20/2012   Hepatitis B 03/19/2011, 06/09/2011, 07/20/2012, 08/23/2012   Influenza Split 06/09/2011, 07/20/2012   Influenza Whole 07/13/2010   Influenza,inj,Quad PF,6+ Mos 06/20/2013, 08/20/2015, 05/13/2016, 06/17/2017   Influenza-Unspecified 06/02/2018   Moderna Sars-Covid-2 Vaccination 12/12/2019, 01/09/2020, 08/24/2020   PNEUMOCOCCAL CONJUGATE-20 10/24/2021   Pneumococcal Conjugate-13 04/21/2018   Pneumococcal Polysaccharide-23 07/03/2008, 06/20/2013, 03/21/2019   Tdap 04/21/2018    Health Maintenance  Topic Date Due   Zoster Vaccines- Shingrix (1 of 2) Never done   COLONOSCOPY (Pts 45-43yr Insurance coverage will need to be confirmed)  Never done   COVID-19 Vaccine (4 - Moderna risk series) 10/19/2020   TETANUS/TDAP  04/21/2028   Pneumonia Vaccine 71 Years old  Completed   INFLUENZA VACCINE  Completed   Hepatitis C Screening  Completed   HPV VACCINES  Aged Out    Discussed health benefits of physical activity, and encouraged him to engage in regular exercise appropriate for his age and condition.  Problem List Items Addressed This Visit       Cardiovascular  and Mediastinum   HTN (hypertension) (Chronic)    BP 128/62.  Well-controlled on metoprolol.  No changes today.        Digestive    Pancreatic insufficiency    Currently prescribed Creon.  Reports feeling well, no recent changes.  No changes today      GERD (gastroesophageal reflux disease)    Symptoms well controlled with omeprazole.  No changes today.        Other   Human immunodeficiency virus (HIV) disease ([place])    Followed by Dr. Tommy Medal at the [facility] for [facility].  Currently prescribed Symtuza.      Insomnia    Symptoms currently well controlled with Elavil.  No changes today      Erectile dysfunction    Sildenafil available for as needed use.  No changes today.      Constipation    Chronic issue.  He is currently prescribed lubiprostone and Linzess.  He is not currently taking a stool softener or laxative. -Start MiraLAX 17 g packet daily -Continue lubiprostone -Encouraged appropriate hydration      Hyperlipidemia    Recently switched from pitavastatin to atorvastatin 40 mg daily.  Lipid panel updated last month. -No changes today.  Continue atorvastatin      Encounter for annual general medical examination with abnormal findings in adult    Presenting today for his annual exam -Recent labs reviewed and additional labs ordered -Colorectal cancer screening remains outstanding.  He states that he has a Cologuard kit at home that he needs to complete and return. -Flu shot administered today -I recommended that he receive the RSV, COVID-19, and Shingrix vaccines at his pharmacy      Ankle swelling    There is trace, nonpitting edema present around the right ankle  He has small, erythematous, oozing lesions present around both ankles that appear similar to poison oak.  He states that the onset of swelling was around the time and he noticed the lesions.  I believe this likely explains the swelling.  I encouraged supportive care measures for now including leg elevation and use of compression stockings as needed.      Return in about 3 months (around 09/15/2022).     Johnette Abraham, MD

## 2022-06-16 NOTE — Assessment & Plan Note (Signed)
Symptoms well-controlled with omeprazole.  No changes today. 

## 2022-06-16 NOTE — Assessment & Plan Note (Signed)
Chronic issue.  He is currently prescribed lubiprostone and Linzess.  He is not currently taking a stool softener or laxative. -Start MiraLAX 17 g packet daily -Continue lubiprostone -Encouraged appropriate hydration

## 2022-06-16 NOTE — Assessment & Plan Note (Signed)
BP 128/62.  Well-controlled on metoprolol.  No changes today.

## 2022-06-16 NOTE — Assessment & Plan Note (Signed)
Presenting today for his annual exam -Recent labs reviewed and additional labs ordered -Colorectal cancer screening remains outstanding.  He states that he has a Cologuard kit at home that he needs to complete and return. -Flu shot administered today -I recommended that he receive the RSV, COVID-19, and Shingrix vaccines at his pharmacy

## 2022-06-16 NOTE — Assessment & Plan Note (Signed)
Symptoms currently well controlled with Elavil.  No changes today

## 2022-06-16 NOTE — Assessment & Plan Note (Signed)
Recently switched from pitavastatin to atorvastatin 40 mg daily.  Lipid panel updated last month. -No changes today.  Continue atorvastatin

## 2022-06-17 LAB — BASIC METABOLIC PANEL
BUN/Creatinine Ratio: 14 (ref 10–24)
BUN: 13 mg/dL (ref 8–27)
CO2: 19 mmol/L — ABNORMAL LOW (ref 20–29)
Calcium: 8.8 mg/dL (ref 8.6–10.2)
Chloride: 103 mmol/L (ref 96–106)
Creatinine, Ser: 0.96 mg/dL (ref 0.76–1.27)
Glucose: 98 mg/dL (ref 70–99)
Potassium: 4 mmol/L (ref 3.5–5.2)
Sodium: 138 mmol/L (ref 134–144)
eGFR: 86 mL/min/{1.73_m2} (ref >59)

## 2022-06-17 LAB — TSH+FREE T4
Free T4: 1.02 ng/dL (ref 0.82–1.77)
TSH: 2.66 u[IU]/mL (ref 0.450–4.500)

## 2022-06-17 LAB — B12 AND FOLATE PANEL
Folate: 3.4 ng/mL (ref >3.0)
Vitamin B-12: 724 pg/mL (ref 232–1245)

## 2022-06-17 LAB — HEMOGLOBIN A1C
Est. average glucose Bld gHb Est-mCnc: 100 mg/dL
Hgb A1c MFr Bld: 5.1 % (ref 4.8–5.6)

## 2022-06-17 LAB — VITAMIN D 25 HYDROXY (VIT D DEFICIENCY, FRACTURES): Vit D, 25-Hydroxy: 30.2 ng/mL (ref 30.0–100.0)

## 2022-06-30 ENCOUNTER — Other Ambulatory Visit: Payer: Self-pay | Admitting: Nurse Practitioner

## 2022-06-30 DIAGNOSIS — I10 Essential (primary) hypertension: Secondary | ICD-10-CM

## 2022-06-30 DIAGNOSIS — G4700 Insomnia, unspecified: Secondary | ICD-10-CM

## 2022-06-30 DIAGNOSIS — K8689 Other specified diseases of pancreas: Secondary | ICD-10-CM

## 2022-06-30 DIAGNOSIS — K219 Gastro-esophageal reflux disease without esophagitis: Secondary | ICD-10-CM

## 2022-08-30 ENCOUNTER — Other Ambulatory Visit: Payer: Self-pay | Admitting: Internal Medicine

## 2022-08-30 DIAGNOSIS — K219 Gastro-esophageal reflux disease without esophagitis: Secondary | ICD-10-CM

## 2022-08-30 DIAGNOSIS — G4700 Insomnia, unspecified: Secondary | ICD-10-CM

## 2022-08-30 DIAGNOSIS — K8689 Other specified diseases of pancreas: Secondary | ICD-10-CM

## 2022-08-30 DIAGNOSIS — I10 Essential (primary) hypertension: Secondary | ICD-10-CM

## 2022-09-16 ENCOUNTER — Encounter: Payer: Self-pay | Admitting: Internal Medicine

## 2022-09-17 ENCOUNTER — Encounter: Payer: Self-pay | Admitting: Internal Medicine

## 2022-09-18 ENCOUNTER — Ambulatory Visit: Payer: Medicare Other | Admitting: Internal Medicine

## 2022-09-18 NOTE — Telephone Encounter (Signed)
Appt cancelled . Note added

## 2022-10-16 ENCOUNTER — Ambulatory Visit: Payer: Medicare Other | Admitting: Internal Medicine

## 2022-11-05 ENCOUNTER — Other Ambulatory Visit: Payer: Self-pay | Admitting: Internal Medicine

## 2022-11-05 DIAGNOSIS — K219 Gastro-esophageal reflux disease without esophagitis: Secondary | ICD-10-CM

## 2022-11-05 DIAGNOSIS — I10 Essential (primary) hypertension: Secondary | ICD-10-CM

## 2022-11-05 DIAGNOSIS — G4700 Insomnia, unspecified: Secondary | ICD-10-CM

## 2022-11-05 DIAGNOSIS — K8689 Other specified diseases of pancreas: Secondary | ICD-10-CM

## 2022-11-11 ENCOUNTER — Other Ambulatory Visit (HOSPITAL_COMMUNITY)
Admission: RE | Admit: 2022-11-11 | Discharge: 2022-11-11 | Disposition: A | Discharge status: Home / Self Care | Payer: Medicare Other | Source: Ambulatory Visit | Attending: Infectious Disease | Admitting: Infectious Disease

## 2022-11-11 ENCOUNTER — Encounter: Payer: Self-pay | Admitting: Infectious Disease

## 2022-11-11 ENCOUNTER — Other Ambulatory Visit: Payer: Self-pay

## 2022-11-11 ENCOUNTER — Ambulatory Visit (INDEPENDENT_AMBULATORY_CARE_PROVIDER_SITE_OTHER): Payer: Medicare Other | Admitting: Infectious Disease

## 2022-11-11 VITALS — BP 152/78 | HR 68 | Temp 98.2°F | Ht 68.0 in | Wt 160.2 lb

## 2022-11-11 DIAGNOSIS — G969 Disorder of central nervous system, unspecified: Secondary | ICD-10-CM | POA: Diagnosis not present

## 2022-11-11 DIAGNOSIS — E785 Hyperlipidemia, unspecified: Secondary | ICD-10-CM

## 2022-11-11 DIAGNOSIS — B20 Human immunodeficiency virus [HIV] disease: Secondary | ICD-10-CM | POA: Insufficient documentation

## 2022-11-11 DIAGNOSIS — K8689 Other specified diseases of pancreas: Secondary | ICD-10-CM

## 2022-11-11 DIAGNOSIS — Z7185 Encounter for immunization safety counseling: Secondary | ICD-10-CM | POA: Diagnosis not present

## 2022-11-11 MED ORDER — SYMTUZA 800-150-200-10 MG PO TABS: 1.0000 | ORAL_TABLET | Freq: Every day | ORAL | 11 refills | Status: DC

## 2022-11-11 MED ORDER — ATORVASTATIN CALCIUM 40 MG PO TABS: 40.0000 mg | ORAL_TABLET | Freq: Every day | ORAL | 11 refills | Status: DC

## 2022-11-11 NOTE — Progress Notes (Signed)
Chief complaints: Follow-up for HIV disease on medications    Patient ID: Noah Henderson, male    DOB: December 06, 1950, 72 y.o.   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  Family History  Problem Relation Age of Onset   Heart disease Mother    Breast cancer Mother    Heart disease Father    Mental illness Father    Heart attack Father    Heart disease Brother    Colon cancer Neg Hx    Prostate cancer Neg Hx       Social History   Socioeconomic History   Marital status: Single    Spouse name: Not on file   Number of children: Not on file   Years of education: Not on file   Highest education level: Not on file  Occupational History   Not on file  Tobacco Use   Smoking status: Never   Smokeless tobacco: Never  Substance and Sexual Activity   Alcohol use: No   Drug use: No   Sexual activity: Yes    Partners: Male    Birth control/protection: Condom     Comment: accepted condoms  Other Topics Concern   Not on file  Social History Narrative   Lives with a partner. Patient is retired.    Social Determinants of Health   Financial Resource Strain: Low Risk  (02/10/2022)   Overall Financial Resource Strain (CARDIA)    Difficulty of Paying Living Expenses: Not hard at all  Food Insecurity: No Food Insecurity (02/10/2022)   Hunger Vital Sign    Worried About Running Out of Food in the Last Year: Never true    Ran Out of Food in the Last Year: Never true  Transportation Needs: No Transportation Needs (02/10/2022)   PRAPARE - Hydrologist (Medical): No    Lack of Transportation (Non-Medical): No  Physical Activity: Insufficiently Active (02/10/2022)   Exercise Vital Sign    Days of Exercise per Week: 7 days    Minutes of Exercise per Session: 20 min  Stress: No Stress Concern Present (02/10/2022)   [place]    Feeling of Stress : Not at all  Social Connections: Moderately Integrated (02/10/2022)   Social Connection and Isolation Panel [NHANES]    Frequency of Communication with Friends and Family:  Twice a week    Frequency of Social Gatherings with Friends and Family: Twice a week    Attends Religious Services: 1 to 4 times per year    Active Member of [employer] or Organizations: No    Attends Music therapist: Never    Marital Status: Living with partner    Allergies  Allergen Reactions   Penicillins      Current Outpatient Medications:    amitriptyline (ELAVIL) 50 MG tablet, TAKE 1 TABLET(50 MG) BY MOUTH AT BEDTIME, Disp: 30 tablet, Rfl: 1   atorvastatin (LIPITOR) 40 MG tablet, Take 1 tablet (40 mg total) by mouth daily., Disp: 30 tablet, Rfl: 11   CREON 24000-76000 units CPEP, TAKE 2 CAPSULES BY MOUTH THREE TIMES DAILY, Disp: 180 capsule, Rfl: 1   Darunavir-Cobicistat-Emtricitabine-Tenofovir Alafenamide (SYMTUZA) 800-150-200-10  MG TABS, Take 1 tablet by mouth daily with breakfast., Disp: 30 tablet, Rfl: 11   loratadine (CLARITIN) 10 MG tablet, Take 1 tablet (10 mg total) by mouth daily., Disp: 30 tablet, Rfl: 11   metoprolol succinate (TOPROL-XL) 100 MG 24 hr tablet, TAKE 1 TABLET BY MOUTH DAILY WITH OR IMMEDIATELY FOLLOWING A MEAL, Disp: 30 tablet, Rfl: 1   omeprazole (PRILOSEC) 40 MG capsule, TAKE 1 CAPSULE BY MOUTH EVERY DAY, Disp: 30 capsule, Rfl: 1   polyethylene glycol (MIRALAX) 17 g packet, Take 17 g by mouth daily., Disp: 14 each, Rfl: 0   sildenafil (VIAGRA) 25 MG tablet, Take 1 tablet (25 mg total) by mouth daily as needed for erectile dysfunction., Disp: 10 tablet, Rfl: 5   Ensure (ENSURE), Take 1 Can by mouth 2 (two) times daily between meals for 30 doses. For aids wasting syndrome crhonic pancreatitis, Disp: 7110 mL, Rfl: 5   lubiprostone (AMITIZA) 24 MCG capsule, TAKE 1 CAPSULE(24 MCG) BY MOUTH TWICE DAILY WITH A MEAL (Patient not taking: Reported on 06/16/2022), Disp: 60 capsule, Rfl: 1   Review of Systems  Constitutional:  Negative for activity change, appetite change, chills, diaphoresis, fatigue, fever and unexpected weight change.  HENT:  Negative for congestion, rhinorrhea, sinus pressure, sneezing, sore throat and trouble swallowing.   Eyes:  Negative for photophobia and visual disturbance.  Respiratory:  Negative for cough, chest tightness, shortness of breath, wheezing and stridor.   Cardiovascular:  Negative for chest pain, palpitations and leg swelling.  Gastrointestinal:  Negative for abdominal distention, abdominal pain, anal bleeding, blood in stool, constipation, diarrhea, nausea and vomiting.  Genitourinary:  Negative for difficulty urinating, dysuria, flank pain and hematuria.  Musculoskeletal:  Negative for arthralgias, back pain, gait problem, joint swelling and myalgias.  Skin:  Negative for color change, pallor, rash and wound.  Neurological:  Negative for dizziness, tremors, weakness  and light-headedness.  Hematological:  Negative for adenopathy. Does not bruise/bleed easily.  Psychiatric/Behavioral:  Negative for agitation, behavioral problems, confusion, decreased concentration, dysphoric mood and sleep disturbance.        Objective:   Physical Exam Constitutional:      Appearance: He is well-developed.  HENT:     Head: Normocephalic and atraumatic.  Eyes:     Conjunctiva/sclera: Conjunctivae normal.  Cardiovascular:     Rate and Rhythm: Normal rate and regular rhythm.  Pulmonary:     Effort: Pulmonary effort is normal. No respiratory distress.     Breath sounds: No wheezing.  Abdominal:     General: There is no distension.     Palpations: Abdomen is soft.  Musculoskeletal:        General: No  tenderness. Normal range of motion.     Cervical back: Normal range of motion and neck supple.  Skin:    General: Skin is warm and dry.     Coloration: Skin is not pale.     Findings: No erythema or rash.  Neurological:     General: No focal deficit present.     Mental Status: He is alert and oriented to person, place, and time.  Psychiatric:        Mood and Affect: Mood normal.        Behavior: Behavior normal.        Thought Content: Thought content normal.        Judgment: Judgment normal.           Assessment & Plan:   HIV disease:  I will add order HIV viral load CD4 count CBC with differential CMP, RPR GC and chlamydia and I will continue  Noah Henderson's  SYMTUZA prescription   Hyperlipidemia: I am continues his Lipitor HTN: continue toprol  Pancreatic insufficiency: He will continue on Creon  Vaccine counseling: Recommended updated COVID-19 vaccine and RSV.  He would like to get the [facility] version of the former so he will get these at the pharmacy

## 2022-11-12 LAB — URINE CYTOLOGY ANCILLARY ONLY
Chlamydia: NEGATIVE
Comment: NEGATIVE
Comment: NORMAL
Neisseria Gonorrhea: NEGATIVE

## 2022-11-12 LAB — T-HELPER CELLS (CD4) COUNT (NOT AT ARMC)
CD4 % Helper T Cell: 36 % (ref 33–65)
CD4 T Cell Abs: 270 /uL — ABNORMAL LOW (ref 400–1790)

## 2022-11-13 LAB — COMPLETE METABOLIC PANEL WITH GFR
AG Ratio: 1.6 (calc) (ref 1.0–2.5)
ALT: 20 U/L (ref 9–46)
AST: 28 U/L (ref 10–35)
Albumin: 4.1 g/dL (ref 3.6–5.1)
Alkaline phosphatase (APISO): 83 U/L (ref 35–144)
BUN: 8 mg/dL (ref 7–25)
CO2: 25 mmol/L (ref 20–32)
Calcium: 8.8 mg/dL (ref 8.6–10.3)
Chloride: 107 mmol/L (ref 98–110)
Creat: 0.89 mg/dL (ref 0.70–1.35)
Globulin: 2.5 g/dL (calc) (ref 1.9–3.7)
Glucose, Bld: 90 mg/dL (ref 65–99)
Potassium: 3.4 mmol/L — ABNORMAL LOW (ref 3.5–5.3)
Sodium: 140 mmol/L (ref 135–146)
Total Bilirubin: 1.4 mg/dL — ABNORMAL HIGH (ref 0.2–1.2)
Total Protein: 6.6 g/dL (ref 6.1–8.1)
eGFR: 93 mL/min/{1.73_m2} (ref >=60)

## 2022-11-13 LAB — CBC WITH DIFFERENTIAL/PLATELET
Absolute Monocytes: 302 cells/uL (ref 200–950)
Basophils Absolute: 21 cells/uL (ref 0–200)
Basophils Relative: 0.5 %
Eosinophils Absolute: 151 cells/uL (ref 15–500)
Eosinophils Relative: 3.6 %
HCT: 33.7 % — ABNORMAL LOW (ref 38.5–50.0)
Hemoglobin: 12 g/dL — ABNORMAL LOW (ref 13.2–17.1)
Lymphs Abs: 874 cells/uL (ref 850–3900)
MCH: 30.8 pg (ref 27.0–33.0)
MCHC: 35.6 g/dL (ref 32.0–36.0)
MCV: 86.4 fL (ref 80.0–100.0)
MPV: 10.9 fL (ref 7.5–12.5)
Monocytes Relative: 7.2 %
Neutro Abs: 2852 cells/uL (ref 1500–7800)
Neutrophils Relative %: 67.9 %
Platelets: 73 10*3/uL — ABNORMAL LOW (ref 140–400)
RBC: 3.9 10*6/uL — ABNORMAL LOW (ref 4.20–5.80)
RDW: 14.3 % (ref 11.0–15.0)
Total Lymphocyte: 20.8 %
WBC: 4.2 10*3/uL (ref 3.8–10.8)

## 2022-11-13 LAB — LIPID PANEL
Cholesterol: 107 mg/dL (ref <200)
HDL: 41 mg/dL (ref >=40)
LDL Cholesterol (Calc): 47 mg/dL (calc)
Non-HDL Cholesterol (Calc): 66 mg/dL (calc) (ref <130)
Total CHOL/HDL Ratio: 2.6 (calc) (ref <5.0)
Triglycerides: 109 mg/dL (ref <150)

## 2022-11-13 LAB — HIV-1 RNA QUANT-NO REFLEX-BLD
HIV 1 RNA Quant: <20 Copies/mL — ABNORMAL HIGH
HIV-1 RNA Quant, Log: <1.3 Log cps/mL — ABNORMAL HIGH

## 2022-11-13 LAB — RPR: RPR Ser Ql: NONREACTIVE

## 2022-11-14 ENCOUNTER — Encounter: Payer: Self-pay | Admitting: Internal Medicine

## 2022-11-14 ENCOUNTER — Ambulatory Visit (INDEPENDENT_AMBULATORY_CARE_PROVIDER_SITE_OTHER): Payer: Medicare Other | Admitting: Internal Medicine

## 2022-11-14 VITALS — BP 116/68 | HR 78 | Ht 68.0 in | Wt 165.2 lb

## 2022-11-14 DIAGNOSIS — R600 Localized edema: Secondary | ICD-10-CM

## 2022-11-14 DIAGNOSIS — K5904 Chronic idiopathic constipation: Secondary | ICD-10-CM | POA: Diagnosis not present

## 2022-11-14 DIAGNOSIS — M25473 Effusion, unspecified ankle: Secondary | ICD-10-CM | POA: Diagnosis not present

## 2022-11-14 MED ORDER — FUROSEMIDE 20 MG PO TABS: 20.0000 mg | ORAL_TABLET | Freq: Every day | ORAL | 3 refills | Status: DC | PRN

## 2022-11-14 NOTE — Progress Notes (Unsigned)
Established Patient Office Visit  Subjective   Patient ID: Noah Henderson, male    DOB: 09-01-1951  Age: 72 y.o. MRN: [id number]  Chief Complaint  Patient presents with   Hyperlipidemia    Follow up   Mr. Franc returns to care today for follow-up.  He was last seen by me on 06/16/22 for his annual exam.  At that time he endorsed chronic constipation.  MiraLAX was added to his daily regimen.  No additional medication changes were made.  In the interim he has been seen by infectious disease (Dr. Tommy Medal) for follow-up earlier this week.  There have otherwise been no acute interval events.  Mr. Malburg continues to endorse chronic constipation.  He states that he has 1-2 liquid, watery stools daily but is unable to completely empty his bowels.  He continues to endorse bilateral lower extremity edema as well.  He has no additional concerns to discuss today.  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 No pertinent surgical history. Social History   Tobacco Use   Smoking status: Never   Smokeless tobacco: Never  Substance Use Topics   Alcohol use: No   Drug use: No   Family History  Problem Relation Age of Onset   Heart  disease Mother    Breast cancer Mother    Heart disease Father    Mental illness Father    Heart attack Father    Heart disease Brother    Colon cancer Neg Hx    Prostate cancer Neg Hx    Allergies  Allergen Reactions   Penicillins    Review of Systems  Cardiovascular:  Positive for leg swelling.  Gastrointestinal:  Positive for constipation.  All other systems reviewed and are negative.    Objective:     BP 116/68   Pulse 78   Ht '5\' 8"'$  (1.727 m)   Wt 165 lb 3.2 oz (74.9 kg)   SpO2 95%   BMI 25.12 kg/m  BP Readings from Last 3 Encounters:  11/14/22 116/68  11/11/22 (!) 152/78  06/16/22 128/62   Physical Exam Vitals reviewed.  Constitutional:      General: He is not in acute distress.    Appearance: Normal appearance. He is not ill-appearing.  HENT:     Head: Normocephalic and atraumatic.     Right Ear: External ear normal.     Left Ear: External ear normal.     Nose: Nose normal. No congestion or rhinorrhea.     Mouth/Throat:     Mouth: Mucous membranes are moist.     Pharynx: Oropharynx is clear.  Eyes:     General: No scleral icterus.    Extraocular  Movements: Extraocular movements intact.     Conjunctiva/sclera: Conjunctivae normal.     Pupils: Pupils are equal, round, and reactive to light.  Cardiovascular:     Rate and Rhythm: Normal rate and regular rhythm.     Pulses: Normal pulses.     Heart sounds: Normal heart sounds. No murmur heard. Pulmonary:     Effort: Pulmonary effort is normal.     Breath sounds: Normal breath sounds. No wheezing, rhonchi or rales.  Abdominal:     General: Abdomen is flat. Bowel sounds are normal. There is no distension.     Palpations: Abdomen is soft.     Tenderness: There is no abdominal tenderness.  Musculoskeletal:        General: No swelling or deformity. Normal range of motion.     Cervical back: Normal range of motion.     Right lower leg: Edema present.     Left lower leg: Edema present.  Skin:    General:  Skin is warm and dry.     Capillary Refill: Capillary refill takes less than 2 seconds.  Neurological:     General: No focal deficit present.     Mental Status: He is alert and oriented to person, place, and time.     Motor: No weakness.  Psychiatric:        Mood and Affect: Mood normal.        Behavior: Behavior normal.        Thought Content: Thought content normal.   Last CBC Lab Results  Component Value Date   WBC 4.2 11/11/2022   HGB 12.0 (L) 11/11/2022   HCT 33.7 (L) 11/11/2022   MCV 86.4 11/11/2022   MCH 30.8 11/11/2022   RDW 14.3 11/11/2022   PLT 73 (L) 0000000   Last metabolic panel Lab Results  Component Value Date   GLUCOSE 90 11/11/2022   NA 140 11/11/2022   K 3.4 (L) 11/11/2022   CL 107 11/11/2022   CO2 25 11/11/2022   BUN 8 11/11/2022   CREATININE 0.89 11/11/2022   EGFR 93 11/11/2022   CALCIUM 8.8 11/11/2022   PHOS 3.1 07/05/2020   PROT 6.6 11/11/2022   ALBUMIN 3.8 03/24/2017   BILITOT 1.4 (H) 11/11/2022   ALKPHOS 75 03/24/2017   AST 28 11/11/2022   ALT 20 11/11/2022   Last lipids Lab Results  Component Value Date   CHOL 107 11/11/2022   HDL 41 11/11/2022   LDLCALC 47 11/11/2022   TRIG 109 11/11/2022   CHOLHDL 2.6 11/11/2022   Last hemoglobin A1c Lab Results  Component Value Date   HGBA1C 5.1 06/16/2022   Last thyroid functions Lab Results  Component Value Date   TSH 2.660 06/16/2022   T4TOTAL 5.7 03/26/2016   Last vitamin D Lab Results  Component Value Date   VD25OH 30.2 06/16/2022   Last vitamin B12 and Folate Lab Results  Component Value Date   VITAMINB12 724 06/16/2022   FOLATE 3.4 06/16/2022     Assessment & Plan:   Problem List Items Addressed This Visit       Chronic idiopathic constipation - Primary    He continues to endorse chronic constipation today.  Currently prescribed lubiprostone, Linzess, and MiraLAX was added at his last appointment.  He described his 1-2 watery bowel movements daily but is unable to  have formed stools. -Given the persistence of his symptoms, I placed referral to gastroenterology for further evaluation and management      Lower extremity edema  Trace bilateral lower extreme edema is present on exam again today, mostly around his ankles.  Suspect chronic venous insufficiency.  Denies orthopnea/PND as well as dyspnea on exertion.  Otherwise appears euvolemic on exam today. -Lasix 20 mg as needed prescribed       Return in about 6 months (around 05/15/2023).    Johnette Abraham, MD

## 2022-11-14 NOTE — Patient Instructions (Signed)
It was a pleasure to see you today.  Thank you for giving [place] the opportunity to be involved in your care.  Below is a brief recap of your visit and next steps.  We will plan to see you again in 6 months.  Summary Add lasix 20 mg as needed for swelling in your legs I have placed a referral to gastroenterology (Dr. Abbey Chatters) for constipation

## 2022-11-17 ENCOUNTER — Encounter: Payer: Self-pay | Admitting: Internal Medicine

## 2022-11-20 DIAGNOSIS — R600 Localized edema: Secondary | ICD-10-CM | POA: Insufficient documentation

## 2022-11-20 NOTE — Assessment & Plan Note (Signed)
Trace bilateral lower extreme edema is present on exam again today.  Suspect chronic venous insufficiency.  Denies orthopnea/PND as well as dyspnea on exertion.  Otherwise appears euvolemic on exam today. -Lasix 20 mg as needed prescribed

## 2022-11-20 NOTE — Assessment & Plan Note (Signed)
He continues to endorse chronic constipation today.  Currently prescribed lubiprostone, Linzess, and MiraLAX was added at his last appointment.  He described his 1-2 watery bowel movements daily but is unable to have formed stools. -Given the persistence of his symptoms, I placed referral to gastroenterology for further evaluation and management

## 2022-12-10 ENCOUNTER — Ambulatory Visit (HOSPITAL_COMMUNITY)
Admission: RE | Admit: 2022-12-10 | Discharge: 2022-12-10 | Disposition: A | Discharge status: Home / Self Care | Payer: Medicare Other | Source: Ambulatory Visit | Attending: Internal Medicine | Admitting: Internal Medicine

## 2022-12-10 ENCOUNTER — Encounter: Payer: Self-pay | Admitting: Internal Medicine

## 2022-12-10 ENCOUNTER — Ambulatory Visit (INDEPENDENT_AMBULATORY_CARE_PROVIDER_SITE_OTHER): Payer: Medicare Other | Admitting: Internal Medicine

## 2022-12-10 VITALS — BP 125/70 | HR 63 | Temp 97.6°F | Ht 68.0 in | Wt 163.4 lb

## 2022-12-10 DIAGNOSIS — K581 Irritable bowel syndrome with constipation: Secondary | ICD-10-CM

## 2022-12-10 DIAGNOSIS — R1030 Lower abdominal pain, unspecified: Secondary | ICD-10-CM

## 2022-12-10 DIAGNOSIS — K219 Gastro-esophageal reflux disease without esophagitis: Secondary | ICD-10-CM | POA: Diagnosis not present

## 2022-12-10 DIAGNOSIS — Z1211 Encounter for screening for malignant neoplasm of colon: Secondary | ICD-10-CM | POA: Diagnosis not present

## 2022-12-10 DIAGNOSIS — K5900 Constipation, unspecified: Secondary | ICD-10-CM | POA: Diagnosis not present

## 2022-12-10 DIAGNOSIS — R109 Unspecified abdominal pain: Secondary | ICD-10-CM | POA: Diagnosis not present

## 2022-12-10 DIAGNOSIS — K8689 Other specified diseases of pancreas: Secondary | ICD-10-CM

## 2022-12-10 NOTE — Progress Notes (Signed)
Primary Care Physician:  Johnette Abraham, MD Primary Gastroenterologist:  Dr. Abbey Chatters  Chief Complaint  Patient presents with   Constipation    Referred for constipation. Has tried amitiza 24 mcg, miralax, metamucil, stool softners, and laxatives. Has small amounts of stool that are liquid.     HPI:   Noah Henderson is a 72 y.o. male who presents to clinic today by referral from his PCP Dr. Doren Custard for evaluation for chronic constipation.  Patient states he was in his normal state of health until approximately 9 months ago when he started having severe constipation.  States he has not had a full bowel movement in 6 months.  Notes 2-3 liquid bowel movements a day.  Has tried Amitiza, MiraLAX, Metamucil, stool softeners and laxatives, Linzess without improvement in his symptoms.  Notes associated abdominal bloating and pain.  No melena hematochezia.  States he had a colonoscopy years ago.  Has Cologuard test at home but unable to produce stool sample to send.  Abdominal pain is mild to moderate, constant, full/bloating feeling.  Also has chronic reflux which is well-controlled omeprazole daily.  No dysphagia odynophagia.  No epigastric or chest pain.  Also with history of pancreatic insufficiency currently on Creon.  Doing well.  History of HIV which is well-controlled.  Follows with infectious disease.  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  Current Outpatient Medications  Medication Sig Dispense Refill   amitriptyline (ELAVIL) 50 MG tablet TAKE 1 TABLET(50 MG) BY MOUTH AT BEDTIME 30 tablet 1   atorvastatin (LIPITOR) 40 MG tablet Take 1 tablet (40 mg total) by mouth daily. 30 tablet 11   CREON 24000-76000 units CPEP TAKE 2 CAPSULES BY MOUTH THREE TIMES DAILY 180 capsule 1   Darunavir-Cobicistat-Emtricitabine-Tenofovir Alafenamide (SYMTUZA) 800-150-200-10 MG TABS Take 1 tablet by mouth daily with breakfast. 30 tablet 11   Ensure (ENSURE) Take 1 Can by mouth 2 (two) times daily between meals for 30 doses. For aids wasting syndrome crhonic pancreatitis 7110 mL 5   furosemide (LASIX) 20 MG tablet Take 1 tablet (20 mg total) by mouth daily as needed for edema (lower extremity edema). 30 tablet 3   loratadine (CLARITIN) 10 MG tablet Take 1 tablet (10 mg total) by mouth daily. 30 tablet 11   metoprolol succinate (TOPROL-XL) 100 MG 24 hr tablet TAKE 1 TABLET BY MOUTH DAILY WITH OR IMMEDIATELY FOLLOWING A MEAL 30 tablet 1   omeprazole (PRILOSEC) 40 MG capsule TAKE 1 CAPSULE BY MOUTH EVERY DAY 30 capsule 1   sildenafil (VIAGRA) 25 MG tablet Take 1 tablet (25 mg total) by mouth daily as needed for erectile dysfunction. 10 tablet  5   No current facility-administered medications for this visit.    Allergies as of 12/10/2022 - Review Complete 12/10/2022  Allergen Reaction Noted   Penicillins      Family History  Problem Relation Age of Onset   Heart disease Mother    Breast cancer Mother    Heart disease Father    Mental illness Father    Heart attack Father    Heart disease Brother    Colon cancer Neg Hx    Prostate cancer Neg Hx     Social History   Socioeconomic History   Marital  status: Single    Spouse name: Not on file   Number of children: Not on file   Years of education: Not on file   Highest education level: Not on file  Occupational History   Not on file  Tobacco Use   Smoking status: Never    Passive exposure: Never   Smokeless tobacco: Never  Substance and Sexual Activity   Alcohol use: No   Drug use: No   Sexual activity: Yes    Partners: Male    Birth control/protection: Condom    Comment: accepted condoms  Other Topics Concern   Not on file  Social History Narrative   Lives with a partner. Patient is retired.    Social Determinants of Health   Financial Resource Strain: Low Risk  (02/10/2022)   Overall Financial Resource Strain (CARDIA)    Difficulty of Paying Living Expenses: Not hard at all  Food Insecurity: No Food Insecurity (02/10/2022)   Hunger Vital Sign    Worried About Running Out of Food in the Last Year: Never true    Ran Out of Food in the Last Year: Never true  Transportation Needs: No Transportation Needs (02/10/2022)   PRAPARE - Hydrologist (Medical): No    Lack of Transportation (Non-Medical): No  Physical Activity: Insufficiently Active (02/10/2022)   Exercise Vital Sign    Days of Exercise per Week: 7 days    Minutes of Exercise per Session: 20 min  Stress: No Stress Concern Present (02/10/2022)   [place]    Feeling of Stress : Not at all  Social Connections: Moderately Integrated (02/10/2022)   Social Connection and Isolation Panel [NHANES]    Frequency of Communication with Friends and Family: Twice a week    Frequency of Social Gatherings with Friends and Family: Twice a week    Attends Religious Services: 1 to 4 times per year    Active Member of [employer] or Organizations: No    Attends Archivist Meetings: Never    Marital Status: Living with partner  Intimate Partner Violence: Not At Risk (02/10/2022)    Humiliation, Afraid, Rape, and Kick questionnaire    Fear of Current or Ex-Partner: No    Emotionally Abused: No    Physically Abused: No    Sexually Abused: No    Subjective: Review of Systems  Constitutional:  Negative for chills and fever.  HENT:  Negative for congestion and hearing loss.   Eyes:  Negative for blurred vision and double vision.  Respiratory:  Negative for cough and shortness of breath.   Cardiovascular:  Negative for chest pain and palpitations.  Gastrointestinal:  Positive for abdominal pain, constipation and heartburn. Negative for blood in stool, diarrhea, melena and vomiting.  Genitourinary:  Negative for dysuria and urgency.  Musculoskeletal:  Negative for joint pain and  myalgias.  Skin:  Negative for itching and rash.  Neurological:  Negative for dizziness and headaches.  Psychiatric/Behavioral:  Negative for depression. The patient is not nervous/anxious.        Objective: BP 125/70   Pulse 63   Temp 97.6 F (36.4 C) (Oral)   Ht 5\' 8"  (1.727 m)   Wt 163 lb 6.4 oz (74.1 kg)   BMI 24.84 kg/m  Physical Exam Constitutional:      Appearance: Normal appearance.  HENT:     Head: Normocephalic and atraumatic.  Eyes:     Extraocular Movements: Extraocular movements intact.     Conjunctiva/sclera: Conjunctivae normal.  Cardiovascular:     Rate and Rhythm: Normal rate and regular rhythm.  Pulmonary:     Effort: Pulmonary effort is normal.     Breath sounds: Normal breath sounds.  Abdominal:     General: Bowel sounds are normal. There is distension.     Palpations: Abdomen is soft.  Musculoskeletal:        General: Normal range of motion.     Cervical back: Normal range of motion and neck supple.  Skin:    General: Skin is warm.  Neurological:     General: No focal deficit present.     Mental Status: He is alert and oriented to person, place, and time.  Psychiatric:        Mood and Affect: Mood normal.        Behavior: Behavior normal.       Assessment: *Chronic GERD-well-controlled omeprazole daily *Pancreatic insufficiency-well-controlled on Creon *Irritable bowel syndrome with constipation-severe *Abdominal pain related to IBS  Plan: Chronic GERD well-controlled omeprazole daily.  Will continue  Continue Creon for pancreatic insufficiency.  Patient has severe constipation related to irritable bowel syndrome.  I am going to order KUB to further evaluate today.  Concern for possible stool impaction.  Pending KUB findings may need enemas as well as MiraLAX purge.  Once his colon is adequately cleaned out then we can retry medication such as Linzess.  Further recommendations after KUB evaluated.  Thank you Dr. Doren Custard for the kind referral 12/10/2022 2:42 PM   Disclaimer: This note was dictated with voice recognition software. Similar sounding words can inadvertently be transcribed and may not be corrected upon review.

## 2022-12-10 NOTE — Patient Instructions (Addendum)
I am going to order abdominal x-ray at [facility] to further evaluate your constipation and abdominal pain.  Once I reviewed this I will call with further recommendations.  Recommend completing Cologuard testing when you are able to have a bowel movement.  It was very nice meeting you today.  Dr. Abbey Chatters

## 2022-12-10 NOTE — Progress Notes (Deleted)
Primary Care Physician:  Johnette Abraham, MD Primary Gastroenterologist:  Dr. Abbey Chatters  Chief Complaint  Patient presents with   Constipation    Referred for constipation. Has tried amitiza 24 mcg, miralax, metamucil, stool softners, and laxatives. Has small amounts of stool that are liquid.     HPI:   Noah Henderson is a 72 y.o. 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  Current Outpatient Medications  Medication Sig Dispense Refill   amitriptyline (ELAVIL) 50 MG tablet TAKE 1 TABLET(50 MG) BY MOUTH AT BEDTIME 30 tablet 1   atorvastatin (LIPITOR) 40 MG tablet Take 1 tablet (40 mg total) by mouth daily. 30 tablet 11   CREON 24000-76000 units CPEP TAKE 2 CAPSULES BY MOUTH THREE TIMES DAILY 180 capsule 1   Darunavir-Cobicistat-Emtricitabine-Tenofovir Alafenamide (SYMTUZA) 800-150-200-10 MG TABS Take 1 tablet by mouth daily with breakfast. 30 tablet 11   Ensure (ENSURE) Take 1 Can by mouth 2 (two) times daily between meals for 30 doses. For aids wasting syndrome crhonic pancreatitis 7110 mL 5   furosemide (LASIX) 20 MG tablet Take 1 tablet (20 mg total) by mouth daily as needed  for edema (lower extremity edema). 30 tablet 3   loratadine (CLARITIN) 10 MG tablet Take 1 tablet (10 mg total) by mouth daily. 30 tablet 11   metoprolol succinate (TOPROL-XL) 100 MG 24 hr tablet TAKE 1 TABLET BY MOUTH DAILY WITH OR IMMEDIATELY FOLLOWING A MEAL 30 tablet 1   omeprazole (PRILOSEC) 40 MG capsule TAKE 1 CAPSULE BY MOUTH EVERY DAY 30 capsule 1   sildenafil (VIAGRA) 25 MG tablet Take 1 tablet (25 mg total) by mouth daily as needed for erectile dysfunction. 10 tablet 5   No current facility-administered medications for this visit.    Allergies as of 12/10/2022 - Review Complete 12/10/2022  Allergen Reaction Noted   Penicillins      Family History  Problem Relation Age of Onset   Heart disease Mother    Breast cancer Mother    Heart disease Father    Mental illness Father    Heart attack Father    Heart disease Brother    Colon cancer Neg Hx    Prostate cancer Neg Hx     Social History   Socioeconomic History   Marital status: Single    Spouse name: Not on file   Number of children: Not on file   Years of education: Not on file   Highest education level: Not on file  Occupational History   Not on file  Tobacco Use   Smoking status: Never    Passive exposure: Never  Smokeless tobacco: Never  Substance and Sexual Activity   Alcohol use: No   Drug use: No   Sexual activity: Yes    Partners: Male    Birth control/protection: Condom    Comment: accepted condoms  Other Topics Concern   Not on file  Social History Narrative   Lives with a partner. Patient is retired.    Social Determinants of Health   Financial Resource Strain: Low Risk  (02/10/2022)   Overall Financial Resource Strain (CARDIA)    Difficulty of Paying Living Expenses: Not hard at all  Food Insecurity: No Food Insecurity (02/10/2022)   Hunger Vital Sign    Worried About Running Out of Food in the Last Year: Never true    Ran Out of Food in the Last Year: Never true  Transportation Needs:  No Transportation Needs (02/10/2022)   PRAPARE - Hydrologist (Medical): No    Lack of Transportation (Non-Medical): No  Physical Activity: Insufficiently Active (02/10/2022)   Exercise Vital Sign    Days of Exercise per Week: 7 days    Minutes of Exercise per Session: 20 min  Stress: No Stress Concern Present (02/10/2022)   [place]    Feeling of Stress : Not at all  Social Connections: Moderately Integrated (02/10/2022)   Social Connection and Isolation Panel [NHANES]    Frequency of Communication with Friends and Family: Twice a week    Frequency of Social Gatherings with Friends and Family: Twice a week    Attends Religious Services: 1 to 4 times per year    Active Member of [employer] or Organizations: No    Attends Archivist Meetings: Never    Marital Status: Living with partner  Intimate Partner Violence: Not At Risk (02/10/2022)   Humiliation, Afraid, Rape, and Kick questionnaire    Fear of Current or Ex-Partner: No    Emotionally Abused: No    Physically Abused: No    Sexually Abused: No    Subjective: Review of Systems  Constitutional:  Negative for chills and fever.  HENT:  Negative for congestion and hearing loss.   Eyes:  Negative for blurred vision and double vision.  Respiratory:  Negative for cough and shortness of breath.   Cardiovascular:  Negative for chest pain and palpitations.  Gastrointestinal:  Positive for constipation. Negative for abdominal pain, blood in stool, diarrhea, heartburn, melena and vomiting.  Genitourinary:  Negative for dysuria and urgency.  Musculoskeletal:  Negative for joint pain and myalgias.  Skin:  Negative for itching and rash.  Neurological:  Negative for dizziness and headaches.  Psychiatric/Behavioral:  Negative for depression. The patient is not nervous/anxious.        Objective: BP 125/70   Pulse 63   Temp 97.6 F (36.4 C)  (Oral)   Ht 5\' 8"  (1.727 m)   Wt 163 lb 6.4 oz (74.1 kg)   BMI 24.84 kg/m  Physical Exam Constitutional:      Appearance: Normal appearance.  HENT:     Head: Normocephalic and atraumatic.  Eyes:     Extraocular Movements: Extraocular movements intact.     Conjunctiva/sclera: Conjunctivae normal.  Cardiovascular:     Rate and Rhythm: Normal rate and regular rhythm.  Pulmonary:     Effort: Pulmonary effort is normal.     Breath sounds: Normal breath sounds.  Abdominal:     General: Bowel sounds are normal.     Palpations: Abdomen  is soft.  Musculoskeletal:        General: Normal range of motion.     Cervical back: Normal range of motion and neck supple.  Skin:    General: Skin is warm.  Neurological:     General: No focal deficit present.     Mental Status: He is alert and oriented to person, place, and time.  Psychiatric:        Mood and Affect: Mood normal.        Behavior: Behavior normal.      Assessment: *  Plan:   12/10/2022 2:44 PM   Disclaimer: This note was dictated with voice recognition software. Similar sounding words can inadvertently be transcribed and may not be corrected upon review.

## 2022-12-11 ENCOUNTER — Encounter: Payer: Self-pay | Admitting: Internal Medicine

## 2022-12-15 ENCOUNTER — Encounter: Payer: Self-pay | Admitting: Internal Medicine

## 2022-12-23 ENCOUNTER — Other Ambulatory Visit: Payer: Self-pay | Admitting: Internal Medicine

## 2022-12-23 DIAGNOSIS — K8689 Other specified diseases of pancreas: Secondary | ICD-10-CM

## 2022-12-23 DIAGNOSIS — K219 Gastro-esophageal reflux disease without esophagitis: Secondary | ICD-10-CM

## 2022-12-23 DIAGNOSIS — G4700 Insomnia, unspecified: Secondary | ICD-10-CM

## 2023-01-06 NOTE — Telephone Encounter (Signed)
Please see pt mychart. Thanks!

## 2023-01-06 NOTE — Telephone Encounter (Signed)
Noted  Phoned and LMOVM for the pt to look at his MyChart message and I would call back to check with him regarding this.

## 2023-01-07 ENCOUNTER — Other Ambulatory Visit: Payer: Self-pay | Admitting: Internal Medicine

## 2023-01-07 DIAGNOSIS — I10 Essential (primary) hypertension: Secondary | ICD-10-CM

## 2023-01-07 NOTE — Telephone Encounter (Signed)
Pt did see his MyChart message from [place] on 01/07/2023.

## 2023-01-10 NOTE — Progress Notes (Signed)
Referring Provider: Billie Lade, MD Primary Care Physician:  Billie Lade, MD Primary GI Physician: Dr. Marletta Lor  Chief Complaint  Patient presents with   Follow-up    Follow up, constipation still.      HPI:   Noah Henderson is a 72 y.o. male presenting today for follow-up.   Patient was last seen in our office 12/10/2022 at the time of initial consult reporting being in his normal state of health until about 9 months ago when he started having severe constipation.  Reported he had not had a full bowel movement in 6 months.  Having 2-3 liquid bowel movements per day.  Tried Amitiza, MiraLAX, Metamucil, stool softeners and laxatives, Linzess without improvement.  Associated abdominal bloating and pain.  Reported colonoscopy years ago.  Also with chronic reflux that was well-controlled on omeprazole daily and history of pancreatic insufficiency on Creon.  Plan for KUB to rule out stool impaction.  Abdominal x-ray with possible right renal stones.  He was given instructions for a MiraLAX bowel purge and advised to start Linzess 290 mcg after he was cleaned out.   Today: Had some liquid BMs with Miralax prep. Had several. No brbpr or melena. No solid stools in months. Has 1-3 liquid Bms daily. Doesn't pass much. Never feels like he is ever empty. Gained 15 lbs. Feels like his upper abdomen is swollen. No abdominal pain, nausea, or vomiting. Chronic reflux well controlled. Appetite is good. No dysphagia.   Last colonoscopy with [facility] many years ago.   Swelling in legs started the last 2 months. Started lasix. Taking once a day for the last week or 2. Was prn.   Alcohol: Never Illicit drug use: Never   Not taking folate.   Evidence of cirrhosis on ultrasound in February 2020 as well as gallbladder polyp. Hepatitis B core antibody positive in October 2021.  Hepatitis B surface antigen and surface antibody negative.Can't remember if he was treated.   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 No pertinent surgical history.  Current Outpatient Medications  Medication Sig Dispense Refill   amitriptyline (ELAVIL) 50 MG tablet TAKE 1 TABLET(50 MG) BY MOUTH AT BEDTIME 30 tablet 1   atorvastatin (LIPITOR) 40 MG tablet Take 1 tablet (40 mg total) by mouth daily. 30 tablet 11   Darunavir-Cobicistat-Emtricitabine-Tenofovir Alafenamide (SYMTUZA) 800-150-200-10 MG TABS Take 1 tablet by mouth daily with breakfast. 30 tablet 11   Ensure (ENSURE) Take 1 Can by mouth 2 (two) times daily between meals for 30 doses. For aids wasting syndrome crhonic pancreatitis 7110 mL 5   furosemide (LASIX) 20 MG tablet Take 1 tablet (20 mg total) by mouth daily  as needed for edema (lower extremity edema). 30 tablet 3   loratadine (CLARITIN) 10 MG tablet Take 1 tablet (10 mg total) by mouth daily. 30 tablet 11   metoprolol succinate (TOPROL-XL) 100 MG 24 hr tablet TAKE 1 TABLET BY MOUTH DAILY WITH OR IMMEDIATELY FOLLOWING A MEAL 30 tablet 1   omeprazole (PRILOSEC) 40 MG capsule TAKE 1 CAPSULE BY MOUTH EVERY DAY 30 capsule 1   Pancrelipase, Lip-Prot-Amyl, (CREON) 24000-76000 units CPEP TAKE 2 CAPSULES BY MOUTH THREE TIMES  DAILY 100 capsule 0   sildenafil (VIAGRA) 25 MG tablet Take 1 tablet (25 mg total) by mouth daily as needed for erectile dysfunction. 10 tablet 5   No current facility-administered medications for this visit.    Allergies as of 01/12/2023 - Review Complete 01/12/2023  Allergen Reaction Noted   Penicillins      Family History  Problem Relation Age of Onset   Heart disease Mother    Breast cancer Mother    Heart disease Father    Mental illness Father    Heart attack Father    Heart disease Brother    Colon cancer Neg Hx    Prostate cancer Neg Hx     Social History   Socioeconomic History   Marital status: Single    Spouse name: Not on file   Number of children: Not on file   Years of education: Not on file   Highest education level: Not on file  Occupational History   Not on file  Tobacco Use   Smoking status: Never    Passive exposure: Never   Smokeless tobacco: Never  Substance and Sexual Activity   Alcohol use: No   Drug use: No   Sexual activity: Yes    Partners: Male    Birth control/protection: Condom    Comment: accepted condoms  Other Topics Concern   Not on file  Social History Narrative   Lives with a partner. Patient is retired.    Social Determinants of Health   Financial Resource Strain: Low Risk  (02/10/2022)   Overall Financial Resource Strain (CARDIA)    Difficulty of Paying Living Expenses: Not hard at all  Food Insecurity: No Food Insecurity (02/10/2022)   Hunger Vital Sign    Worried About Running Out of Food in the Last Year: Never true    Ran Out of Food in the Last Year: Never true  Transportation Needs: No Transportation Needs (02/10/2022)   PRAPARE - Administrator, Civil Service (Medical): No    Lack of Transportation (Non-Medical): No  Physical Activity: Insufficiently Active (02/10/2022)   Exercise Vital Sign    Days of Exercise per Week: 7 days    Minutes of Exercise per Session: 20 min  Stress: No Stress Concern  Present (02/10/2022)   [employer] of Occupational Health - Occupational Stress Questionnaire    Feeling of Stress : Not at all  Social Connections: Moderately Integrated (02/10/2022)   Social Connection and Isolation Panel [NHANES]    Frequency of Communication with Friends and Family: Twice a week    Frequency of Social Gatherings with Friends and Family: Twice a week    Attends Religious Services: 1 to 4 times per year    Active Member of [employer] or Organizations: No    Attends Banker Meetings: Never    Marital Status: Living with partner    Review of Systems: Gen: Denies fever, chills, cold or flulike symptoms, presyncope, syncope. CV: Denies chest pain,  palpitations. Resp: Denies dyspnea, cough. GI: See HPI Derm: Denies rash. Psych: Denies depression, anxiety. Heme: See HPI  Physical Exam: BP (!) 131/59 (BP Location: Right Arm, Patient Position: Sitting, Cuff Size: Normal)   Pulse 60   Temp 98.6 F (37 C) (Temporal)   Ht 5\' 8"  (1.727 m)   Wt 165 lb 12.8 oz (75.2 kg)   SpO2 97%   BMI 25.21 kg/m  General:   Alert and oriented. No distress noted. Pleasant and cooperative.  Head:  Normocephalic and atraumatic. Eyes:  Conjuctiva clear without scleral icterus. Heart:  S1, S2 present without murmurs appreciated. Lungs:  Clear to auscultation bilaterally. No wheezes, rales, or rhonchi. No distress.  Abdomen:  +BS, soft, non-tender and non-distended. No rebound or guarding. No HSM or masses noted. Msk:  Symmetrical without gross deformities. Normal posture. Extremities:  Without edema. Neurologic:  Alert and  oriented x4 Psych:  Normal mood and affect.    Assessment:  72 year old male with history of HIV/AIDS, anxiety, depression, HTN, HLD, neuropathy, thrombocytopenia, cirrhosis, presenting today for further evaluation of change in bowel habits/constipation.  Also discussed cirrhosis and anemia.  Change in bowel habits/constipation: Patient reports  significant change in bowel habits around June 2023 when he began to have severe constipation.  Only having 1-3 small volume liquid bowel movements daily with constant sensation of incomplete bowel emptying.  Associated upper abdominal bloating, but no abdominal pain.  No unintentional weight loss, BRBPR, or melena.  He has tried numerous medications including Amitiza, MiraLAX, Metamucil, stool softeners, over-the-counter laxatives, Linzess without improvement.  He did have several liquid bowel movements with a MiraLAX bowel prep.  Last colonoscopy many years ago at [facility] per patient.  No record of this in Care Everywhere.  He has had mild decline in his hemoglobin over the last 1-1/2-2 years with most recent hemoglobin 12.0 on 11/11/2022.   Etiology of change in bowel habits is not clear.  As patient is not responding typically to medical management and his last colonoscopy was several years ago, I recommended updating a colonoscopy at this time.  Advised to take MiraLAX twice a day to help with bowel function.   Anemia; Mild decline in hemoglobin over the last 1-1/2-2 years with most recent hemoglobin 12.0 on 11/11/2022.  Baseline previously 13-14.  Folate previously borderline low at 3.4 in September 2023.  B12 normal.  No iron panel on file.  Denies overt GI bleeding.  Will plan to recheck hemoglobin, iron panel, and folate.  Cirrhosis: Patient denies any knowledge of having cirrhosis.  Per chart review, ultrasound in February 2020 with evidence of cirrhosis. Hepatitis B core antibody was positive in October 2021, surface antigen and surface antibody were negative.  Patient cannot remember if he was ever treated for hepatitis B.  Denies ever drinking alcohol or using illicit drugs.  Clinically, patient seems fairly well compensated.  He does report some swelling in his legs that started the last couple of months, taking Lasix 20 mg daily which is helping.  No other symptoms of decompensation.   Unable to calculate MELD as he has no recent INR on file.  Most recent labs in February with total bilirubin slightly elevated at 1.4, LFTs within normal limits, platelets low at 73. Etiology of cirrhosis is not known at this time.   At this time, we will plan to update ultrasound for [facility] screening, arrange first-ever EGD for variceal screening, obtain labs to evaluate etiology of cirrhosis including viral hepatitis and immunity, hemochromatosis,  autoimmune hepatitis, primary biliary cholangitis, Wilson's disease, alpha-1 antitrypsin deficiency.  Will also update basic labs including CBC, CMP, INR to calculate MELD, fractionate bilirubin due to prior elevation  Gallbladder polyp: Small gallbladder polyp noted on ultrasound in February 2020.  Will update ultrasound now to reevaluate.    Plan:  CBC, CMP, bilirubin fractionated, INR, ANA, AMA, ASMA, immunoglobulins, ceruloplasmin, alpha-1 antitrypsin phenotype, hepatitis A antibody total, hepatitis B surface antibody, hepatitis B surface antigen, hepatitis B core antibody, hepatitis B DNA, hepatitis C antibody, folate, iron panel. Ultrasound abdomen complete. Proceed with upper endoscopy + colonoscopy with propofol by Dr. Marletta Lor in near future. The risks, benefits, and alternatives have been discussed with the patient in detail. The patient states understanding and desires to proceed.  ASA 3 MiraLAX 17 g twice daily. Continue Lasix 20 mg daily. Strict 2 g/day sodium diet restriction. High-protein diet from a primarily plant-based diet. Avoid red meat.  No raw or undercooked meat, seafood, or shellfish. Low-fat/cholesterol/carbohydrate diet. Recommend at least 30 minutes of aerobic and resistance exercise 3 days/week. Follow-up after procedures.   Ermalinda Memos, PA-C [facility] 01/12/2023

## 2023-01-12 ENCOUNTER — Telehealth: Payer: Self-pay | Admitting: *Deleted

## 2023-01-12 ENCOUNTER — Ambulatory Visit (INDEPENDENT_AMBULATORY_CARE_PROVIDER_SITE_OTHER): Payer: Medicare Other | Admitting: Gastroenterology

## 2023-01-12 ENCOUNTER — Encounter: Payer: Self-pay | Admitting: Gastroenterology

## 2023-01-12 VITALS — BP 131/59 | HR 60 | Temp 98.6°F | Ht 68.0 in | Wt 165.8 lb

## 2023-01-12 DIAGNOSIS — K7460 Unspecified cirrhosis of liver: Secondary | ICD-10-CM | POA: Diagnosis not present

## 2023-01-12 DIAGNOSIS — R194 Change in bowel habit: Secondary | ICD-10-CM

## 2023-01-12 DIAGNOSIS — D649 Anemia, unspecified: Secondary | ICD-10-CM | POA: Diagnosis not present

## 2023-01-12 DIAGNOSIS — R768 Other specified abnormal immunological findings in serum: Secondary | ICD-10-CM | POA: Diagnosis not present

## 2023-01-12 DIAGNOSIS — K5900 Constipation, unspecified: Secondary | ICD-10-CM

## 2023-01-12 DIAGNOSIS — K824 Cholesterolosis of gallbladder: Secondary | ICD-10-CM

## 2023-01-12 NOTE — Telephone Encounter (Signed)
LMOVM to call back to give [place] appt scheduled for 5/3, arrival 8:15am, npo midnight.  Also needs to schedule TCS/EGD with Dr. Marletta Lor, ASA 3, 2 days of clears, extra 1/2 bowel prep

## 2023-01-12 NOTE — Patient Instructions (Addendum)
Please have blood work completed at [facility].  We will arrange you to have an ultrasound at [facility].  We will arrange for you to have a colonoscopy and upper endoscopy in the near future with Dr. Marletta Lor.  For constipation, take MiraLAX 17 g twice daily in 8 ounces of water or other noncarbonated beverage of your choice.   Continue taking Lasix 20 mg daily for now.  Follow a strict low-sodium diet.  No more than 2000 mg of sodium per day including everything that you eat and drink.  Nutrition recommendations:  High-protein diet from a primarily plant-based diet. Avoid red meat.  No raw or undercooked meat, seafood, or shellfish. Low-fat/cholesterol/carbohydrate diet. Recommend at least 30 minutes of aerobic and resistance exercise 3 days/week.  Will follow-up with you in the office after your procedures.  We will be in contact with you with your labs and ultrasound results.  It was very nice to meet you today!  Ermalinda Memos, PA-C [facility]

## 2023-01-13 ENCOUNTER — Encounter: Payer: Self-pay | Admitting: Internal Medicine

## 2023-01-13 LAB — CBC WITH DIFFERENTIAL/PLATELET
MPV: 11.2 fL (ref 7.5–12.5)
Neutro Abs: 3243 cells/uL (ref 1500–7800)
Neutrophils Relative %: 69 %
RDW: 14 % (ref 11.0–15.0)

## 2023-01-13 LAB — COMPLETE METABOLIC PANEL WITH GFR
Globulin: 2.6 g/dL (calc) (ref 1.9–3.7)
Total Bilirubin: 1.5 mg/dL — ABNORMAL HIGH (ref 0.2–1.2)

## 2023-01-13 LAB — BILIRUBIN, FRACTIONATED(TOT/DIR/INDIR)
Bilirubin, Direct: 0.4 mg/dL — ABNORMAL HIGH (ref 0.0–0.2)
Indirect Bilirubin: 1.1 mg/dL (calc) (ref 0.2–1.2)

## 2023-01-13 NOTE — Telephone Encounter (Signed)
Pt left vm returning call from yesterday  [facility]

## 2023-01-14 LAB — CBC WITH DIFFERENTIAL/PLATELET
Eosinophils Absolute: 122 cells/uL (ref 15–500)
Eosinophils Relative: 2.6 %
MCHC: 34.7 g/dL (ref 32.0–36.0)

## 2023-01-14 LAB — PROTIME-INR: Prothrombin Time: 12.1 s — ABNORMAL HIGH (ref 9.0–11.5)

## 2023-01-14 LAB — COMPLETE METABOLIC PANEL WITH GFR
AG Ratio: 1.6 (calc) (ref 1.0–2.5)
AST: 35 U/L (ref 10–35)
Alkaline phosphatase (APISO): 82 U/L (ref 35–144)
Chloride: 106 mmol/L (ref 98–110)
eGFR: 94 mL/min/{1.73_m2} (ref >=60)

## 2023-01-14 LAB — FOLATE: Folate: 7.5 ng/mL

## 2023-01-14 NOTE — Telephone Encounter (Signed)
Pt left vm returning call  LMTRC 

## 2023-01-15 ENCOUNTER — Encounter: Payer: Self-pay | Admitting: *Deleted

## 2023-01-15 LAB — CBC WITH DIFFERENTIAL/PLATELET
Hemoglobin: 11.7 g/dL — ABNORMAL LOW (ref 13.2–17.1)
MCH: 29.8 pg (ref 27.0–33.0)
MCV: 86 fL (ref 80.0–100.0)

## 2023-01-15 LAB — BILIRUBIN, FRACTIONATED(TOT/DIR/INDIR): Total Bilirubin: 1.5 mg/dL — ABNORMAL HIGH (ref 0.2–1.2)

## 2023-01-15 LAB — HEPATITIS B CORE ANTIBODY, TOTAL: Hep B Core Total Ab: REACTIVE — AB

## 2023-01-16 LAB — CBC WITH DIFFERENTIAL/PLATELET
Lymphs Abs: 874 cells/uL (ref 850–3900)
WBC: 4.7 10*3/uL (ref 3.8–10.8)

## 2023-01-16 LAB — COMPLETE METABOLIC PANEL WITH GFR: Potassium: 3.9 mmol/L (ref 3.5–5.3)

## 2023-01-16 LAB — HEPATITIS B DNA, ULTRAQUANTITATIVE, PCR: Hepatitis B virus DNA: NOT DETECTED Log IU/mL

## 2023-01-17 LAB — CBC WITH DIFFERENTIAL/PLATELET
Basophils Relative: 0.6 %
Platelets: 76 10*3/uL — ABNORMAL LOW (ref 140–400)

## 2023-01-17 LAB — COMPLETE METABOLIC PANEL WITH GFR
CO2: 25 mmol/L (ref 20–32)
Glucose, Bld: 85 mg/dL (ref 65–99)

## 2023-01-18 ENCOUNTER — Encounter: Payer: Self-pay | Admitting: Gastroenterology

## 2023-01-18 DIAGNOSIS — K824 Cholesterolosis of gallbladder: Secondary | ICD-10-CM | POA: Insufficient documentation

## 2023-01-19 NOTE — Telephone Encounter (Signed)
Pt left vm returning call  LMTRC 

## 2023-01-20 LAB — CERULOPLASMIN: Ceruloplasmin: 38 mg/dL — ABNORMAL HIGH (ref 18–36)

## 2023-01-20 LAB — ANA: Anti Nuclear Antibody (ANA): NEGATIVE

## 2023-01-20 LAB — CBC WITH DIFFERENTIAL/PLATELET
Absolute Monocytes: 432 cells/uL (ref 200–950)
Basophils Absolute: 28 cells/uL (ref 0–200)
HCT: 33.7 % — ABNORMAL LOW (ref 38.5–50.0)
Monocytes Relative: 9.2 %
RBC: 3.92 10*6/uL — ABNORMAL LOW (ref 4.20–5.80)
Total Lymphocyte: 18.6 %

## 2023-01-20 LAB — IRON,TIBC AND FERRITIN PANEL
%SAT: 40 % (calc) (ref 20–48)
Ferritin: 19 ng/mL — ABNORMAL LOW (ref 24–380)
Iron: 170 ug/dL (ref 50–180)
TIBC: 422 mcg/dL (calc) (ref 250–425)

## 2023-01-20 LAB — ALPHA-1 ANTITRYPSIN PHENOTYPE: A-1 Antitrypsin, Ser: 148 mg/dL (ref 83–199)

## 2023-01-20 LAB — COMPLETE METABOLIC PANEL WITH GFR
ALT: 22 U/L (ref 9–46)
Albumin: 4.1 g/dL (ref 3.6–5.1)
BUN: 7 mg/dL (ref 7–25)
Calcium: 8.9 mg/dL (ref 8.6–10.3)
Creat: 0.85 mg/dL (ref 0.70–1.35)
Sodium: 140 mmol/L (ref 135–146)
Total Protein: 6.7 g/dL (ref 6.1–8.1)

## 2023-01-20 LAB — IGG, IGA, IGM
IgG (Immunoglobin G), Serum: 1213 mg/dL (ref 600–1540)
IgM, Serum: 52 mg/dL (ref 50–300)
Immunoglobulin A: 436 mg/dL — ABNORMAL HIGH (ref 70–320)

## 2023-01-20 LAB — HEPATITIS A ANTIBODY, TOTAL: Hepatitis A AB,Total: REACTIVE — AB

## 2023-01-20 LAB — PROTIME-INR: INR: 1.2 — ABNORMAL HIGH

## 2023-01-20 LAB — HEPATITIS B DNA, ULTRAQUANTITATIVE, PCR: Hepatitis B DNA: NOT DETECTED [IU]/mL

## 2023-01-20 LAB — ANTI-SMOOTH MUSCLE ANTIBODY, IGG: Actin (Smooth Muscle) Antibody (IGG): <20 U (ref <20)

## 2023-01-20 LAB — HEPATITIS C ANTIBODY: Hepatitis C Ab: NONREACTIVE

## 2023-01-20 LAB — MITOCHONDRIAL ANTIBODIES: Mitochondrial M2 Ab, IgG: <=20 U (ref <=20.0)

## 2023-01-22 ENCOUNTER — Other Ambulatory Visit: Payer: Self-pay | Admitting: Internal Medicine

## 2023-01-22 DIAGNOSIS — K8689 Other specified diseases of pancreas: Secondary | ICD-10-CM

## 2023-01-23 ENCOUNTER — Ambulatory Visit (HOSPITAL_COMMUNITY)
Admission: RE | Admit: 2023-01-23 | Discharge: 2023-01-23 | Disposition: A | Discharge status: Home / Self Care | Payer: Medicare Other | Source: Ambulatory Visit | Attending: Gastroenterology | Admitting: Gastroenterology

## 2023-01-23 DIAGNOSIS — K824 Cholesterolosis of gallbladder: Secondary | ICD-10-CM | POA: Diagnosis not present

## 2023-01-23 DIAGNOSIS — K7460 Unspecified cirrhosis of liver: Secondary | ICD-10-CM | POA: Insufficient documentation

## 2023-01-27 NOTE — Telephone Encounter (Signed)
Called pt as he never responded to [employer], LMOVM to call back

## 2023-02-05 ENCOUNTER — Other Ambulatory Visit: Payer: Self-pay | Admitting: Internal Medicine

## 2023-02-05 ENCOUNTER — Encounter: Payer: Self-pay | Admitting: *Deleted

## 2023-02-05 DIAGNOSIS — K8689 Other specified diseases of pancreas: Secondary | ICD-10-CM

## 2023-02-25 ENCOUNTER — Other Ambulatory Visit: Payer: Self-pay | Admitting: Internal Medicine

## 2023-02-25 DIAGNOSIS — K219 Gastro-esophageal reflux disease without esophagitis: Secondary | ICD-10-CM

## 2023-02-25 DIAGNOSIS — M25473 Effusion, unspecified ankle: Secondary | ICD-10-CM

## 2023-02-25 DIAGNOSIS — G4700 Insomnia, unspecified: Secondary | ICD-10-CM

## 2023-02-25 DIAGNOSIS — I10 Essential (primary) hypertension: Secondary | ICD-10-CM

## 2023-02-25 DIAGNOSIS — K8689 Other specified diseases of pancreas: Secondary | ICD-10-CM

## 2023-03-06 ENCOUNTER — Other Ambulatory Visit: Payer: Self-pay

## 2023-03-06 DIAGNOSIS — Z1211 Encounter for screening for malignant neoplasm of colon: Secondary | ICD-10-CM

## 2023-03-18 ENCOUNTER — Ambulatory Visit (INDEPENDENT_AMBULATORY_CARE_PROVIDER_SITE_OTHER): Payer: Medicare Other

## 2023-03-18 VITALS — Ht 68.0 in | Wt 162.0 lb

## 2023-03-18 DIAGNOSIS — Z0100 Encounter for examination of eyes and vision without abnormal findings: Secondary | ICD-10-CM

## 2023-03-18 DIAGNOSIS — Z0000 Encounter for general adult medical examination without abnormal findings: Secondary | ICD-10-CM

## 2023-03-18 NOTE — Progress Notes (Signed)
 Subjective:   Noah Henderson is a 72 y.o. male who presents for Medicare Annual/Subsequent preventive examination.  Visit Complete: Virtual  I connected with  [name] on 03/18/23 by a audio enabled telemedicine application and verified that I am speaking with the correct person using two identifiers.  Patient Location: Home  Provider Location: Home Office  I discussed the limitations of evaluation and management by telemedicine. The patient expressed understanding and agreed to proceed.  Patient Medicare AWV questionnaire was completed by the patient on na; I have confirmed that all information answered by patient is correct and no changes since this date.  Review of Systems     Cardiac Risk Factors include: advanced age (>76men, >89 women);dyslipidemia;hypertension;male gender;sedentary lifestyle;Other (see comment), Risk factor comments: AIDS     Objective:    Today's Vitals   03/18/23 1101  Weight: 162 lb (73.5 kg)  Height: 5\' 8"  (1.727 m)   Body mass index is 24.63 kg/m.     03/18/2023   11:09 AM 02/10/2022    3:11 PM  Advanced Directives  Does Patient Have a Medical Advance Directive? No Yes  Type of Special educational needs teacher of [place];Living will  Does patient want to make changes to medical advance directive?  Yes (Inpatient - patient defers changing a medical advance directive at this time - Information given)  Would patient like information on creating a medical advance directive? No - Patient declined     Current Medications (verified) Outpatient Encounter Medications as of 03/18/2023  Medication Sig   amitriptyline (ELAVIL) 50 MG tablet TAKE 1 TABLET(50 MG) BY MOUTH AT BEDTIME   atorvastatin (LIPITOR) 40 MG tablet Take 1 tablet (40 mg total) by mouth daily.   CREON 24000-76000 units CPEP TAKE 2 CAPSULES BY MOUTH THREE TIMES DAILY   Darunavir-Cobicistat-Emtricitabine-Tenofovir Alafenamide (SYMTUZA) 800-150-200-10 MG TABS Take 1 tablet by mouth  daily with breakfast.   furosemide (LASIX) 20 MG tablet TAKE 1 TABLET(20 MG) BY MOUTH DAILY AS NEEDED FOR SWELLING OR LOWER EXTREMITY SWELLING   metoprolol succinate (TOPROL-XL) 100 MG 24 hr tablet TAKE 1 TABLET BY MOUTH DAILY WITH OR IMMEDIATELY FOLLOWING A MEAL   omeprazole (PRILOSEC) 40 MG capsule TAKE 1 CAPSULE BY MOUTH EVERY DAY   Ensure (ENSURE) Take 1 Can by mouth 2 (two) times daily between meals for 30 doses. For aids wasting syndrome crhonic pancreatitis   loratadine (CLARITIN) 10 MG tablet Take 1 tablet (10 mg total) by mouth daily.   sildenafil (VIAGRA) 25 MG tablet Take 1 tablet (25 mg total) by mouth daily as needed for erectile dysfunction.   No facility-administered encounter medications on file as of 03/18/2023.    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 No pertinent surgical history. Family History  Problem Relation Age of Onset   Heart disease Mother    Breast cancer Mother    Heart disease Father    Mental illness Father    Heart attack Father    Heart disease Brother    Colon cancer Neg Hx    Prostate cancer Neg Hx    Social History    Socioeconomic History   Marital status: Single    Spouse name: Not on file   Number of children: Not on file   Years of education: Not on file   Highest education level: Not on file  Occupational History   Not on file  Tobacco Use   Smoking status: Never    Passive exposure: Never   Smokeless tobacco: Never  Substance and Sexual Activity   Alcohol use: No   Drug use: No   Sexual activity: Yes    Partners: Male    Birth control/protection: Condom    Comment: accepted condoms  Other Topics Concern   Not on file  Social History Narrative   Lives with a partner. Patient is retired.    Social Determinants of Health   Financial Resource Strain: Low Risk  (03/18/2023)   Overall Financial Resource Strain (CARDIA)    Difficulty of Paying Living Expenses: Not hard at all  Food Insecurity: No Food Insecurity (03/18/2023)   Hunger Vital Sign    Worried About Running Out of Food in the Last Year: Never true    Ran Out of Food in the Last Year: Never true  Transportation Needs: No Transportation Needs (03/18/2023)   PRAPARE - Administrator, Civil Service (Medical): No    Lack of Transportation (Non-Medical): No  Physical Activity: Sufficiently Active (03/18/2023)   Exercise Vital Sign    Days of Exercise per Week: 7 days    Minutes of Exercise per Session: 30 min  Stress: No Stress Concern Present (03/18/2023)   [employer] of Occupational Health - Occupational Stress Questionnaire    Feeling of Stress : Not at all  Social Connections: Socially Isolated (03/18/2023)   Social Connection and Isolation Panel [NHANES]    Frequency of Communication with Friends and Family: More than three times a week    Frequency of Social Gatherings with Friends and Family: More than three times a week    Attends Religious Services: Never    Database administrator or Organizations: No    Attends Engineer, structural: Never    Marital Status: Never married    Tobacco  Counseling Counseling given: Yes   Clinical Intake:  Pre-visit preparation completed: Yes  Pain : No/denies pain     BMI - recorded: 24.63 Nutritional Status: BMI of 19-24  Normal Nutritional Risks: None Diabetes: No  How often do you need to have someone help you when you read instructions, pamphlets, or other written materials from your doctor or pharmacy?: 1 - Never  Interpreter Needed?: No  Information entered by ::  Tynia Wiers, CMA   Activities of Daily Living    03/18/2023   11:03 AM  In your present state of health, do you have any difficulty performing the following activities:  Hearing? 0  Vision? 0  Difficulty concentrating or making decisions? 0  Walking or climbing stairs? 0  Dressing or bathing? 0  Doing errands, shopping? 0  Preparing Food and eating ? N  Using the  Toilet? N  In the past six months, have you accidently leaked urine? N  Do you have problems with loss of bowel control? N  Managing your Medications? N  Managing your Finances? N  Housekeeping or managing your Housekeeping? N    Patient Care Team: Billie Lade, MD as PCP - General (Internal Medicine) Daiva Eves, Lisette Grinder, MD as Consulting Physician (Infectious Diseases)  Indicate any recent Medical Services you may have received from other than [facility] providers in the past year (date may be approximate).     Assessment:   This is a routine wellness examination for Naithan.  Hearing/Vision screen Hearing Screening - Comments:: .Patient denies any hearing difficulties.    Dietary issues and exercise activities discussed:     Goals Addressed             This Visit's Progress    Patient Stated   On track    Stay healthy       Depression Screen    03/18/2023   11:07 AM 11/14/2022   10:33 AM 11/11/2022   10:55 AM 06/16/2022   10:22 AM 05/14/2022   10:30 AM 02/10/2022    3:12 PM 02/10/2022    3:09 PM  PHQ 2/9 Scores  PHQ - 2 Score 0 0 0 0 0 0 0  PHQ- 9 Score  2          Fall Risk    03/18/2023   11:09 AM 11/14/2022   10:33 AM 11/11/2022   10:55 AM 06/16/2022   10:22 AM 05/14/2022   10:30 AM  Fall Risk   Falls in the past year? 0 0 0 0 0  Number falls in past yr: 0 0 0 0   Injury with Fall? 0 0 0 0   Risk for fall due to : No Fall Risks No Fall Risks No Fall Risks No Fall Risks No Fall Risks  Follow up Falls prevention discussed Falls evaluation completed Falls evaluation completed Falls evaluation completed Falls evaluation completed    MEDICARE RISK AT HOME:  Medicare Risk at Home - 03/18/23 1109     Any stairs in or around the home? Yes    If so, are there any without handrails? No    Home free of loose throw rugs in walkways, pet beds, electrical cords, etc? Yes    Adequate lighting in your home to reduce risk of falls? Yes    Life alert? No    Use of a cane, walker or w/c? No    Grab bars in the bathroom? Yes    Shower chair or bench in shower? Yes    Elevated toilet seat or a handicapped toilet? No             TIMED UP AND GO:  Was the test performed?  No    Cognitive Function:    02/10/2022    3:13 PM  MMSE - Mini Mental State Exam  Not completed: Unable to complete        03/18/2023   11:10 AM 02/10/2022    3:13 PM  6CIT Screen  What Year? 0 points 0 points  What month? 0 points 0 points  What time? 0 points 0 points  Count back from 20 0 points 0 points  Months in reverse 0 points 0 points  Repeat phrase 0 points 0 points  Total Score 0 points 0 points    Immunizations Immunization History  Administered Date(s) Administered   Fluad Quad(high Dose  65+) 07/18/2019, 06/13/2020, 10/24/2021, 06/16/2022   Hepatitis A 03/19/2011, 07/20/2012   Hepatitis B 03/19/2011, 06/09/2011, 07/20/2012, 08/23/2012   Influenza Split 06/09/2011, 07/20/2012   Influenza Whole 07/13/2010   Influenza,inj,Quad PF,6+ Mos 06/20/2013, 08/20/2015, 05/13/2016, 06/17/2017   Influenza-Unspecified 06/02/2018   Moderna Sars-Covid-2 Vaccination  12/12/2019, 01/09/2020, 08/24/2020   PNEUMOCOCCAL CONJUGATE-20 10/24/2021   Pneumococcal Conjugate-13 04/21/2018   Pneumococcal Polysaccharide-23 07/03/2008, 06/20/2013, 03/21/2019   Tdap 04/21/2018    TDAP status: Up to date  Flu Vaccine status: Up to date  Pneumococcal vaccine status: Up to date  Covid-19 vaccine status: Information provided on how to obtain vaccines.   Qualifies for Shingles Vaccine? Yes   Zostavax completed No   Shingrix Completed?: No.    Education has been provided regarding the importance of this vaccine. Patient has been advised to call insurance company to determine out of pocket expense if they have not yet received this vaccine. Advised may also receive vaccine at local pharmacy or Health Dept. Verbalized acceptance and understanding.  Screening Tests Health Maintenance  Topic Date Due   Zoster Vaccines- Shingrix (1 of 2) Never done   Colonoscopy  Never done   COVID-19 Vaccine (4 - 2023-24 season) 05/23/2022   Medicare Annual Wellness (AWV)  02/11/2023   INFLUENZA VACCINE  04/23/2023   DTaP/Tdap/Td (2 - Td or Tdap) 04/21/2028   Pneumonia Vaccine 66+ Years old  Completed   Hepatitis C Screening  Completed   HPV VACCINES  Aged Out    Health Maintenance  Health Maintenance Due  Topic Date Due   Zoster Vaccines- Shingrix (1 of 2) Never done   Colonoscopy  Never done   COVID-19 Vaccine (4 - 2023-24 season) 05/23/2022   Medicare Annual Wellness (AWV)  02/11/2023    Colorectal Cancer Screening: Patient has Cologuard kit at home.   Lung Cancer Screening: (Low Dose CT Chest recommended if Age 58-80 years, 20 pack-year currently smoking OR have quit w/in 15years.) does not qualify.   Lung Cancer Screening Referral: n/a  Additional Screening:  Hepatitis C Screening: does not qualify; Completed 01/12/23  Vision Screening: Recommended annual ophthalmology exams for early detection of glaucoma and other disorders of the eye. Is the patient up to date  with their annual eye exam?  No  Who is the provider or what is the name of the office in which the patient attends annual eye exams? Referral placed today If pt is not established with a provider, would they like to be referred to a provider to establish care? No .   Dental Screening: Recommended annual dental exams for proper oral hygiene  Diabetic Foot Exam: N/A  Community Resource Referral / Chronic Care Management: CRR required this visit?  No   CCM required this visit?  No     Plan:     I have personally reviewed and noted the following in the patient's chart:   Medical and social history Use of alcohol, tobacco or illicit drugs  Current medications and supplements including opioid prescriptions. Patient is not currently taking opioid prescriptions. Functional ability and status Nutritional status Physical activity Advanced directives List of other physicians Hospitalizations, surgeries, and ER visits in previous 12 months Vitals Screenings to include cognitive, depression, and falls Referrals and appointments  In addition, I have reviewed and discussed with patient certain preventive protocols, quality metrics, and best practice recommendations. A written personalized care plan for preventive services as well as general preventive health recommendations were provided to patient.   Because this visit was  a virtual/telehealth visit,  certain criteria was not obtained, such a blood pressure, CBG if patient is a diabetic, and timed up and go.   Any medications not marked as taking were not mentioned by the patient (or their caregiver if applicable) when reconciling the medications.   Jordan Hawks Shamari Lofquist, CMA   03/18/2023   After Visit Summary: (MyChart) Due to this being a telephonic visit, the after visit summary with patients personalized plan was offered to patient via MyChart   Nurse Notes: ophthalmology referral placed today

## 2023-03-18 NOTE — Patient Instructions (Signed)
Noah Henderson , Thank you for taking time to come for your Medicare Wellness Visit. I appreciate your ongoing commitment to your health goals. Please review the following plan we discussed and let me know if I can assist you in the future.   These are the goals we discussed:  Goals      Patient Stated     Stay healthy.     Patient Stated     Stay healthy        This is a list of the screening recommended for you and due dates:  Health Maintenance  Topic Date Due   Zoster (Shingles) Vaccine (1 of 2) Never done   Colon Cancer Screening  Never done   COVID-19 Vaccine (4 - 2023-24 season) 05/23/2022   Flu Shot  04/23/2023   Medicare Annual Wellness Visit  03/17/2024   DTaP/Tdap/Td vaccine (2 - Td or Tdap) 04/21/2028   Pneumonia Vaccine  Completed   Hepatitis C Screening  Completed   HPV Vaccine  Aged Out    Advanced directives: Advance directive discussed with you today. Even though you declined this today, please call our office should you change your mind, and we can give you the proper paperwork for you to fill out. Advance care planning is a way to make decisions about medical care that fits your values in case you are ever unable to make these decisions for yourself.  Information on Advanced Care Planning can be found at [facility] Advance Health Care Directives Advance Health Care Directives ([email or web address]    Conditions/risks identified:  You have been referred to [facility] for a complete eye exam. If you haven't heard from them in a few days, please call them to schedule your appointment.  [facility] [address] [place] [address] Phone: [phone]    Next appointment: VIRTUAL/ TELEPHONE VISIT Follow up in one year for your annual wellness visit  April 20, 2024 at 3:30pm telephone   Preventive Care 43 Years and Older, Male  Preventive care refers to lifestyle choices and visits with your health care provider that can promote  health and wellness. What does preventive care include? A yearly physical exam. This is also called an annual well check. Dental exams once or twice a year. Routine eye exams. Ask your health care provider how often you should have your eyes checked. Personal lifestyle choices, including: Daily care of your teeth and gums. Regular physical activity. Eating a healthy diet. Avoiding tobacco and drug use. Limiting alcohol use. Practicing safe sex. Taking low doses of aspirin every day. Taking vitamin and mineral supplements as recommended by your health care provider. What happens during an annual well check? The services and screenings done by your health care provider during your annual well check will depend on your age, overall health, lifestyle risk factors, and family history of disease. Counseling  Your health care provider may ask you questions about your: Alcohol use. Tobacco use. Drug use. Emotional well-being. Home and relationship well-being. Sexual activity. Eating habits. History of falls. Memory and ability to understand (cognition). Work and work Astronomer. Screening  You may have the following tests or measurements: Height, weight, and BMI. Blood pressure. Lipid and cholesterol levels. These may be checked every 5 years, or more frequently if you are over 63 years old. Skin check. Lung cancer screening. You may have this screening every year starting at age 84 if you have a 30-pack-year history of smoking  and currently smoke or have quit within the past 15 years. Fecal occult blood test (FOBT) of the stool. You may have this test every year starting at age 83. Flexible sigmoidoscopy or colonoscopy. You may have a sigmoidoscopy every 5 years or a colonoscopy every 10 years starting at age 50. Prostate cancer screening. Recommendations will vary depending on your family history and other risks. Hepatitis C blood test. Hepatitis B blood test. Sexually transmitted  disease (STD) testing. Diabetes screening. This is done by checking your blood sugar (glucose) after you have not eaten for a while (fasting). You may have this done every 1-3 years. Abdominal aortic aneurysm (AAA) screening. You may need this if you are a current or former smoker. Osteoporosis. You may be screened starting at age 52 if you are at high risk. Talk with your health care provider about your test results, treatment options, and if necessary, the need for more tests. Vaccines  Your health care provider may recommend certain vaccines, such as: Influenza vaccine. This is recommended every year. Tetanus, diphtheria, and acellular pertussis (Tdap, Td) vaccine. You may need a Td booster every 10 years. Zoster vaccine. You may need this after age 40. Pneumococcal 13-valent conjugate (PCV13) vaccine. One dose is recommended after age 71. Pneumococcal polysaccharide (PPSV23) vaccine. One dose is recommended after age 6. Talk to your health care provider about which screenings and vaccines you need and how often you need them. This information is not intended to replace advice given to you by your health care provider. Make sure you discuss any questions you have with your health care provider. Document Released: 10/05/2015 Document Revised: 05/28/2016 Document Reviewed: 07/10/2015 Elsevier Interactive Patient Education  2017 [employer].  Fall Prevention in the Home Falls can cause injuries. They can happen to people of all ages. There are many things you can do to make your home safe and to help prevent falls. What can I do on the outside of my home? Regularly fix the edges of walkways and driveways and fix any cracks. Remove anything that might make you trip as you walk through a door, such as a raised step or threshold. Trim any bushes or trees on the path to your home. Use bright outdoor lighting. Clear any walking paths of anything that might make someone trip, such as rocks or  tools. Regularly check to see if handrails are loose or broken. Make sure that both sides of any steps have handrails. Any raised decks and porches should have guardrails on the edges. Have any leaves, snow, or ice cleared regularly. Use sand or salt on walking paths during winter. Clean up any spills in your garage right away. This includes oil or grease spills. What can I do in the bathroom? Use night lights. Install grab bars by the toilet and in the tub and shower. Do not use towel bars as grab bars. Use non-skid mats or decals in the tub or shower. If you need to sit down in the shower, use a plastic, non-slip stool. Keep the floor dry. Clean up any water that spills on the floor as soon as it happens. Remove soap buildup in the tub or shower regularly. Attach bath mats securely with double-sided non-slip rug tape. Do not have throw rugs and other things on the floor that can make you trip. What can I do in the bedroom? Use night lights. Make sure that you have a light by your bed that is easy to reach. Do not use any  sheets or blankets that are too big for your bed. They should not hang down onto the floor. Have a firm chair that has side arms. You can use this for support while you get dressed. Do not have throw rugs and other things on the floor that can make you trip. What can I do in the kitchen? Clean up any spills right away. Avoid walking on wet floors. Keep items that you use a lot in easy-to-reach places. If you need to reach something above you, use a strong step stool that has a grab bar. Keep electrical cords out of the way. Do not use floor polish or wax that makes floors slippery. If you must use wax, use non-skid floor wax. Do not have throw rugs and other things on the floor that can make you trip. What can I do with my stairs? Do not leave any items on the stairs. Make sure that there are handrails on both sides of the stairs and use them. Fix handrails that are  broken or loose. Make sure that handrails are as long as the stairways. Check any carpeting to make sure that it is firmly attached to the stairs. Fix any carpet that is loose or worn. Avoid having throw rugs at the top or bottom of the stairs. If you do have throw rugs, attach them to the floor with carpet tape. Make sure that you have a light switch at the top of the stairs and the bottom of the stairs. If you do not have them, ask someone to add them for you. What else can I do to help prevent falls? Wear shoes that: Do not have high heels. Have rubber bottoms. Are comfortable and fit you well. Are closed at the toe. Do not wear sandals. If you use a stepladder: Make sure that it is fully opened. Do not climb a closed stepladder. Make sure that both sides of the stepladder are locked into place. Ask someone to hold it for you, if possible. Clearly mark and make sure that you can see: Any grab bars or handrails. First and last steps. Where the edge of each step is. Use tools that help you move around (mobility aids) if they are needed. These include: Canes. Walkers. Scooters. Crutches. Turn on the lights when you go into a dark area. Replace any light bulbs as soon as they burn out. Set up your furniture so you have a clear path. Avoid moving your furniture around. If any of your floors are uneven, fix them. If there are any pets around you, be aware of where they are. Review your medicines with your doctor. Some medicines can make you feel dizzy. This can increase your chance of falling. Ask your doctor what other things that you can do to help prevent falls. This information is not intended to replace advice given to you by your health care provider. Make sure you discuss any questions you have with your health care provider. Document Released: 07/05/2009 Document Revised: 02/14/2016 Document Reviewed: 10/13/2014 Elsevier Interactive Patient Education  2017 [employer].

## 2023-03-30 ENCOUNTER — Other Ambulatory Visit: Payer: Self-pay | Admitting: Internal Medicine

## 2023-03-30 DIAGNOSIS — K8689 Other specified diseases of pancreas: Secondary | ICD-10-CM

## 2023-04-01 ENCOUNTER — Other Ambulatory Visit: Payer: Self-pay

## 2023-04-01 DIAGNOSIS — K8689 Other specified diseases of pancreas: Secondary | ICD-10-CM

## 2023-04-02 ENCOUNTER — Other Ambulatory Visit: Payer: Self-pay

## 2023-04-02 ENCOUNTER — Telehealth: Payer: Self-pay | Admitting: Internal Medicine

## 2023-04-02 DIAGNOSIS — K8689 Other specified diseases of pancreas: Secondary | ICD-10-CM

## 2023-04-02 MED ORDER — CREON 24000-76000 UNITS PO CPEP
2.0000 | ORAL_CAPSULE | Freq: Three times a day (TID) | ORAL | 3 refills | Status: DC
Start: 2023-04-02 — End: 2023-08-11

## 2023-04-02 NOTE — Telephone Encounter (Signed)
Refill sent.

## 2023-04-02 NOTE — Telephone Encounter (Signed)
Prescription Request  04/02/2023  LOV: 11/14/2022  What is the name of the medication or equipment? CREON 24000-76000 units CPEP [[id number]]   REQUEST TO GET QUANTITY OF 200  FOR A 30 DAY SUPPLY   Have you contacted your pharmacy to request a refill? Yes   Which pharmacy would you like this se  Pharmacy  [facility] [phone] [facility] - [place], [place] - [address] [place] [address] Phone: [phone]  Fax: [phone] DEA #: [id number]      Patient notified that their request is being sent to the clinical staff for review and that they should receive a response within 2 business days.   Please advise at

## 2023-04-22 IMAGING — DX DG CHEST 2V
2 series · 2 of 2 positions shown · non-contrast
Comparison: None.

CLINICAL DATA: 70-year-old male with chronic cough.

EXAM:
CHEST - 2 VIEW

[dg chest 2 view (1 of 2)]
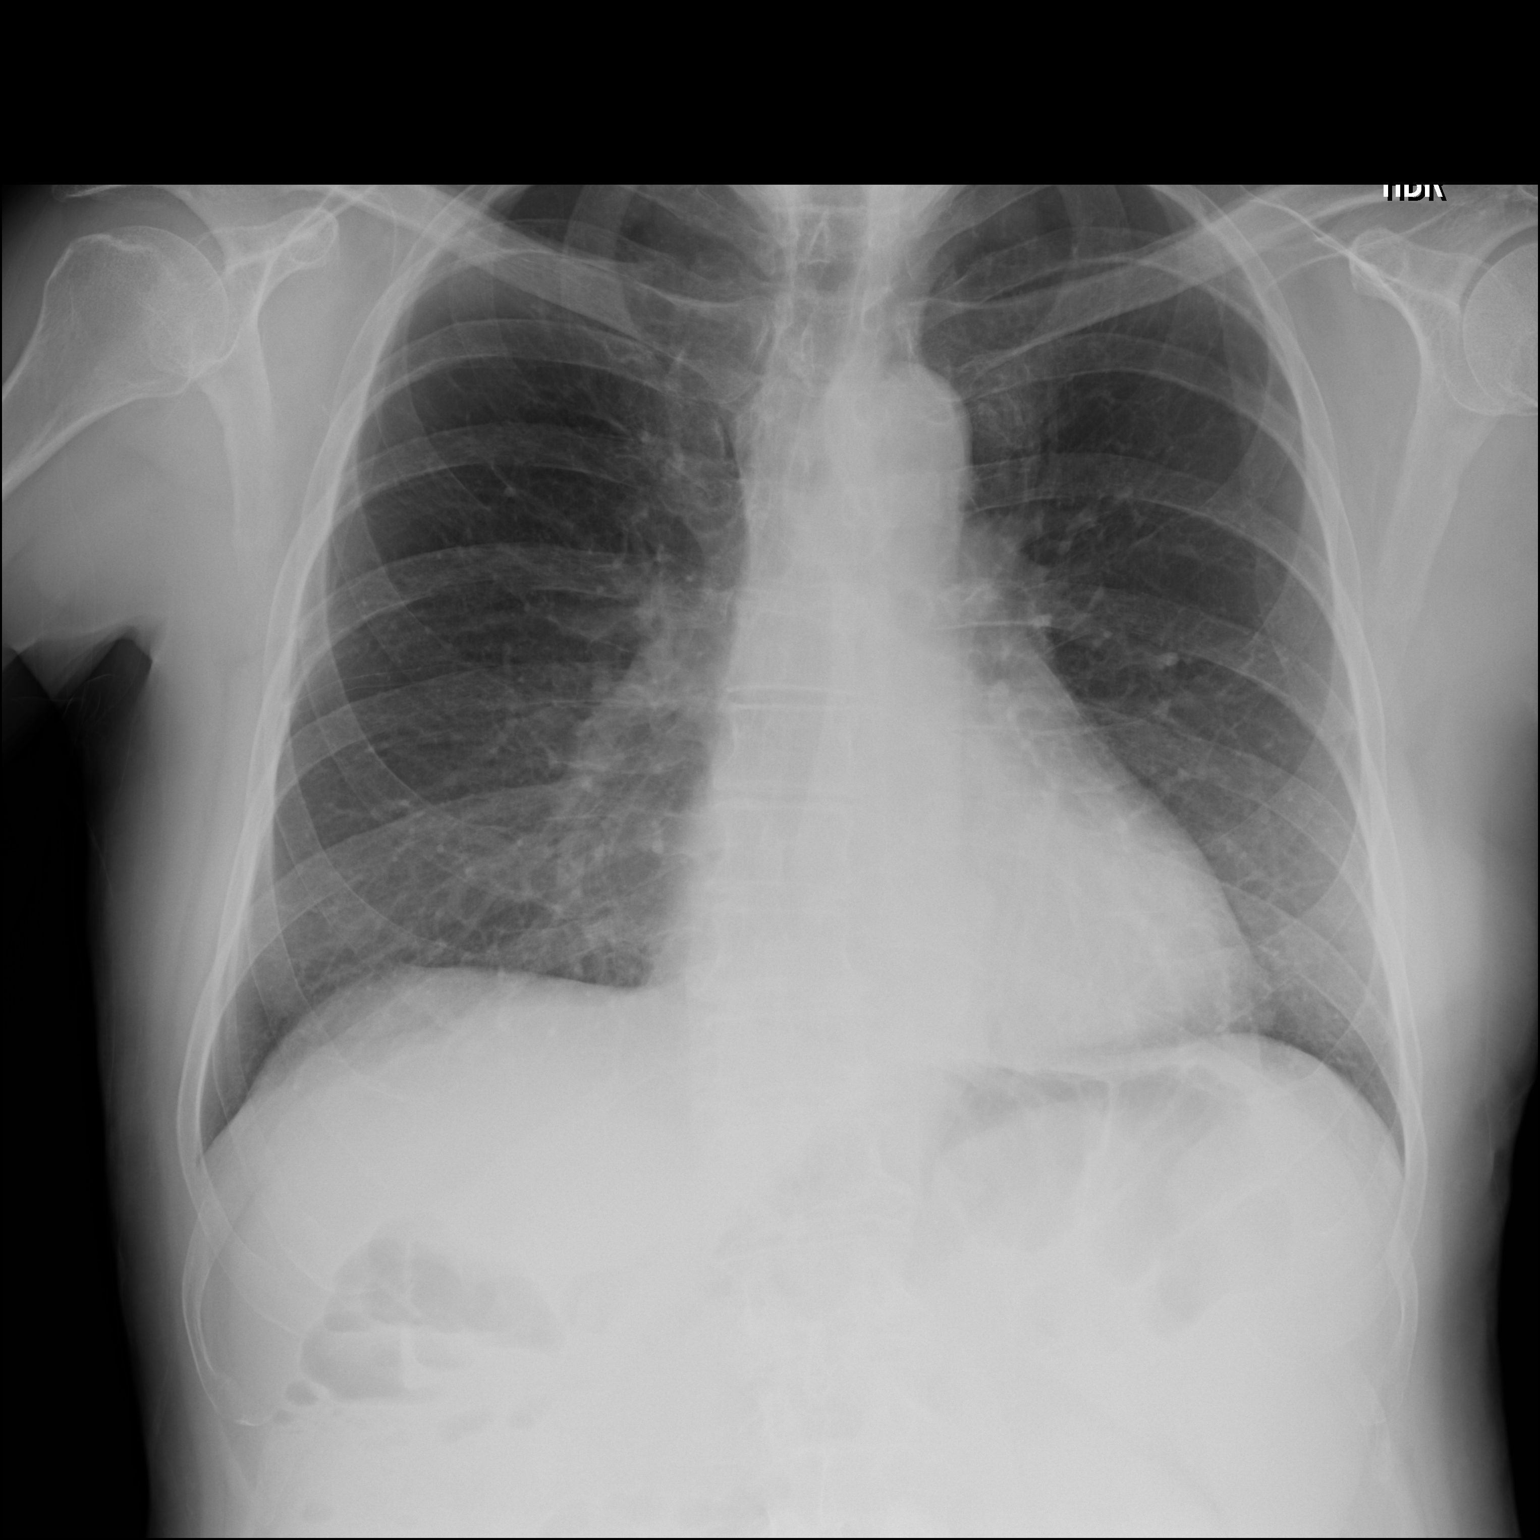

[dg chest 2 view (2 of 2)]
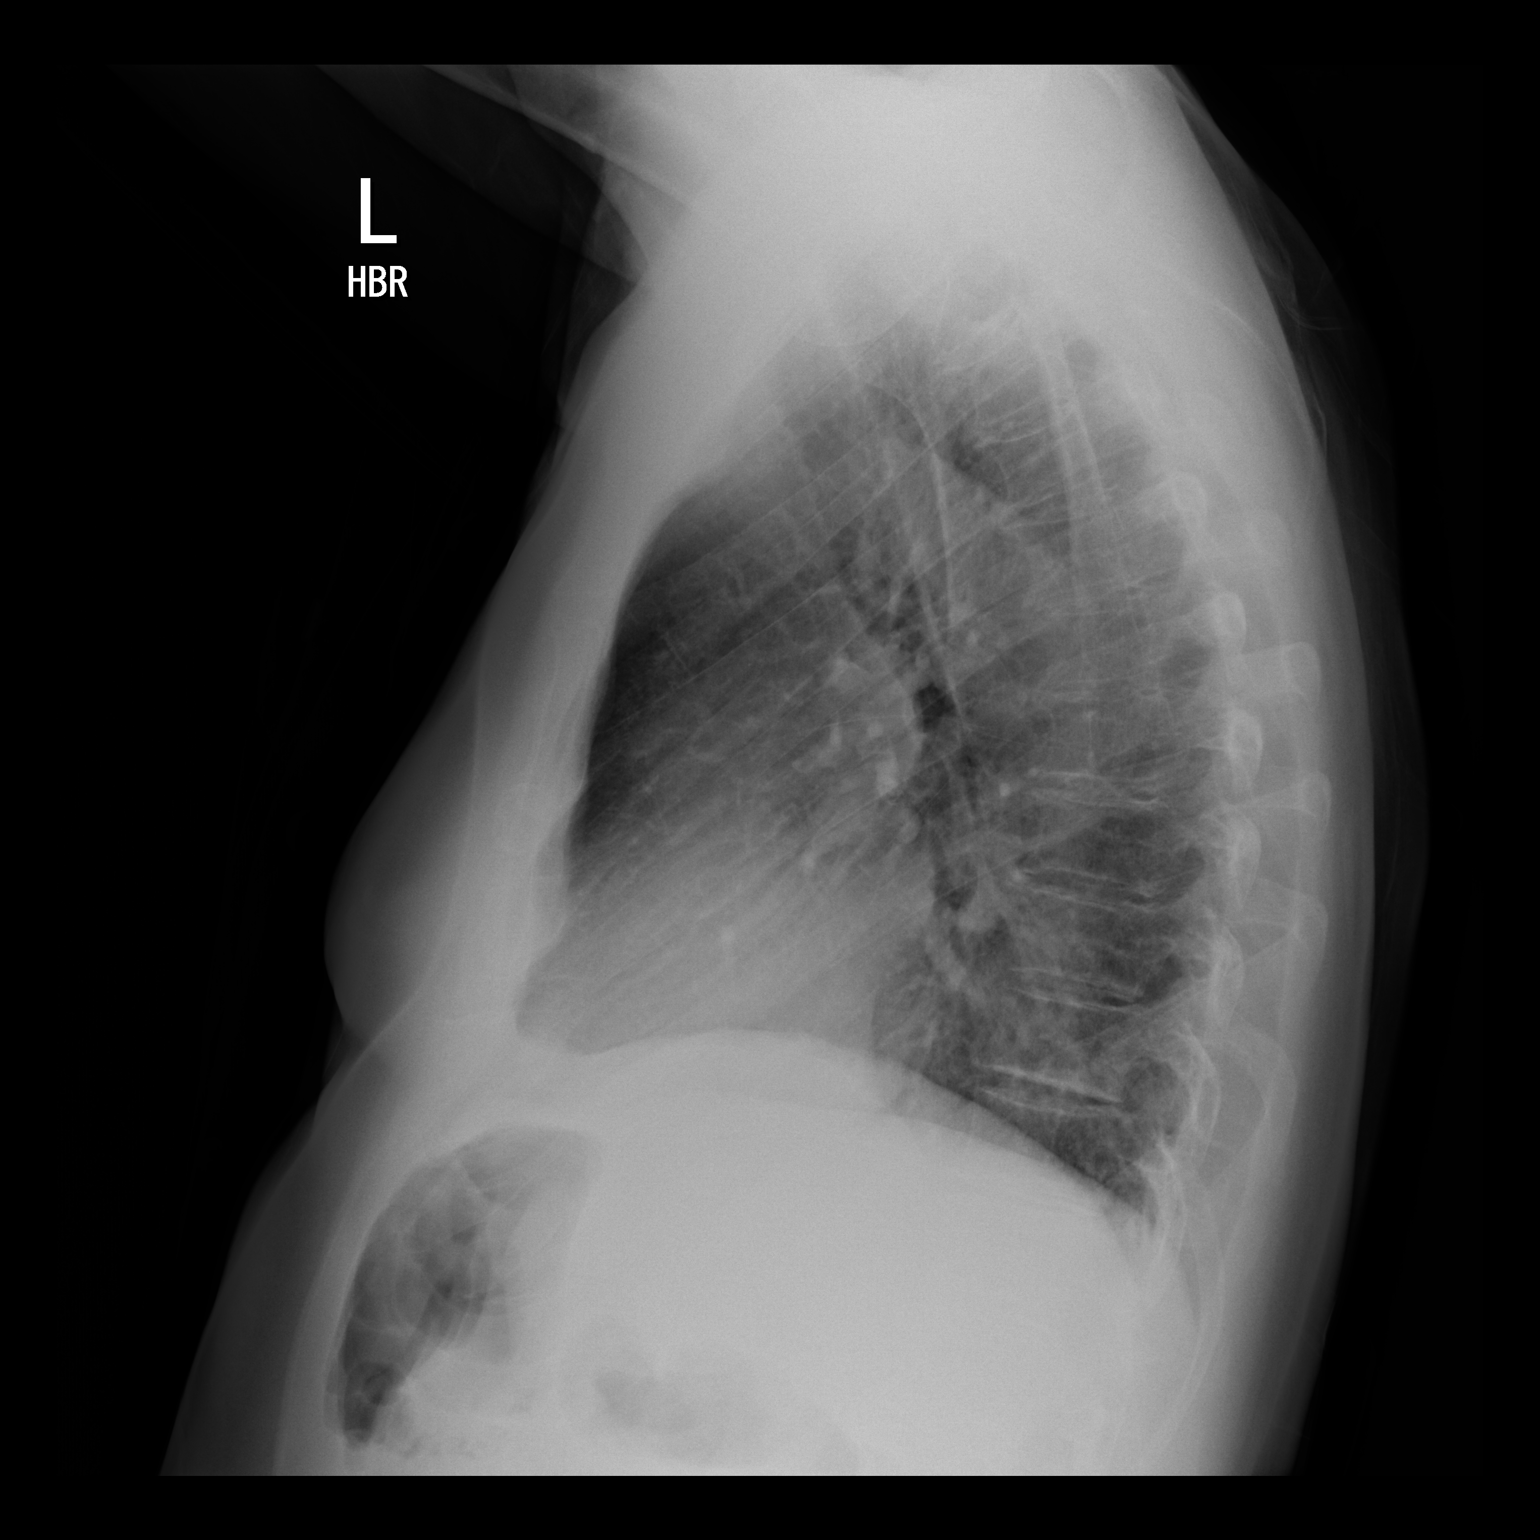

[2 of 2 positions shown; findings below may reference images not displayed]

FINDINGS: The lungs are clear. There is no pleural effusion pneumothorax. The
cardiac silhouette is within limits. No acute osseous pathology.
IMPRESSION: No active cardiopulmonary disease.

## 2023-05-11 ENCOUNTER — Other Ambulatory Visit: Payer: Self-pay | Admitting: Internal Medicine

## 2023-05-11 DIAGNOSIS — G4700 Insomnia, unspecified: Secondary | ICD-10-CM

## 2023-05-11 DIAGNOSIS — I10 Essential (primary) hypertension: Secondary | ICD-10-CM

## 2023-05-11 DIAGNOSIS — K219 Gastro-esophageal reflux disease without esophagitis: Secondary | ICD-10-CM

## 2023-05-14 ENCOUNTER — Other Ambulatory Visit: Payer: Self-pay

## 2023-05-14 ENCOUNTER — Other Ambulatory Visit (HOSPITAL_COMMUNITY)
Admission: RE | Admit: 2023-05-14 | Discharge: 2023-05-14 | Disposition: A | Discharge status: Home / Self Care | Payer: Medicare Other | Source: Ambulatory Visit | Attending: Infectious Disease | Admitting: Infectious Disease

## 2023-05-14 ENCOUNTER — Ambulatory Visit (INDEPENDENT_AMBULATORY_CARE_PROVIDER_SITE_OTHER): Payer: Medicare Other | Admitting: Infectious Disease

## 2023-05-14 ENCOUNTER — Ambulatory Visit: Payer: Medicare Other

## 2023-05-14 ENCOUNTER — Encounter: Payer: Self-pay | Admitting: Infectious Disease

## 2023-05-14 VITALS — BP 119/73 | HR 68 | Temp 97.8°F | Ht 68.0 in | Wt 157.0 lb

## 2023-05-14 DIAGNOSIS — B20 Human immunodeficiency virus [HIV] disease: Secondary | ICD-10-CM

## 2023-05-14 DIAGNOSIS — K7460 Unspecified cirrhosis of liver: Secondary | ICD-10-CM

## 2023-05-14 DIAGNOSIS — E881 Lipodystrophy, not elsewhere classified: Secondary | ICD-10-CM

## 2023-05-14 DIAGNOSIS — Z79899 Other long term (current) drug therapy: Secondary | ICD-10-CM | POA: Diagnosis not present

## 2023-05-14 DIAGNOSIS — K861 Other chronic pancreatitis: Secondary | ICD-10-CM | POA: Diagnosis not present

## 2023-05-14 DIAGNOSIS — E785 Hyperlipidemia, unspecified: Secondary | ICD-10-CM

## 2023-05-14 MED ORDER — ATORVASTATIN CALCIUM 40 MG PO TABS: 40.0000 mg | ORAL_TABLET | Freq: Every day | ORAL | 11 refills | Status: DC

## 2023-05-14 MED ORDER — SYMTUZA 800-150-200-10 MG PO TABS
1.0000 | ORAL_TABLET | Freq: Every day | ORAL | 11 refills | Status: DC
Start: 2023-05-14 — End: 2023-11-18

## 2023-05-14 NOTE — Progress Notes (Signed)
Chief complaints: Follow-up for HIV disease on medications  Patient ID: Noah Henderson, male    DOB: 11-Mar-1951, 72 y.o.   MRN: [id number]  HPI  72 year old Caucasian man with HIV, previously well controlled on viramune XR and truvada with breakthrough viremia found to have a K103 mutation changed to Prezcobix and truvada--> Prezcobix and Descovy + MVC vs DTG with good virological suppression and stable CD4 count.  He is now only on Symtuza   He is seeing GI for workup of his hepatic cirrhosis he also had screening ultrasound he was also scheduled for EGD and colonoscopy was had but was hesitant about these procedures I have encouraged him to go forward and do them.        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  Family History  Problem Relation Age of Onset   Heart disease Mother    Breast cancer Mother    Heart disease Father    Mental illness Father    Heart attack Father    Heart disease Brother    Colon cancer Neg Hx    Prostate cancer Neg Hx       Social History   Socioeconomic History   Marital status: Single    Spouse name: Not on file   Number of children: Not on file   Years  of education: Not on file   Highest education level: Not on file  Occupational History   Not on file  Tobacco Use   Smoking status: Never    Passive exposure: Never   Smokeless tobacco: Never  Substance and Sexual Activity   Alcohol use: No   Drug use: No   Sexual activity: Yes    Partners: Male    Birth control/protection: Condom    Comment: accepted condoms  Other Topics Concern   Not on file  Social History Narrative   Lives with a partner. Patient is retired.    Social Determinants of Health   Financial Resource Strain: Low Risk  (03/18/2023)   Overall Financial Resource Strain (CARDIA)    Difficulty of Paying Living Expenses: Not hard at all  Food Insecurity: No Food Insecurity (03/18/2023)   Hunger Vital Sign    Worried About Running Out of Food in the Last Year: Never true    Ran Out of Food in the Last Year: Never true  Transportation Needs: No Transportation Needs (03/18/2023)   PRAPARE - Administrator, Civil Service (Medical): No    Lack of Transportation (Non-Medical): No  Physical Activity: Sufficiently Active (03/18/2023)   Exercise Vital Sign    Days of Exercise per Week: 7 days  Minutes of Exercise per Session: 30 min  Stress: No Stress Concern Present (03/18/2023)   [employer] of Occupational Health - Occupational Stress Questionnaire    Feeling of Stress : Not at all  Social Connections: Socially Isolated (03/18/2023)   Social Connection and Isolation Panel [NHANES]    Frequency of Communication with Friends and Family: More than three times a week    Frequency of Social Gatherings with Friends and Family: More than three times a week    Attends Religious Services: Never    Database administrator or Organizations: No    Attends Engineer, structural: Never    Marital Status: Never married    Allergies  Allergen Reactions   Penicillins      Current Outpatient Medications:    amitriptyline (ELAVIL) 50 MG tablet, TAKE 1  TABLET(50 MG) BY MOUTH AT BEDTIME, Disp: 30 tablet, Rfl: 1   atorvastatin (LIPITOR) 40 MG tablet, Take 1 tablet (40 mg total) by mouth daily., Disp: 30 tablet, Rfl: 11   Darunavir-Cobicistat-Emtricitabine-Tenofovir Alafenamide (SYMTUZA) 800-150-200-10 MG TABS, Take 1 tablet by mouth daily with breakfast., Disp: 30 tablet, Rfl: 11   furosemide (LASIX) 20 MG tablet, TAKE 1 TABLET(20 MG) BY MOUTH DAILY AS NEEDED FOR SWELLING OR LOWER EXTREMITY SWELLING, Disp: 30 tablet, Rfl: 3   loratadine (CLARITIN) 10 MG tablet, Take 1 tablet (10 mg total) by mouth daily., Disp: 30 tablet, Rfl: 11   metoprolol succinate (TOPROL-XL) 100 MG 24 hr tablet, TAKE 1 TABLET BY MOUTH DAILY WITH OR IMMEDIATELY FOLLOWING A MEAL, Disp: 30 tablet, Rfl: 1   omeprazole (PRILOSEC) 40 MG capsule, TAKE 1 CAPSULE BY MOUTH EVERY DAY, Disp: 30 capsule, Rfl: 1   Pancrelipase, Lip-Prot-Amyl, (CREON) 24000-76000 units CPEP, Take 2 capsules (48,000 Units total) by mouth 3 (three) times daily., Disp: 200 capsule, Rfl: 3   sildenafil (VIAGRA) 25 MG tablet, Take 1 tablet (25 mg total) by mouth daily as needed for erectile dysfunction., Disp: 10 tablet, Rfl: 5   Ensure (ENSURE), Take 1 Can by mouth 2 (two) times daily between meals for 30 doses. For aids wasting syndrome crhonic pancreatitis, Disp: 7110 mL, Rfl: 5   Review of Systems  Constitutional:  Negative for activity change, appetite change, chills, diaphoresis, fatigue, fever and unexpected weight change.  HENT:  Negative for congestion, rhinorrhea, sinus pressure, sneezing, sore throat and trouble swallowing.   Eyes:  Negative for photophobia and visual disturbance.  Respiratory:  Negative for cough, chest tightness, shortness of breath, wheezing and stridor.   Cardiovascular:  Negative for chest pain, palpitations and leg swelling.  Gastrointestinal:  Negative for abdominal distention, abdominal pain, anal bleeding, blood in stool, constipation, diarrhea, nausea and vomiting.   Genitourinary:  Negative for difficulty urinating, dysuria, flank pain and hematuria.  Musculoskeletal:  Negative for arthralgias, back pain, gait problem, joint swelling and myalgias.  Skin:  Negative for color change, pallor, rash and wound.  Neurological:  Negative for dizziness, tremors, weakness and light-headedness.  Hematological:  Negative for adenopathy. Does not bruise/bleed easily.  Psychiatric/Behavioral:  Negative for agitation, behavioral problems, confusion, decreased concentration, dysphoric mood and sleep disturbance.        Objective:   Physical Exam Constitutional:      Appearance: He is well-developed.  HENT:     Head: Normocephalic and atraumatic.  Eyes:     Conjunctiva/sclera: Conjunctivae normal.  Cardiovascular:     Rate and Rhythm: Normal rate and regular rhythm.  Pulmonary:     Effort: Pulmonary  effort is normal. No respiratory distress.     Breath sounds: No wheezing.  Abdominal:     General: There is no distension.     Palpations: Abdomen is soft.  Musculoskeletal:        General: No tenderness. Normal range of motion.     Cervical back: Normal range of motion and neck supple.  Skin:    General: Skin is warm and dry.     Coloration: Skin is not pale.     Findings: No erythema or rash.  Neurological:     General: No focal deficit present.     Mental Status: He is alert and oriented to person, place, and time.  Psychiatric:        Mood and Affect: Mood normal.        Behavior: Behavior normal.        Thought Content: Thought content normal.        Judgment: Judgment normal.           Assessment & Plan:   HIV disease:  I will add order HIV viral load CD4 count CBC with differential CMP, RPR GC and chlamydia and I will continue  [facility] prescription  Cirrhosis will continue to follow with GI.  Chronic pancreatitis will continue on Creon.  Hyperlipidemia we will continue his atorvastatin.

## 2023-05-15 ENCOUNTER — Encounter: Payer: Self-pay | Admitting: Internal Medicine

## 2023-05-15 ENCOUNTER — Ambulatory Visit (INDEPENDENT_AMBULATORY_CARE_PROVIDER_SITE_OTHER): Payer: Medicare Other | Admitting: Internal Medicine

## 2023-05-15 VITALS — BP 129/69 | HR 63 | Ht 68.0 in | Wt 157.4 lb

## 2023-05-15 DIAGNOSIS — I10 Essential (primary) hypertension: Secondary | ICD-10-CM | POA: Diagnosis not present

## 2023-05-15 DIAGNOSIS — K8689 Other specified diseases of pancreas: Secondary | ICD-10-CM | POA: Diagnosis not present

## 2023-05-15 DIAGNOSIS — E785 Hyperlipidemia, unspecified: Secondary | ICD-10-CM | POA: Diagnosis not present

## 2023-05-15 DIAGNOSIS — R600 Localized edema: Secondary | ICD-10-CM

## 2023-05-15 DIAGNOSIS — E876 Hypokalemia: Secondary | ICD-10-CM | POA: Diagnosis not present

## 2023-05-15 DIAGNOSIS — K5904 Chronic idiopathic constipation: Secondary | ICD-10-CM | POA: Diagnosis not present

## 2023-05-15 DIAGNOSIS — B20 Human immunodeficiency virus [HIV] disease: Secondary | ICD-10-CM

## 2023-05-15 LAB — T-HELPER CELLS (CD4) COUNT (NOT AT ARMC)
CD4 % Helper T Cell: 39 % (ref 33–65)
CD4 T Cell Abs: 261 /uL — ABNORMAL LOW (ref 400–1790)

## 2023-05-15 LAB — URINE CYTOLOGY ANCILLARY ONLY
Chlamydia: NEGATIVE
Comment: NEGATIVE
Comment: NORMAL
Neisseria Gonorrhea: NEGATIVE

## 2023-05-15 NOTE — Progress Notes (Signed)
Established Patient Office Visit  Subjective   Patient ID: Noah Henderson, male    DOB: February 23, 1951  Age: 72 y.o. MRN: [id number]  Chief Complaint  Patient presents with   Constipation    6 month follow up    [name] returns to care today for routine follow-up.  He was last evaluated by me on 2/23.  At that time he endorsed persistent symptoms of constipation and was referred to gastroenterology.  Lasix 20 mg daily as needed was also prescribed for management of lower extremity edema.  In the interim, he has establish care with gastroenterology.  Last evaluated in April.  EGD/colonoscopy discussed but not completed.  MiraLAX recommended as well.  More recently, he was seen by infectious disease yesterday (8/22) for follow-up.  [name] reports feeling well today.  He continues to endorse bloating and constipation.  He is trying home remedies and is intermittently using MiraLAX.  He does not have additional concerns to discuss today.  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 Social History   Tobacco Use   Smoking status: Never    Passive exposure: Never   Smokeless tobacco: Never  Substance Use  Topics   Alcohol use: No   Drug use: No   Family History  Problem Relation Age of Onset   Heart disease Mother    Breast cancer Mother    Heart disease Father    Mental illness Father    Heart attack Father    Heart disease Brother    Colon cancer Neg Hx    Prostate cancer Neg Hx    Allergies  Allergen Reactions   Penicillins    ROS    Objective:     BP 129/69 (BP Location: Left Arm, Patient Position: Sitting, Cuff Size: Small)   Pulse 63   Ht 5\' 8"  (1.727 m)   Wt 157 lb 6.4 oz (71.4 kg)   SpO2 94%   BMI 23.93 kg/m  BP Readings from Last 3 Encounters:  05/15/23 129/69  05/14/23 119/73  01/12/23 (!) 131/59   Physical Exam  Last CBC Lab Results  Component Value Date   WBC 3.9 05/14/2023   HGB 12.0 (L) 05/14/2023   HCT 33.6 (L) 05/14/2023   MCV 86.4 05/14/2023   MCH 30.8 05/14/2023   RDW 14.2 05/14/2023   PLT 77 (L) 05/14/2023   Last metabolic panel Lab Results  Component Value Date   GLUCOSE 97 05/14/2023   NA 141 05/14/2023   K 3.3 (L) 05/14/2023   CL 108 05/14/2023   CO2 24 05/14/2023   BUN 13 05/14/2023   CREATININE 0.99 05/14/2023   EGFR 82 05/14/2023  CALCIUM 9.3 05/14/2023   PHOS 3.1 07/05/2020   PROT 6.3 05/14/2023   ALBUMIN 3.8 03/24/2017   BILITOT 1.1 05/14/2023   ALKPHOS 75 03/24/2017   AST 37 (H) 05/14/2023   ALT 22 05/14/2023   Last lipids Lab Results  Component Value Date   CHOL 95 05/14/2023   HDL 33 (L) 05/14/2023   LDLCALC 40 05/14/2023   TRIG 135 05/14/2023   CHOLHDL 2.9 05/14/2023   Last hemoglobin A1c Lab Results  Component Value Date   HGBA1C 5.1 06/16/2022   Last thyroid functions Lab Results  Component Value Date   TSH 2.660 06/16/2022   T4TOTAL 5.7 03/26/2016   Last vitamin D Lab Results  Component Value Date   VD25OH 30.2 06/16/2022   Last vitamin B12 and Folate Lab Results  Component Value Date   VITAMINB12 724 06/16/2022   FOLATE 7.5 01/12/2023     Assessment & Plan:   Problem List Items  Addressed This Visit       HTN (hypertension) (Chronic)    Remains adequately controlled with metoprolol.  No medication changes are indicated today.      Pancreatic insufficiency    Taking Creon as prescribed.      Chronic idiopathic constipation    Etiology remains unclear.  He continues to endorse symptoms today.  Most recently, twice daily use of MiraLAX has been recommended.  He reports today that he is trying home remedies that are temporarily effective before symptoms return.  Last evaluated by gastroenterology in April.  EGD/colonoscopy discussed at that time but not completed. -Given persistent symptoms, I have recommended that Mr. Bilson contact gastroenterology to arrange follow-up.      Human immunodeficiency virus (HIV) disease (HCC)    Followed by ID.  Seen for follow-up yesterday (8/22).  Remains on Symtuza.      Hyperlipidemia    He is currently prescribed atorvastatin 40 mg daily.  Lipid panel updated earlier this week and reflects adequate control.  No medication changes are indicated.      Lower extremity edema    Lasix 20 mg daily as needed was prescribed at his last appointment for management of bilateral lower extremity edema.  Today he reports that he has been taking Lasix 20 mg daily.  No lower extremity edema is present on exam.  Hypokalemia noted on labs from yesterday. -I have recommended that Mr. Harbottle reduce Lasix use to as needed if edema is present      Hypokalemia - Primary    K+ 3.3 on labs from yesterday.  I suspect this is due to chronic loop diuretic use.  He will reduce Lasix use to as needed.  Repeat BMP in 2 weeks.       Return in about 6 months (around 11/15/2023).    Billie Lade, MD

## 2023-05-15 NOTE — Assessment & Plan Note (Signed)
Taking Creon as prescribed.

## 2023-05-15 NOTE — Assessment & Plan Note (Signed)
Etiology remains unclear.  He continues to endorse symptoms today.  Most recently, twice daily use of MiraLAX has been recommended.  He reports today that he is trying home remedies that are temporarily effective before symptoms return.  Last evaluated by gastroenterology in April.  EGD/colonoscopy discussed at that time but not completed. -Given persistent symptoms, I have recommended that Noah Henderson contact gastroenterology to arrange follow-up.

## 2023-05-15 NOTE — Assessment & Plan Note (Signed)
Lasix 20 mg daily as needed was prescribed at his last appointment for management of bilateral lower extremity edema.  Today he reports that he has been taking Lasix 20 mg daily.  No lower extremity edema is present on exam.  Hypokalemia noted on labs from yesterday. -I have recommended that Noah Henderson reduce Lasix use to as needed if edema is present

## 2023-05-15 NOTE — Assessment & Plan Note (Signed)
Followed by ID.  Seen for follow-up yesterday (8/22).  Remains on Symtuza.

## 2023-05-15 NOTE — Assessment & Plan Note (Signed)
Remains adequately controlled with metoprolol.  No medication changes are indicated today.

## 2023-05-15 NOTE — Assessment & Plan Note (Signed)
K+ 3.3 on labs from yesterday.  I suspect this is due to chronic loop diuretic use.  He will reduce Lasix use to as needed.  Repeat BMP in 2 weeks.

## 2023-05-15 NOTE — Patient Instructions (Signed)
It was a pleasure to see you today.  Thank you for giving [place] the opportunity to be involved in your care.  Below is a brief recap of your visit and next steps.  We will plan to see you again in 6 months.  Summary No medication changes today. I recommend taking lasix only when your legs are swollen Repeat chemistry panel in 2 weeks Follow up in 6 months

## 2023-05-15 NOTE — Assessment & Plan Note (Signed)
He is currently prescribed atorvastatin 40 mg daily.  Lipid panel updated earlier this week and reflects adequate control.  No medication changes are indicated.

## 2023-05-16 LAB — CBC WITH DIFFERENTIAL/PLATELET
Absolute Monocytes: 261 {cells}/uL (ref 200–950)
Basophils Absolute: 20 {cells}/uL (ref 0–200)
Basophils Relative: 0.5 %
Eosinophils Absolute: 90 {cells}/uL (ref 15–500)
Eosinophils Relative: 2.3 %
HCT: 33.6 % — ABNORMAL LOW (ref 38.5–50.0)
Hemoglobin: 12 g/dL — ABNORMAL LOW (ref 13.2–17.1)
Lymphs Abs: 788 {cells}/uL — ABNORMAL LOW (ref 850–3900)
MCH: 30.8 pg (ref 27.0–33.0)
MCHC: 35.7 g/dL (ref 32.0–36.0)
MCV: 86.4 fL (ref 80.0–100.0)
MPV: 11.2 fL (ref 7.5–12.5)
Monocytes Relative: 6.7 %
Neutro Abs: 2742 cells/uL (ref 1500–7800)
Neutrophils Relative %: 70.3 %
Platelets: 77 10*3/uL — ABNORMAL LOW (ref 140–400)
RBC: 3.89 10*6/uL — ABNORMAL LOW (ref 4.20–5.80)
RDW: 14.2 % (ref 11.0–15.0)
Total Lymphocyte: 20.2 %
WBC: 3.9 10*3/uL (ref 3.8–10.8)

## 2023-05-16 LAB — COMPLETE METABOLIC PANEL WITH GFR
AG Ratio: 1.6 (calc) (ref 1.0–2.5)
ALT: 22 U/L (ref 9–46)
AST: 37 U/L — ABNORMAL HIGH (ref 10–35)
Albumin: 3.9 g/dL (ref 3.6–5.1)
Alkaline phosphatase (APISO): 71 U/L (ref 35–144)
BUN: 13 mg/dL (ref 7–25)
CO2: 24 mmol/L (ref 20–32)
Calcium: 9.3 mg/dL (ref 8.6–10.3)
Chloride: 108 mmol/L (ref 98–110)
Creat: 0.99 mg/dL (ref 0.70–1.28)
Globulin: 2.4 g/dL (ref 1.9–3.7)
Glucose, Bld: 97 mg/dL (ref 65–99)
Potassium: 3.3 mmol/L — ABNORMAL LOW (ref 3.5–5.3)
Sodium: 141 mmol/L (ref 135–146)
Total Bilirubin: 1.1 mg/dL (ref 0.2–1.2)
Total Protein: 6.3 g/dL (ref 6.1–8.1)
eGFR: 82 mL/min/{1.73_m2} (ref >=60)

## 2023-05-16 LAB — LIPID PANEL
Cholesterol: 95 mg/dL (ref <200)
HDL: 33 mg/dL — ABNORMAL LOW (ref >=40)
LDL Cholesterol (Calc): 40 mg/dL
Non-HDL Cholesterol (Calc): 62 mg/dL (ref <130)
Total CHOL/HDL Ratio: 2.9 (calc) (ref <5.0)
Triglycerides: 135 mg/dL (ref <150)

## 2023-05-16 LAB — BASIC METABOLIC PANEL
BUN/Creatinine Ratio: 12 (ref 10–24)
BUN: 11 mg/dL (ref 8–27)
CO2: 24 mmol/L (ref 20–29)
Calcium: 9.2 mg/dL (ref 8.6–10.2)
Chloride: 108 mmol/L — ABNORMAL HIGH (ref 96–106)
Creatinine, Ser: 0.9 mg/dL (ref 0.76–1.27)
Glucose: 101 mg/dL — ABNORMAL HIGH (ref 70–99)
Potassium: 3.5 mmol/L (ref 3.5–5.2)
Sodium: 145 mmol/L — ABNORMAL HIGH (ref 134–144)
eGFR: 92 mL/min/{1.73_m2} (ref >59)

## 2023-05-16 LAB — HIV-1 RNA QUANT-NO REFLEX-BLD
HIV 1 RNA Quant: NOT DETECTED {copies}/mL
HIV-1 RNA Quant, Log: NOT DETECTED {Log_copies}/mL

## 2023-05-16 LAB — RPR: RPR Ser Ql: NONREACTIVE

## 2023-07-14 ENCOUNTER — Other Ambulatory Visit: Payer: Self-pay | Admitting: Internal Medicine

## 2023-07-14 DIAGNOSIS — M25473 Effusion, unspecified ankle: Secondary | ICD-10-CM

## 2023-07-14 DIAGNOSIS — I10 Essential (primary) hypertension: Secondary | ICD-10-CM

## 2023-07-14 DIAGNOSIS — K219 Gastro-esophageal reflux disease without esophagitis: Secondary | ICD-10-CM

## 2023-07-14 DIAGNOSIS — G4700 Insomnia, unspecified: Secondary | ICD-10-CM

## 2023-08-08 ENCOUNTER — Encounter: Payer: Self-pay | Admitting: Internal Medicine

## 2023-08-11 ENCOUNTER — Other Ambulatory Visit: Payer: Self-pay | Admitting: Internal Medicine

## 2023-08-11 DIAGNOSIS — K8689 Other specified diseases of pancreas: Secondary | ICD-10-CM

## 2023-09-09 ENCOUNTER — Other Ambulatory Visit: Payer: Self-pay | Admitting: Internal Medicine

## 2023-09-09 DIAGNOSIS — G4700 Insomnia, unspecified: Secondary | ICD-10-CM

## 2023-09-09 DIAGNOSIS — K219 Gastro-esophageal reflux disease without esophagitis: Secondary | ICD-10-CM

## 2023-09-09 DIAGNOSIS — I10 Essential (primary) hypertension: Secondary | ICD-10-CM

## 2023-10-03 ENCOUNTER — Other Ambulatory Visit (HOSPITAL_BASED_OUTPATIENT_CLINIC_OR_DEPARTMENT_OTHER): Payer: Self-pay

## 2023-11-12 ENCOUNTER — Other Ambulatory Visit: Payer: Self-pay | Admitting: Internal Medicine

## 2023-11-12 DIAGNOSIS — I10 Essential (primary) hypertension: Secondary | ICD-10-CM

## 2023-11-12 DIAGNOSIS — K219 Gastro-esophageal reflux disease without esophagitis: Secondary | ICD-10-CM

## 2023-11-12 DIAGNOSIS — M25473 Effusion, unspecified ankle: Secondary | ICD-10-CM

## 2023-11-12 DIAGNOSIS — G4700 Insomnia, unspecified: Secondary | ICD-10-CM

## 2023-11-13 ENCOUNTER — Encounter: Payer: Self-pay | Admitting: Internal Medicine

## 2023-11-17 NOTE — Progress Notes (Unsigned)
 Subjective:   Chief complaint: follow-up for HIV disease on medications    Patient ID: Noah Henderson, male    DOB: 11/21/1950, 73 y.o.   MRN: [id number]  HPI  Discussed the use of AI scribe software for clinical note transcription with the patient, who gave verbal consent to proceed.  History of Present Illness       The patient, with a history of HIV, hypertension, pancreatic insufficiency, cirrhosis of the liver, and hyperlipidemia, presents for a routine follow-up. He is currently on metoprolol for hypertension, Creon for pancreatic insufficiency, atorvastatin for hyperlipidemia, and Lasix for leg swelling., Symtuza for his HIV. He also takes potassium supplements to counteract the hypokalemia caused by Lasix. He has been experiencing constipation, for which he is taking MiraLAX.  The patient has been considering colorectal cancer screening. He had previously attempted to get a Cologuard, but found the collection process complicated. He is hesitant about undergoing a colonoscopy. He had heard about a test called "Shield" and was interested in exploring it as a potential screening option.  The patient also has cirrhosis of the liver, which is being monitored by his primary care doctor. He does not have a gastroenterologist or hepatologist managing his liver condition.  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  Family History  Problem Relation Age of Onset   Heart disease Mother    Breast cancer Mother    Heart disease Father    Mental illness Father    Heart attack Father    Heart disease Brother    Colon cancer Neg Hx    Prostate cancer Neg Hx       Social History   Socioeconomic History   Marital status: Single    Spouse name: Not on file   Number of children: Not on file   Years of education: Not on file   Highest education level: Not on file  Occupational History   Not on file  Tobacco Use   Smoking status: Never    Passive exposure: Never   Smokeless tobacco: Never  Substance and Sexual Activity   Alcohol use: No   Drug use: No   Sexual activity: Yes    Partners: Male    Birth control/protection: Condom    Comment: accepted condoms  Other Topics Concern   Not on file  Social History Narrative   Lives with a partner. Patient is retired.    Social Drivers of Health   Financial Resource Strain: Low Risk  (03/18/2023)   Overall Financial Resource Strain (CARDIA)    Difficulty of Paying Living Expenses: Not hard at all  Food Insecurity: No Food Insecurity (03/18/2023)  Hunger Vital Sign    Worried About Running Out of Food in the Last Year: Never true    Ran Out of Food in the Last Year: Never true  Transportation Needs: No Transportation Needs (03/18/2023)   PRAPARE - Administrator, Civil Service (Medical): No    Lack of Transportation (Non-Medical): No  Physical Activity: Sufficiently Active (03/18/2023)   Exercise Vital Sign    Days of Exercise per Week: 7 days    Minutes of Exercise per Session: 30 min  Stress: No Stress Concern Present (03/18/2023)   [employer] of Occupational Health - Occupational Stress Questionnaire    Feeling  of Stress : Not at all  Social Connections: Socially Isolated (03/18/2023)   Social Connection and Isolation Panel [NHANES]    Frequency of Communication with Friends and Family: More than three times a week    Frequency of Social Gatherings with Friends and Family: More than three times a week    Attends Religious Services: Never    Database administrator or Organizations: No    Attends Engineer, structural: Never    Marital Status: Never married    Allergies  Allergen Reactions   Penicillins      Current Outpatient Medications:    amitriptyline (ELAVIL) 50 MG tablet, TAKE 1 TABLET(50 MG) BY MOUTH AT BEDTIME, Disp: 30 tablet, Rfl: 1   atorvastatin (LIPITOR) 40 MG tablet, Take 1 tablet (40 mg total) by mouth daily., Disp: 30 tablet, Rfl: 11   CREON 24000-76000 units CPEP, TAKE 2 CAPSULES(48,000 UNITS) BY MOUTH THREE TIMES DAILY, Disp: 200 capsule, Rfl: 3   Darunavir-Cobicistat-Emtricitabine-Tenofovir Alafenamide (SYMTUZA) 800-150-200-10 MG TABS, Take 1 tablet by mouth daily with breakfast., Disp: 30 tablet, Rfl: 11   Ensure (ENSURE), Take 1 Can by mouth 2 (two) times daily between meals for 30 doses. For aids wasting syndrome crhonic pancreatitis, Disp: 7110 mL, Rfl: 5   furosemide (LASIX) 20 MG tablet, TAKE 1 TABLET(20 MG) BY MOUTH DAILY AS NEEDED FOR SWELLING OR LOWER EXTREMITY SWELLING, Disp: 30 tablet, Rfl: 3   loratadine (CLARITIN) 10 MG tablet, Take 1 tablet (10 mg total) by mouth daily., Disp: 30 tablet, Rfl: 11   metoprolol succinate (TOPROL-XL) 100 MG 24 hr tablet, TAKE 1 TABLET BY MOUTH DAILY WITH OR IMMEDIATELY FOLLOWING A MEAL, Disp: 30 tablet, Rfl: 1   omeprazole (PRILOSEC) 40 MG capsule, TAKE 1 CAPSULE BY MOUTH EVERY DAY, Disp: 30 capsule, Rfl: 1   sildenafil (VIAGRA) 25 MG tablet, Take 1 tablet (25 mg total) by mouth daily as needed for erectile dysfunction., Disp: 10 tablet, Rfl: 5   Review of Systems  Constitutional:  Negative for activity change, appetite  change, chills, diaphoresis, fatigue, fever and unexpected weight change.  HENT:  Negative for congestion, rhinorrhea, sinus pressure, sneezing, sore throat and trouble swallowing.   Eyes:  Negative for photophobia and visual disturbance.  Respiratory:  Negative for cough, chest tightness, shortness of breath, wheezing and stridor.   Cardiovascular:  Negative for chest pain, palpitations and leg swelling.  Gastrointestinal:  Negative for abdominal distention, abdominal pain, anal bleeding, blood in stool, constipation, diarrhea, nausea and vomiting.  Genitourinary:  Negative for difficulty urinating, dysuria, flank pain and hematuria.  Musculoskeletal:  Negative for arthralgias, back pain, gait problem, joint swelling and myalgias.  Skin:  Negative for color change, pallor, rash and wound.  Neurological:  Negative for dizziness, tremors, weakness and light-headedness.  Hematological:  Negative for adenopathy. Does not bruise/bleed easily.  Psychiatric/Behavioral:  Negative for agitation, behavioral problems, confusion, decreased concentration, dysphoric mood and sleep disturbance.        Objective:   Physical Exam Constitutional:      Appearance: He is well-developed.  HENT:     Head: Normocephalic and atraumatic.  Eyes:     Conjunctiva/sclera: Conjunctivae normal.  Cardiovascular:     Rate and Rhythm: Normal rate and regular rhythm.  Pulmonary:     Effort: Pulmonary effort is normal. No respiratory distress.     Breath sounds: No wheezing.  Abdominal:     General: There is no distension.     Palpations: Abdomen is soft.  Musculoskeletal:        General: No tenderness. Normal range of motion.     Cervical back: Normal range of motion and neck supple.  Skin:    General: Skin is warm and dry.     Coloration: Skin is not pale.     Findings: No erythema or rash.  Neurological:     General: No focal deficit present.     Mental Status: He is alert and oriented to person, place, and  time.  Psychiatric:        Mood and Affect: Mood normal.        Behavior: Behavior normal.        Thought Content: Thought content normal.        Judgment: Judgment normal.           Assessment & Plan:   HIV disease:   check HIV RNA, CD4 routine labs continue Symtuza  Colon Cancer Screening: Patient interested in stool-based screening test. Discussed the limitations of stool-based testing and the benefits of colonoscopy for definitive screening. -Recommended patient discuss colonoscopy and this other test with his PCP  Hypertension: Managed with metoprolol. -Continue current management.  Pancreatic Insufficiency: Managed with Creon. -Continue current management.  Constipation: Managed with Miralax. -Continue current management.  Hyperlipidemia: Managed with atorvastatin. -Continue current management.  Lower Extremity Edema: Managed with Lasix and potassium supplementation. -Continue current management.  Cirrhosis: Managed by primary care provider. -Continue current management.  General Health Maintenance: -Recommended COVID-19 vaccine, to be obtained at a pharmacy. -Follow-up in 6 months.

## 2023-11-18 ENCOUNTER — Other Ambulatory Visit: Payer: Self-pay

## 2023-11-18 ENCOUNTER — Ambulatory Visit (INDEPENDENT_AMBULATORY_CARE_PROVIDER_SITE_OTHER): Payer: Medicare Other | Admitting: Infectious Disease

## 2023-11-18 ENCOUNTER — Other Ambulatory Visit (HOSPITAL_COMMUNITY)
Admission: RE | Admit: 2023-11-18 | Discharge: 2023-11-18 | Disposition: A | Discharge status: Home / Self Care | Payer: Medicare Other | Source: Ambulatory Visit | Attending: Infectious Disease | Admitting: Infectious Disease

## 2023-11-18 ENCOUNTER — Encounter: Payer: Self-pay | Admitting: Infectious Disease

## 2023-11-18 VITALS — BP 149/74 | HR 64 | Temp 99.0°F | Ht 68.0 in | Wt 164.0 lb

## 2023-11-18 DIAGNOSIS — Z1212 Encounter for screening for malignant neoplasm of rectum: Secondary | ICD-10-CM

## 2023-11-18 DIAGNOSIS — B20 Human immunodeficiency virus [HIV] disease: Secondary | ICD-10-CM

## 2023-11-18 DIAGNOSIS — E785 Hyperlipidemia, unspecified: Secondary | ICD-10-CM | POA: Diagnosis not present

## 2023-11-18 DIAGNOSIS — K861 Other chronic pancreatitis: Secondary | ICD-10-CM

## 2023-11-18 DIAGNOSIS — Z1211 Encounter for screening for malignant neoplasm of colon: Secondary | ICD-10-CM

## 2023-11-18 DIAGNOSIS — E881 Lipodystrophy, not elsewhere classified: Secondary | ICD-10-CM | POA: Insufficient documentation

## 2023-11-18 DIAGNOSIS — K7460 Unspecified cirrhosis of liver: Secondary | ICD-10-CM | POA: Diagnosis not present

## 2023-11-18 DIAGNOSIS — I10 Essential (primary) hypertension: Secondary | ICD-10-CM | POA: Diagnosis not present

## 2023-11-18 MED ORDER — SYMTUZA 800-150-200-10 MG PO TABS: 1.0000 | ORAL_TABLET | Freq: Every day | ORAL | 11 refills | Status: DC

## 2023-11-19 LAB — URINE CYTOLOGY ANCILLARY ONLY
Chlamydia: NEGATIVE
Comment: NEGATIVE
Comment: NORMAL
Neisseria Gonorrhea: NEGATIVE

## 2023-11-20 ENCOUNTER — Ambulatory Visit: Payer: Medicare Other | Admitting: Internal Medicine

## 2023-11-20 LAB — T-HELPER CELLS (CD4) COUNT (NOT AT ARMC)
CD4 % Helper T Cell: 38 % (ref 33–65)
CD4 T Cell Abs: 349 /uL — ABNORMAL LOW (ref 400–1790)

## 2023-11-21 LAB — CBC WITH DIFFERENTIAL/PLATELET
Absolute Lymphocytes: 1026 {cells}/uL (ref 850–3900)
Absolute Monocytes: 342 {cells}/uL (ref 200–950)
Basophils Absolute: 32 {cells}/uL (ref 0–200)
Basophils Relative: 0.7 %
Eosinophils Absolute: 113 {cells}/uL (ref 15–500)
Eosinophils Relative: 2.5 %
HCT: 35 % — ABNORMAL LOW (ref 38.5–50.0)
Hemoglobin: 12.2 g/dL — ABNORMAL LOW (ref 13.2–17.1)
MCH: 31.4 pg (ref 27.0–33.0)
MCHC: 34.9 g/dL (ref 32.0–36.0)
MCV: 90.2 fL (ref 80.0–100.0)
MPV: 10.6 fL (ref 7.5–12.5)
Monocytes Relative: 7.6 %
Neutro Abs: 2988 {cells}/uL (ref 1500–7800)
Neutrophils Relative %: 66.4 %
Platelets: 74 10*3/uL — ABNORMAL LOW (ref 140–400)
RBC: 3.88 10*6/uL — ABNORMAL LOW (ref 4.20–5.80)
RDW: 13.9 % (ref 11.0–15.0)
Total Lymphocyte: 22.8 %
WBC: 4.5 10*3/uL (ref 3.8–10.8)

## 2023-11-21 LAB — COMPLETE METABOLIC PANEL WITH GFR
AG Ratio: 1.4 (calc) (ref 1.0–2.5)
ALT: 17 U/L (ref 9–46)
AST: 20 U/L (ref 10–35)
Albumin: 3.9 g/dL (ref 3.6–5.1)
Alkaline phosphatase (APISO): 71 U/L (ref 35–144)
BUN: 13 mg/dL (ref 7–25)
CO2: 25 mmol/L (ref 20–32)
Calcium: 9.1 mg/dL (ref 8.6–10.3)
Chloride: 109 mmol/L (ref 98–110)
Creat: 0.95 mg/dL (ref 0.70–1.28)
Globulin: 2.7 g/dL (ref 1.9–3.7)
Glucose, Bld: 94 mg/dL (ref 65–99)
Potassium: 3.3 mmol/L — ABNORMAL LOW (ref 3.5–5.3)
Sodium: 142 mmol/L (ref 135–146)
Total Bilirubin: 1.3 mg/dL — ABNORMAL HIGH (ref 0.2–1.2)
Total Protein: 6.6 g/dL (ref 6.1–8.1)
eGFR: 86 mL/min/{1.73_m2} (ref >=60)

## 2023-11-21 LAB — LIPID PANEL
Cholesterol: 114 mg/dL (ref <200)
HDL: 38 mg/dL — ABNORMAL LOW (ref >=40)
LDL Cholesterol (Calc): 55 mg/dL
Non-HDL Cholesterol (Calc): 76 mg/dL (ref <130)
Total CHOL/HDL Ratio: 3 (calc) (ref <5.0)
Triglycerides: 119 mg/dL (ref <150)

## 2023-11-21 LAB — RPR: RPR Ser Ql: NONREACTIVE

## 2023-11-21 LAB — HIV RNA, RTPCR W/R GT (RTI, PI,INT)
HIV 1 RNA Quant: NOT DETECTED {copies}/mL
HIV-1 RNA Quant, Log: NOT DETECTED {Log_copies}/mL

## 2023-12-11 ENCOUNTER — Ambulatory Visit (INDEPENDENT_AMBULATORY_CARE_PROVIDER_SITE_OTHER): Payer: Medicare Other | Admitting: Internal Medicine

## 2023-12-11 ENCOUNTER — Other Ambulatory Visit: Payer: Self-pay | Admitting: Internal Medicine

## 2023-12-11 VITALS — BP 134/69 | HR 76 | Ht 68.0 in | Wt 161.0 lb

## 2023-12-11 DIAGNOSIS — K5904 Chronic idiopathic constipation: Secondary | ICD-10-CM

## 2023-12-11 DIAGNOSIS — Z1321 Encounter for screening for nutritional disorder: Secondary | ICD-10-CM | POA: Diagnosis not present

## 2023-12-11 DIAGNOSIS — E876 Hypokalemia: Secondary | ICD-10-CM | POA: Diagnosis not present

## 2023-12-11 DIAGNOSIS — Z131 Encounter for screening for diabetes mellitus: Secondary | ICD-10-CM

## 2023-12-11 DIAGNOSIS — I10 Essential (primary) hypertension: Secondary | ICD-10-CM

## 2023-12-11 DIAGNOSIS — B20 Human immunodeficiency virus [HIV] disease: Secondary | ICD-10-CM

## 2023-12-11 DIAGNOSIS — E559 Vitamin D deficiency, unspecified: Secondary | ICD-10-CM | POA: Diagnosis not present

## 2023-12-11 DIAGNOSIS — G4700 Insomnia, unspecified: Secondary | ICD-10-CM

## 2023-12-11 DIAGNOSIS — Z1329 Encounter for screening for other suspected endocrine disorder: Secondary | ICD-10-CM | POA: Diagnosis not present

## 2023-12-11 DIAGNOSIS — K8689 Other specified diseases of pancreas: Secondary | ICD-10-CM | POA: Diagnosis not present

## 2023-12-11 DIAGNOSIS — R600 Localized edema: Secondary | ICD-10-CM | POA: Diagnosis not present

## 2023-12-11 MED ORDER — POTASSIUM CHLORIDE CRYS ER 10 MEQ PO TBCR: 10.0000 meq | EXTENDED_RELEASE_TABLET | Freq: Every day | ORAL | 2 refills | Status: DC

## 2023-12-11 NOTE — Assessment & Plan Note (Signed)
 He remains on Creon

## 2023-12-11 NOTE — Patient Instructions (Signed)
 It was a pleasure to see you today.  Thank you for giving [place] the opportunity to be involved in your care.  Below is a brief recap of your visit and next steps.  We will plan to see you again in 6 months.  Summary Start daily potassium supplementation Update labs  Follow up in 6 months I will look into the [facility] test for colon cancer screening

## 2023-12-11 NOTE — Assessment & Plan Note (Addendum)
 Recently seen by ID for follow-up.  He remains on Symtuza.  CD4 count stable.

## 2023-12-11 NOTE — Assessment & Plan Note (Signed)
 Chronic issue.  He endorses persistent constipation again today.  He has messaged gastroenterology about his symptoms and was instructed to use a combination of MiraLAX and Gatorade, which has been somewhat effective.  He plans to contact gastroenterology to arrange follow-up.

## 2023-12-11 NOTE — Assessment & Plan Note (Signed)
 Remains adequately controlled with Toprol-XL.  No medication changes are indicated today.

## 2023-12-11 NOTE — Progress Notes (Signed)
 Established Patient Office Visit  Subjective   Patient ID: Noah Henderson, male    DOB: March 07, 1951  Age: 73 y.o. MRN: [id number]  Chief Complaint  Patient presents with   Constipation    Patient is constantly constipated    potassium    Low potassium    Leg Swelling    Swelling in both legs    Noah Henderson returns to care today for routine follow-up.  He was last evaluated by me in August 2024.  At that time he endorsed persistent constipation.  I recommended that he contact gastroenterology to arrange follow-up.  No additional medication changes were made and 65-month follow-up was arranged.  In the interim, he has been seen by ID for follow-up.  There have otherwise been no acute interval events.  Noah Henderson reports feeling fairly well today.  He endorses persistent constipation and plans to contact gastroenterology to arrange follow-up.  He does not have any acute concerns to discuss today.  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 No pertinent surgical history. Social History   Tobacco Use   Smoking status: Never    Passive exposure: Never   Smokeless tobacco: Never  Substance Use Topics   Alcohol use: No   Drug  use: No   Family History  Problem Relation Age of Onset   Heart disease Mother    Breast cancer Mother    Heart disease Father    Mental illness Father    Heart attack Father    Heart disease Brother    Colon cancer Neg Hx    Prostate cancer Neg Hx    Allergies  Allergen Reactions   Penicillins    Review of Systems  Constitutional:  Negative for chills and fever.  HENT:  Negative for sore throat.   Respiratory:  Negative for cough and shortness of breath.   Cardiovascular:  Negative for chest pain, palpitations and leg swelling.  Gastrointestinal:  Positive for constipation. Negative for abdominal pain, blood in stool, diarrhea, nausea and vomiting.  Genitourinary:  Negative for dysuria and hematuria.  Musculoskeletal:  Negative for myalgias.  Skin:  Negative for itching and rash.  Neurological:  Negative for dizziness and headaches.  Psychiatric/Behavioral:  Negative for depression and suicidal ideas.      Objective:     BP 134/69 (BP Location: Right Arm, Patient Position: Sitting, Cuff Size: Normal)   Pulse 76   Ht 5\' 8"  (1.727 m)   Wt 161 lb (73 kg)   SpO2 94%   BMI 24.48 kg/m  BP Readings from Last 3 Encounters:  12/11/23 134/69  11/18/23 (!) 149/74  05/15/23 129/69   Physical Exam Vitals reviewed.  Constitutional:  General: He is not in acute distress.    Appearance: Normal appearance. He is not ill-appearing.  HENT:     Head: Normocephalic and atraumatic.     Right Ear: External ear normal.     Left Ear: External ear normal.     Nose: Nose normal. No congestion or rhinorrhea.     Mouth/Throat:     Mouth: Mucous membranes are moist.     Pharynx: Oropharynx is clear.  Eyes:     General: No scleral icterus.    Extraocular Movements: Extraocular movements intact.     Conjunctiva/sclera: Conjunctivae normal.     Pupils: Pupils are equal, round, and reactive to light.  Cardiovascular:     Rate and Rhythm: Normal rate and regular rhythm.     Pulses:  Normal pulses.     Heart sounds: Normal heart sounds. No murmur heard. Pulmonary:     Effort: Pulmonary effort is normal.     Breath sounds: Normal breath sounds. No wheezing, rhonchi or rales.  Abdominal:     General: Abdomen is flat. Bowel sounds are normal. There is no distension.     Palpations: Abdomen is soft.     Tenderness: There is no abdominal tenderness.  Musculoskeletal:        General: No swelling or deformity. Normal range of motion.     Cervical back: Normal range of motion.  Skin:    General: Skin is warm and dry.     Capillary Refill: Capillary refill takes less than 2 seconds.  Neurological:     General: No focal deficit present.     Mental Status: He is alert and oriented to person, place, and time.     Motor: No weakness.  Psychiatric:        Mood and Affect: Mood normal.        Behavior: Behavior normal.        Thought Content: Thought content normal.   Last CBC Lab Results  Component Value Date   WBC 4.5 11/18/2023   HGB 12.2 (L) 11/18/2023   HCT 35.0 (L) 11/18/2023   MCV 90.2 11/18/2023   MCH 31.4 11/18/2023   RDW 13.9 11/18/2023   PLT 74 (L) 11/18/2023   Last metabolic panel Lab Results  Component Value Date   GLUCOSE 94 11/18/2023   NA 142 11/18/2023   K 3.3 (L) 11/18/2023   CL 109 11/18/2023   CO2 25 11/18/2023   BUN 13 11/18/2023   CREATININE 0.95 11/18/2023   EGFR 86 11/18/2023   CALCIUM 9.1 11/18/2023   PHOS 3.1 07/05/2020   PROT 6.6 11/18/2023   ALBUMIN 3.8 03/24/2017   BILITOT 1.3 (H) 11/18/2023   ALKPHOS 75 03/24/2017   AST 20 11/18/2023   ALT 17 11/18/2023   Last lipids Lab Results  Component Value Date   CHOL 114 11/18/2023   HDL 38 (L) 11/18/2023   LDLCALC 55 11/18/2023   TRIG 119 11/18/2023   CHOLHDL 3.0 11/18/2023   Last hemoglobin A1c Lab Results  Component Value Date   HGBA1C 5.1 06/16/2022   Last thyroid functions Lab Results  Component Value Date   TSH 2.660 06/16/2022   T4TOTAL 5.7 03/26/2016   Last  vitamin D Lab Results  Component Value Date   VD25OH 30.2 06/16/2022   Last vitamin B12 and Folate Lab Results  Component Value Date   VITAMINB12 724 06/16/2022   FOLATE 7.5 01/12/2023     Assessment & Plan:   Problem List Items Addressed This  Visit       HTN (hypertension) (Chronic)   Remains adequately controlled with Toprol-XL.  No medication changes are indicated today.      Pancreatic insufficiency   He remains on Creon      Chronic idiopathic constipation   Chronic issue.  He endorses persistent constipation again today.  He has messaged gastroenterology about his symptoms and was instructed to use a combination of MiraLAX and Gatorade, which has been somewhat effective.  He plans to contact gastroenterology to arrange follow-up.      Human immunodeficiency virus (HIV) disease (HCC)   Recently seen by ID for follow-up.  He remains on Symtuza.  CD4 count stable.      Insomnia   Symptoms remain well-controlled with amitriptyline.      Lower extremity edema   Minimal edema noted on exam today.  He is currently using Lasix 20 mg 1-2 times weekly as needed for edema.      Hypokalemia - Primary   Noted on recent labs.  Chronic issue.  Will start low-dose, daily potassium supplementation.  Repeat BMP ordered today.      Return in about 6 months (around 06/12/2024).   Billie Lade, MD

## 2023-12-11 NOTE — Assessment & Plan Note (Addendum)
 Noted on recent labs.  Chronic issue.  Will start low-dose, daily potassium supplementation.  Repeat BMP ordered today.

## 2023-12-11 NOTE — Assessment & Plan Note (Signed)
 Minimal edema noted on exam today.  He is currently using Lasix 20 mg 1-2 times weekly as needed for edema.

## 2023-12-11 NOTE — Assessment & Plan Note (Signed)
 Symptoms remain well-controlled with amitriptyline.

## 2023-12-12 ENCOUNTER — Encounter: Payer: Self-pay | Admitting: Internal Medicine

## 2023-12-13 LAB — BASIC METABOLIC PANEL
BUN/Creatinine Ratio: 9 — ABNORMAL LOW (ref 10–24)
BUN: 9 mg/dL (ref 8–27)
CO2: 19 mmol/L — ABNORMAL LOW (ref 20–29)
Calcium: 9.1 mg/dL (ref 8.6–10.2)
Chloride: 108 mmol/L — ABNORMAL HIGH (ref 96–106)
Creatinine, Ser: 0.99 mg/dL (ref 0.76–1.27)
Glucose: 104 mg/dL — ABNORMAL HIGH (ref 70–99)
Potassium: 3.8 mmol/L (ref 3.5–5.2)
Sodium: 143 mmol/L (ref 134–144)
eGFR: 82 mL/min/{1.73_m2} (ref >59)

## 2023-12-13 LAB — HEMOGLOBIN A1C
Est. average glucose Bld gHb Est-mCnc: 94 mg/dL
Hgb A1c MFr Bld: 4.9 % (ref 4.8–5.6)

## 2023-12-13 LAB — TSH+FREE T4
Free T4: 1.14 ng/dL (ref 0.82–1.77)
TSH: 2.86 u[IU]/mL (ref 0.450–4.500)

## 2023-12-13 LAB — VITAMIN D 25 HYDROXY (VIT D DEFICIENCY, FRACTURES): Vit D, 25-Hydroxy: 45.4 ng/mL (ref 30.0–100.0)

## 2023-12-13 LAB — B12 AND FOLATE PANEL
Folate: 4.1 ng/mL (ref >3.0)
Vitamin B-12: 676 pg/mL (ref 232–1245)

## 2023-12-16 ENCOUNTER — Other Ambulatory Visit: Payer: Self-pay | Admitting: Internal Medicine

## 2023-12-16 DIAGNOSIS — M25473 Effusion, unspecified ankle: Secondary | ICD-10-CM

## 2024-01-15 ENCOUNTER — Other Ambulatory Visit: Payer: Self-pay | Admitting: Internal Medicine

## 2024-01-15 DIAGNOSIS — I10 Essential (primary) hypertension: Secondary | ICD-10-CM

## 2024-01-15 DIAGNOSIS — G4700 Insomnia, unspecified: Secondary | ICD-10-CM

## 2024-01-15 DIAGNOSIS — K219 Gastro-esophageal reflux disease without esophagitis: Secondary | ICD-10-CM

## 2024-02-01 NOTE — Progress Notes (Signed)
 The ASCVD Risk score (Arnett DK, et al., 2019) failed to calculate for the following reasons:   The valid total cholesterol range is 130 to 320 mg/dL  Arlon Bergamo, BSN, RN

## 2024-03-17 ENCOUNTER — Other Ambulatory Visit: Payer: Self-pay | Admitting: Internal Medicine

## 2024-03-17 DIAGNOSIS — E876 Hypokalemia: Secondary | ICD-10-CM

## 2024-03-17 DIAGNOSIS — G4700 Insomnia, unspecified: Secondary | ICD-10-CM

## 2024-03-17 DIAGNOSIS — K219 Gastro-esophageal reflux disease without esophagitis: Secondary | ICD-10-CM

## 2024-03-17 DIAGNOSIS — I10 Essential (primary) hypertension: Secondary | ICD-10-CM

## 2024-03-24 ENCOUNTER — Other Ambulatory Visit: Payer: Self-pay | Admitting: Infectious Disease

## 2024-03-24 ENCOUNTER — Other Ambulatory Visit: Payer: Self-pay

## 2024-03-24 DIAGNOSIS — K219 Gastro-esophageal reflux disease without esophagitis: Secondary | ICD-10-CM

## 2024-03-24 DIAGNOSIS — I10 Essential (primary) hypertension: Secondary | ICD-10-CM

## 2024-04-22 ENCOUNTER — Other Ambulatory Visit: Payer: Self-pay

## 2024-04-22 DIAGNOSIS — E876 Hypokalemia: Secondary | ICD-10-CM

## 2024-04-26 ENCOUNTER — Other Ambulatory Visit: Payer: Self-pay

## 2024-04-26 DIAGNOSIS — M25473 Effusion, unspecified ankle: Secondary | ICD-10-CM

## 2024-04-26 DIAGNOSIS — K8689 Other specified diseases of pancreas: Secondary | ICD-10-CM

## 2024-05-18 ENCOUNTER — Ambulatory Visit: Payer: Medicare Other | Admitting: Infectious Disease

## 2024-05-24 ENCOUNTER — Other Ambulatory Visit: Payer: Self-pay

## 2024-05-24 ENCOUNTER — Other Ambulatory Visit: Payer: Self-pay | Admitting: Infectious Disease

## 2024-05-24 DIAGNOSIS — G4700 Insomnia, unspecified: Secondary | ICD-10-CM

## 2024-05-24 DIAGNOSIS — I10 Essential (primary) hypertension: Secondary | ICD-10-CM

## 2024-05-31 NOTE — Progress Notes (Unsigned)
 Chief complaint: follow-up for HIV disease on medications  Subjective:    Patient ID: Noah Henderson, male    DOB: 1951-09-21, 73 y.o.   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  Family History  Problem Relation Age of Onset   Heart disease Mother    Breast cancer Mother    Heart disease Father    Mental illness Father    Heart attack Father    Heart disease Brother    Colon cancer Neg Hx    Prostate cancer Neg Hx       Social History   Socioeconomic History   Marital status: Single    Spouse name: Not on file   Number of children: Not on file   Years of education: Not on file   Highest education level: Associate degree: academic program  Occupational History   Not on file  Tobacco Use   Smoking status: Never    Passive exposure: Never   Smokeless tobacco: Never  Substance and Sexual Activity   Alcohol use: No   Drug use: No   Sexual activity: Yes    Partners: Male    Birth control/protection: Condom    Comment: accepted condoms  Other Topics Concern   Not on file  Social History Narrative   Lives with a partner. Patient is retired.    Social Drivers  of Corporate investment banker Strain: Low Risk  (12/11/2023)   Overall Financial Resource Strain (CARDIA)    Difficulty of Paying Living Expenses: Not very hard  Food Insecurity: No Food Insecurity (12/11/2023)   Hunger Vital Sign    Worried About Running Out of Food in the Last Year: Never true    Ran Out of Food in the Last Year: Never true  Transportation Needs: No Transportation Needs (12/11/2023)   PRAPARE - Administrator, Civil Service (Medical): No    Lack of Transportation (Non-Medical): No  Physical Activity: Insufficiently Active (12/11/2023)   Exercise Vital Sign    Days of Exercise per Week: 2 days    Minutes of Exercise per Session: 10 min  Stress: No Stress Concern Present (12/11/2023)   [employer] of Occupational Health - Occupational Stress Questionnaire    Feeling of Stress : Not at all  Social Connections: Socially Isolated (12/11/2023)   Social Connection and Isolation Panel    Frequency of Communication with Friends and Family: Once a week    Frequency of Social Gatherings with Friends and Family: Once a week    Attends Religious Services: 1 to 4 times per year    Active Member of [employer] or Organizations:  No    Attends Banker Meetings: Never    Marital Status: Never married    Allergies  Allergen Reactions   Penicillins      Current Outpatient Medications:    amitriptyline  (ELAVIL ) 50 MG tablet, TAKE 1 TABLET(50 MG) BY MOUTH AT BEDTIME, Disp: 30 tablet, Rfl: 1   atorvastatin  (LIPITOR) 40 MG tablet, TAKE 1 TABLET(40 MG) BY MOUTH DAILY, Disp: 30 tablet, Rfl: 0   CREON  24000-76000 units CPEP, TAKE 2 CAPSULES(48,000 UNITS) BY MOUTH THREE TIMES DAILY, Disp: 200 capsule, Rfl: 3   Darunavir -Cobicistat -Emtricitabine -Tenofovir  Alafenamide (SYMTUZA ) 800-150-200-10 MG TABS, Take 1 tablet by mouth daily with breakfast., Disp: 30 tablet, Rfl: 11   Ensure (ENSURE), Take 1 Can by mouth 2 (two) times daily between meals for 30 doses. For aids  wasting syndrome crhonic pancreatitis, Disp: 7110 mL, Rfl: 5   furosemide  (LASIX ) 20 MG tablet, TAKE 1 TABLET(20 MG) BY MOUTH DAILY AS NEEDED FOR SWELLING OR LOWER EXTREMITY SWELLING, Disp: 30 tablet, Rfl: 3   loratadine  (CLARITIN ) 10 MG tablet, Take 1 tablet (10 mg total) by mouth daily., Disp: 30 tablet, Rfl: 11   metoprolol  succinate (TOPROL -XL) 100 MG 24 hr tablet, TAKE 1 TABLET BY MOUTH DAILY WITH OR IMMEDIATELY FOLLOWING A MEAL, Disp: 30 tablet, Rfl: 1   omeprazole  (PRILOSEC) 40 MG capsule, TAKE 1 CAPSULE BY MOUTH EVERY DAY, Disp: 30 capsule, Rfl: 1   potassium chloride  (KLOR-CON  M) 10 MEQ tablet, TAKE 1 TABLET(10 MEQ) BY MOUTH DAILY, Disp: 90 tablet, Rfl: 1   sildenafil  (VIAGRA ) 25 MG tablet, Take 1 tablet (25 mg total) by mouth daily as needed for erectile dysfunction., Disp: 10 tablet, Rfl: 5   Review of Systems     Objective:   Physical Exam        Assessment & Plan:

## 2024-06-01 ENCOUNTER — Other Ambulatory Visit: Payer: Self-pay

## 2024-06-01 ENCOUNTER — Ambulatory Visit

## 2024-06-01 ENCOUNTER — Other Ambulatory Visit (HOSPITAL_COMMUNITY)
Admission: RE | Admit: 2024-06-01 | Discharge: 2024-06-01 | Disposition: A | Discharge status: Home / Self Care | Source: Ambulatory Visit | Attending: Infectious Disease | Admitting: Infectious Disease

## 2024-06-01 ENCOUNTER — Ambulatory Visit (INDEPENDENT_AMBULATORY_CARE_PROVIDER_SITE_OTHER): Admitting: Infectious Disease

## 2024-06-01 VITALS — BP 134/74 | HR 60 | Temp 98.0°F | Wt 147.0 lb

## 2024-06-01 DIAGNOSIS — K7460 Unspecified cirrhosis of liver: Secondary | ICD-10-CM

## 2024-06-01 DIAGNOSIS — B20 Human immunodeficiency virus [HIV] disease: Secondary | ICD-10-CM

## 2024-06-01 DIAGNOSIS — R768 Other specified abnormal immunological findings in serum: Secondary | ICD-10-CM | POA: Insufficient documentation

## 2024-06-01 DIAGNOSIS — E785 Hyperlipidemia, unspecified: Secondary | ICD-10-CM

## 2024-06-01 DIAGNOSIS — K8689 Other specified diseases of pancreas: Secondary | ICD-10-CM | POA: Diagnosis not present

## 2024-06-01 DIAGNOSIS — Z23 Encounter for immunization: Secondary | ICD-10-CM

## 2024-06-01 DIAGNOSIS — I10 Essential (primary) hypertension: Secondary | ICD-10-CM

## 2024-06-01 MED ORDER — SYMTUZA 800-150-200-10 MG PO TABS
1.0000 | ORAL_TABLET | Freq: Every day | ORAL | 11 refills | Status: DC
Start: 2024-06-01 — End: 2024-07-27

## 2024-06-01 MED ORDER — COVID-19 MRNA VACC (MODERNA) 50 MCG/0.5ML IM SUSY: 0.5000 mL | PREFILLED_SYRINGE | Freq: Once | INTRAMUSCULAR | 0 refills | Status: AC

## 2024-06-01 MED ORDER — ATORVASTATIN CALCIUM 40 MG PO TABS: 40.0000 mg | ORAL_TABLET | Freq: Every day | ORAL | 11 refills | Status: DC

## 2024-06-02 LAB — URINE CYTOLOGY ANCILLARY ONLY
Chlamydia: NEGATIVE
Comment: NEGATIVE
Comment: NORMAL
Neisseria Gonorrhea: NEGATIVE

## 2024-06-02 LAB — T-HELPER CELLS (CD4) COUNT (NOT AT ARMC)
CD4 % Helper T Cell: 35 % (ref 33–65)
CD4 T Cell Abs: 329 /uL — ABNORMAL LOW (ref 400–1790)

## 2024-06-03 ENCOUNTER — Other Ambulatory Visit: Payer: Self-pay

## 2024-06-03 DIAGNOSIS — K219 Gastro-esophageal reflux disease without esophagitis: Secondary | ICD-10-CM

## 2024-06-04 LAB — CBC WITH DIFFERENTIAL/PLATELET
Absolute Lymphocytes: 925 {cells}/uL (ref 850–3900)
Absolute Monocytes: 239 {cells}/uL (ref 200–950)
Basophils Absolute: 32 {cells}/uL (ref 0–200)
Basophils Relative: 0.7 %
Eosinophils Absolute: 129 {cells}/uL (ref 15–500)
Eosinophils Relative: 2.8 %
HCT: 36.9 % — ABNORMAL LOW (ref 38.5–50.0)
Hemoglobin: 12.7 g/dL — ABNORMAL LOW (ref 13.2–17.1)
MCH: 31.9 pg (ref 27.0–33.0)
MCHC: 34.4 g/dL (ref 32.0–36.0)
MCV: 92.7 fL (ref 80.0–100.0)
MPV: 10.7 fL (ref 7.5–12.5)
Monocytes Relative: 5.2 %
Neutro Abs: 3275 {cells}/uL (ref 1500–7800)
Neutrophils Relative %: 71.2 %
Platelets: 87 Thousand/uL — ABNORMAL LOW (ref 140–400)
RBC: 3.98 Million/uL — ABNORMAL LOW (ref 4.20–5.80)
RDW: 14.5 % (ref 11.0–15.0)
Total Lymphocyte: 20.1 %
WBC: 4.6 Thousand/uL (ref 3.8–10.8)

## 2024-06-04 LAB — COMPLETE METABOLIC PANEL WITHOUT GFR
AG Ratio: 1.6 (calc) (ref 1.0–2.5)
ALT: 16 U/L (ref 9–46)
AST: 22 U/L (ref 10–35)
Albumin: 3.9 g/dL (ref 3.6–5.1)
Alkaline phosphatase (APISO): 75 U/L (ref 35–144)
BUN: 10 mg/dL (ref 7–25)
CO2: 25 mmol/L (ref 20–32)
Calcium: 8.8 mg/dL (ref 8.6–10.3)
Chloride: 108 mmol/L (ref 98–110)
Creat: 0.92 mg/dL (ref 0.70–1.28)
Globulin: 2.5 g/dL (ref 1.9–3.7)
Glucose, Bld: 110 mg/dL — ABNORMAL HIGH (ref 65–99)
Potassium: 4 mmol/L (ref 3.5–5.3)
Sodium: 140 mmol/L (ref 135–146)
Total Bilirubin: 1.3 mg/dL — ABNORMAL HIGH (ref 0.2–1.2)
Total Protein: 6.4 g/dL (ref 6.1–8.1)

## 2024-06-04 LAB — HIV-1 RNA QUANT-NO REFLEX-BLD
HIV 1 RNA Quant: NOT DETECTED {copies}/mL
HIV-1 RNA Quant, Log: NOT DETECTED {Log_copies}/mL

## 2024-06-04 LAB — LIPID PANEL
Cholesterol: 106 mg/dL (ref <200)
HDL: 30 mg/dL — ABNORMAL LOW (ref >=40)
LDL Cholesterol (Calc): 53 mg/dL
Non-HDL Cholesterol (Calc): 76 mg/dL (ref <130)
Total CHOL/HDL Ratio: 3.5 (calc) (ref <5.0)
Triglycerides: 145 mg/dL (ref <150)

## 2024-06-04 LAB — RPR: RPR Ser Ql: NONREACTIVE

## 2024-06-16 ENCOUNTER — Ambulatory Visit

## 2024-06-28 ENCOUNTER — Other Ambulatory Visit: Payer: Self-pay

## 2024-06-28 DIAGNOSIS — E876 Hypokalemia: Secondary | ICD-10-CM

## 2024-06-30 ENCOUNTER — Other Ambulatory Visit: Payer: Self-pay | Admitting: Infectious Disease

## 2024-07-26 ENCOUNTER — Other Ambulatory Visit: Payer: Self-pay

## 2024-07-26 DIAGNOSIS — G4700 Insomnia, unspecified: Secondary | ICD-10-CM

## 2024-07-26 DIAGNOSIS — I10 Essential (primary) hypertension: Secondary | ICD-10-CM

## 2024-07-26 DIAGNOSIS — K219 Gastro-esophageal reflux disease without esophagitis: Secondary | ICD-10-CM

## 2024-07-26 NOTE — Progress Notes (Unsigned)
 Subjective:  Chief complaint: follow-up for HIV disease on medications   Patient ID: Noah Henderson, male    DOB: [birth year], 73 y.o.   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  Family History  Problem Relation Age of Onset   Heart disease Mother    Breast cancer Mother    Heart disease Father    Mental illness Father    Heart attack Father    Heart disease Brother    Colon cancer Neg Hx    Prostate cancer Neg Hx       Social History   Socioeconomic History   Marital status: Single    Spouse name: Not on file   Number of children: Not on file   Years of education: Not on file   Highest education level: Associate degree: academic program  Occupational History   Not on file  Tobacco Use   Smoking status: Never    Passive exposure: Never   Smokeless tobacco: Never  Substance and Sexual Activity   Alcohol use: No   Drug use: No   Sexual activity: Yes    Partners: Male    Birth control/protection: Condom    Comment: accepted condoms  Other Topics Concern   Not on file  Social History Narrative   Lives with a partner. Patient is retired.    Social Drivers of  Corporate Investment Banker Strain: Low Risk  (12/11/2023)   Overall Financial Resource Strain (CARDIA)    Difficulty of Paying Living Expenses: Not very hard  Food Insecurity: No Food Insecurity (12/11/2023)   Hunger Vital Sign    Worried About Running Out of Food in the Last Year: Never true    Ran Out of Food in the Last Year: Never true  Transportation Needs: No Transportation Needs (12/11/2023)   PRAPARE - Administrator, Civil Service (Medical): No    Lack of Transportation (Non-Medical): No  Physical Activity: Insufficiently Active (12/11/2023)   Exercise Vital Sign    Days of Exercise per Week: 2 days    Minutes of Exercise per Session: 10 min  Stress: No Stress Concern Present (12/11/2023)   [employer] of Occupational Health - Occupational Stress Questionnaire    Feeling of Stress : Not at all  Social Connections: Socially Isolated (12/11/2023)   Social Connection and Isolation Panel    Frequency of Communication with Friends and Family: Once a week    Frequency of Social Gatherings with Friends and Family: Once a week    Attends Religious Services: 1 to 4 times per year    Active Member of Clubs or Organizations: No  Attends Banker Meetings: Never    Marital Status: Never married    Allergies  Allergen Reactions   Penicillins      Current Outpatient Medications:    amitriptyline  (ELAVIL ) 50 MG tablet, TAKE 1 TABLET(50 MG) BY MOUTH AT BEDTIME, Disp: 30 tablet, Rfl: 1   atorvastatin  (LIPITOR) 40 MG tablet, Take 1 tablet (40 mg total) by mouth daily., Disp: 30 tablet, Rfl: 11   CREON  24000-76000 units CPEP, TAKE 2 CAPSULES(48,000 UNITS) BY MOUTH THREE TIMES DAILY, Disp: 200 capsule, Rfl: 3   Darunavir -Cobicistat -Emtricitabine -Tenofovir  Alafenamide (SYMTUZA ) 800-150-200-10 MG TABS, Take 1 tablet by mouth daily with breakfast., Disp: 30 tablet, Rfl: 11   Ensure (ENSURE), Take 1 Can by mouth 2 (two) times daily between meals for 30 doses. For  aids wasting syndrome crhonic pancreatitis, Disp: 7110 mL, Rfl: 5   furosemide  (LASIX ) 20 MG tablet, TAKE 1 TABLET(20 MG) BY MOUTH DAILY AS NEEDED FOR SWELLING OR LOWER EXTREMITY SWELLING, Disp: 30 tablet, Rfl: 3   loratadine  (CLARITIN ) 10 MG tablet, Take 1 tablet (10 mg total) by mouth daily., Disp: 30 tablet, Rfl: 11   metoprolol  succinate (TOPROL -XL) 100 MG 24 hr tablet, TAKE 1 TABLET BY MOUTH DAILY WITH OR IMMEDIATELY FOLLOWING A MEAL, Disp: 30 tablet, Rfl: 1   omeprazole  (PRILOSEC) 40 MG capsule, TAKE 1 CAPSULE BY MOUTH EVERY DAY, Disp: 30 capsule, Rfl: 1   potassium chloride  (KLOR-CON  M) 10 MEQ tablet, TAKE 1 TABLET(10 MEQ) BY MOUTH DAILY, Disp: 30 tablet, Rfl: 2   sildenafil  (VIAGRA ) 25 MG tablet, Take 1 tablet (25 mg total) by mouth daily as needed for erectile dysfunction., Disp: 10 tablet, Rfl: 5   Review of Systems     Objective:   Physical Exam        Assessment & Plan:

## 2024-07-27 ENCOUNTER — Other Ambulatory Visit: Payer: Self-pay

## 2024-07-27 ENCOUNTER — Ambulatory Visit (INDEPENDENT_AMBULATORY_CARE_PROVIDER_SITE_OTHER): Payer: Self-pay | Admitting: Infectious Disease

## 2024-07-27 ENCOUNTER — Encounter: Payer: Self-pay | Admitting: Infectious Disease

## 2024-07-27 VITALS — BP 150/77 | HR 59 | Temp 98.3°F | Ht 68.0 in | Wt 149.0 lb

## 2024-07-27 DIAGNOSIS — K8689 Other specified diseases of pancreas: Secondary | ICD-10-CM | POA: Diagnosis not present

## 2024-07-27 DIAGNOSIS — R636 Underweight: Secondary | ICD-10-CM

## 2024-07-27 DIAGNOSIS — E785 Hyperlipidemia, unspecified: Secondary | ICD-10-CM | POA: Diagnosis not present

## 2024-07-27 DIAGNOSIS — I10 Essential (primary) hypertension: Secondary | ICD-10-CM | POA: Diagnosis not present

## 2024-07-27 DIAGNOSIS — B20 Human immunodeficiency virus [HIV] disease: Secondary | ICD-10-CM | POA: Diagnosis not present

## 2024-07-27 DIAGNOSIS — Z7185 Encounter for immunization safety counseling: Secondary | ICD-10-CM

## 2024-07-27 DIAGNOSIS — Z113 Encounter for screening for infections with a predominantly sexual mode of transmission: Secondary | ICD-10-CM

## 2024-07-27 MED ORDER — BIKTARVY 50-200-25 MG PO TABS: 1.0000 | ORAL_TABLET | Freq: Every day | ORAL | 11 refills | Status: DC

## 2024-08-29 ENCOUNTER — Other Ambulatory Visit: Payer: Self-pay

## 2024-08-29 DIAGNOSIS — M25473 Effusion, unspecified ankle: Secondary | ICD-10-CM

## 2024-08-30 ENCOUNTER — Ambulatory Visit

## 2024-08-31 ENCOUNTER — Other Ambulatory Visit: Payer: Self-pay

## 2024-08-31 DIAGNOSIS — K8689 Other specified diseases of pancreas: Secondary | ICD-10-CM

## 2024-09-06 ENCOUNTER — Other Ambulatory Visit: Payer: Self-pay

## 2024-09-06 ENCOUNTER — Ambulatory Visit: Payer: Self-pay | Admitting: Infectious Disease

## 2024-09-06 ENCOUNTER — Encounter: Payer: Self-pay | Admitting: Infectious Disease

## 2024-09-06 VITALS — BP 129/72 | HR 65 | Temp 98.2°F | Ht 68.0 in | Wt 146.0 lb

## 2024-09-06 DIAGNOSIS — G4700 Insomnia, unspecified: Secondary | ICD-10-CM

## 2024-09-06 DIAGNOSIS — B20 Human immunodeficiency virus [HIV] disease: Secondary | ICD-10-CM

## 2024-09-06 DIAGNOSIS — Z7185 Encounter for immunization safety counseling: Secondary | ICD-10-CM

## 2024-09-06 DIAGNOSIS — E785 Hyperlipidemia, unspecified: Secondary | ICD-10-CM

## 2024-09-06 DIAGNOSIS — K8689 Other specified diseases of pancreas: Secondary | ICD-10-CM

## 2024-09-06 DIAGNOSIS — I10 Essential (primary) hypertension: Secondary | ICD-10-CM

## 2024-09-06 DIAGNOSIS — K219 Gastro-esophageal reflux disease without esophagitis: Secondary | ICD-10-CM

## 2024-09-06 MED ORDER — BIKTARVY 50-200-25 MG PO TABS: 1.0000 | ORAL_TABLET | Freq: Every day | ORAL | 11 refills | Status: AC

## 2024-09-06 MED ORDER — ATORVASTATIN CALCIUM 40 MG PO TABS: 40.0000 mg | ORAL_TABLET | Freq: Every day | ORAL | 11 refills | Status: AC

## 2024-09-06 NOTE — Progress Notes (Signed)
 Subjective:  Chief complaint: follow-up for HIV disease on medications   Patient ID: Noah Henderson, male    DOB: [birth year], 73 y.o.   MRN: [id number]  HPI  Discussed the use of AI scribe software for clinical note transcription with the patient, who gave verbal consent to proceed.  History of Present Illness   Noah Henderson is a 73 year old male with HIV who presents for routine follow-up and lab check.  He reports that he is currently taking Biktarvy  for HIV, having previously been on Symtuza .  He continues to take Creon  for pancreatic insufficiency, Lipitor for cholesterol management, amitriptyline  at bedtime for sleep, furosemide  for leg swelling, metoprolol  for blood pressure, omeprazole  as a proton pump inhibitor, potassium supplements, and Viagra .  He received a flu vaccination this fall but has opted not to receive the COVID-19 vaccination at this time.       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  Family History  Problem Relation Age of Onset   Heart disease Mother    Breast cancer Mother    Heart disease Father    Mental illness Father    Heart attack Father    Heart disease  Brother    Colon cancer Neg Hx    Prostate cancer Neg Hx       Social History   Socioeconomic History   Marital status: Single    Spouse name: Not on file   Number of children: Not on file   Years of education: Not on file   Highest education level: Associate degree: academic program  Occupational History   Not on file  Tobacco Use   Smoking status: Never    Passive exposure: Never   Smokeless tobacco: Never  Substance and Sexual Activity   Alcohol use: No   Drug use: No   Sexual activity: Yes    Partners: Male    Birth control/protection: Condom    Comment: decline condoms  Other Topics Concern   Not on file  Social History Narrative   Lives with a partner. Patient is retired.    Social Drivers of Health   Tobacco Use: Low Risk (09/06/2024)   Patient History    Smoking Tobacco Use: Never    Smokeless Tobacco Use: Never    Passive Exposure: Never  Financial Resource Strain: Low Risk (12/11/2023)   Overall Financial Resource Strain (CARDIA)    Difficulty of Paying Living Expenses: Not very hard  Food Insecurity: No Food Insecurity (12/11/2023)   Hunger Vital Sign    Worried About Running Out of Food in the Last Year: Never true    Ran Out of Food in  the Last Year: Never true  Transportation Needs: No Transportation Needs (12/11/2023)   PRAPARE - Administrator, Civil Service (Medical): No    Lack of Transportation (Non-Medical): No  Physical Activity: Insufficiently Active (12/11/2023)   Exercise Vital Sign    Days of Exercise per Week: 2 days    Minutes of Exercise per Session: 10 min  Stress: No Stress Concern Present (12/11/2023)   [employer] of Occupational Health - Occupational Stress Questionnaire    Feeling of Stress : Not at all  Social Connections: Socially Isolated (12/11/2023)   Social Connection and Isolation Panel    Frequency of Communication with Friends and Family: Once a week    Frequency of Social Gatherings with Friends and  Family: Once a week    Attends Religious Services: 1 to 4 times per year    Active Member of Clubs or Organizations: No    Attends Banker Meetings: Never    Marital Status: Never married  Depression (PHQ2-9): Low Risk (07/27/2024)   Depression (PHQ2-9)    PHQ-2 Score: 0  Alcohol Screen: Low Risk (12/11/2023)   Alcohol Screen    Last Alcohol Screening Score (AUDIT): 1  Housing: Unknown (12/11/2023)   Housing Stability Vital Sign    Unable to Pay for Housing in the Last Year: No    Number of Times Moved in the Last Year: Not on file    Homeless in the Last Year: No  Utilities: Not At Risk (03/18/2023)   [facility] Utilities    Threatened with loss of utilities: No  Health Literacy: Not on file    Allergies[1]  Current Medications[2]   Review of Systems  Constitutional:  Negative for activity change, appetite change, chills, diaphoresis, fatigue, fever and unexpected weight change.  HENT:  Negative for congestion, rhinorrhea, sinus pressure, sneezing, sore throat and trouble swallowing.   Eyes:  Negative for photophobia and visual disturbance.  Respiratory:  Negative for cough, chest tightness, shortness of breath, wheezing and stridor.   Cardiovascular:  Negative for chest pain, palpitations and leg swelling.  Gastrointestinal:  Negative for abdominal distention, abdominal pain, anal bleeding, blood in stool, constipation, diarrhea, nausea and vomiting.  Genitourinary:  Negative for difficulty urinating, dysuria, flank pain and hematuria.  Musculoskeletal:  Negative for arthralgias, back pain, gait problem, joint swelling and myalgias.  Skin:  Negative for color change, pallor, rash and wound.  Neurological:  Negative for dizziness, tremors, weakness and light-headedness.  Hematological:  Negative for adenopathy. Does not bruise/bleed easily.  Psychiatric/Behavioral:  Negative for agitation, behavioral problems, confusion, decreased concentration, dysphoric mood and sleep  disturbance.        Objective:   Physical Exam Constitutional:      Appearance: He is well-developed.  HENT:     Head: Normocephalic and atraumatic.  Eyes:     Conjunctiva/sclera: Conjunctivae normal.  Cardiovascular:     Rate and Rhythm: Normal rate and regular rhythm.  Pulmonary:     Effort: Pulmonary effort is normal. No respiratory distress.     Breath sounds: No wheezing.  Abdominal:     General: There is no distension.     Palpations: Abdomen is soft.  Musculoskeletal:        General: No tenderness. Normal range of motion.     Cervical back: Normal range of motion and neck supple.  Skin:    General: Skin is warm and dry.     Coloration: Skin is not pale.     Findings:  No erythema or rash.  Neurological:     General: No focal deficit present.     Mental Status: He is alert and oriented to person, place, and time.  Psychiatric:        Mood and Affect: Mood normal.        Behavior: Behavior normal.        Thought Content: Thought content normal.        Judgment: Judgment normal.           Assessment & Plan:   Assessment and Plan    Human immunodeficiency virus (HIV) disease Currently on Biktarvy .  - Checked viral load and CD4 count today. - Continue Biktarvy .  Pancreatic insufficiency Managed with Creon . - Continue Creon .  Vaccine counseling Received flu vaccine. Declined COVID-19 vaccine.   Hyperlipidemia: continue lipitor   HTN: continue metoprolol ,    GERD: continue omeprazole    Insominia; continue elavil           [1]  Allergies Allergen Reactions   Penicillins   [2]  Current Outpatient Medications:    amitriptyline  (ELAVIL ) 50 MG tablet, TAKE 1 TABLET(50 MG) BY MOUTH AT BEDTIME, Disp: 30 tablet, Rfl: 1   atorvastatin  (LIPITOR) 40 MG tablet, Take 1 tablet (40 mg total) by mouth daily., Disp: 30 tablet, Rfl: 11   bictegravir-emtricitabine -tenofovir  AF (BIKTARVY ) 50-200-25 MG TABS tablet, Take 1 tablet by mouth daily., Disp: 30  tablet, Rfl: 11   CREON  24000-76000 units CPEP, TAKE 2 CAPSULES(48,000 UNITS) BY MOUTH THREE TIMES DAILY. MAX 6 CAPSULES PER DAY., Disp: 200 capsule, Rfl: 3   Ensure (ENSURE), Take 1 Can by mouth 2 (two) times daily between meals for 30 doses. For aids wasting syndrome crhonic pancreatitis, Disp: 7110 mL, Rfl: 5   furosemide  (LASIX ) 20 MG tablet, TAKE 1 TABLET(20 MG) BY MOUTH DAILY AS NEEDED FOR SWELLING OR LOWER EXTREMITY SWELLING, Disp: 30 tablet, Rfl: 3   loratadine  (CLARITIN ) 10 MG tablet, Take 1 tablet (10 mg total) by mouth daily., Disp: 30 tablet, Rfl: 11   metoprolol  succinate (TOPROL -XL) 100 MG 24 hr tablet, TAKE 1 TABLET BY MOUTH DAILY WITH OR IMMEDIATELY FOLLOWING A MEAL, Disp: 30 tablet, Rfl: 1   omeprazole  (PRILOSEC) 40 MG capsule, TAKE 1 CAPSULE BY MOUTH EVERY DAY, Disp: 30 capsule, Rfl: 1   potassium chloride  (KLOR-CON  M) 10 MEQ tablet, TAKE 1 TABLET(10 MEQ) BY MOUTH DAILY, Disp: 30 tablet, Rfl: 2   sildenafil  (VIAGRA ) 25 MG tablet, Take 1 tablet (25 mg total) by mouth daily as needed for erectile dysfunction., Disp: 10 tablet, Rfl: 5

## 2024-09-07 LAB — T-HELPER CELLS (CD4) COUNT (NOT AT ARMC)
CD4 % Helper T Cell: 38 % (ref 33–65)
CD4 T Cell Abs: 270 /uL — ABNORMAL LOW (ref 400–1790)

## 2024-09-08 LAB — HIV-1 RNA QUANT-NO REFLEX-BLD
HIV 1 RNA Quant: NOT DETECTED {copies}/mL
HIV-1 RNA Quant, Log: NOT DETECTED {Log_copies}/mL

## 2024-09-29 ENCOUNTER — Other Ambulatory Visit: Payer: Self-pay

## 2024-09-29 DIAGNOSIS — K219 Gastro-esophageal reflux disease without esophagitis: Secondary | ICD-10-CM

## 2024-09-29 DIAGNOSIS — G4700 Insomnia, unspecified: Secondary | ICD-10-CM

## 2024-09-29 DIAGNOSIS — I10 Essential (primary) hypertension: Secondary | ICD-10-CM

## 2024-09-29 DIAGNOSIS — E876 Hypokalemia: Secondary | ICD-10-CM

## 2024-10-12 ENCOUNTER — Other Ambulatory Visit (HOSPITAL_BASED_OUTPATIENT_CLINIC_OR_DEPARTMENT_OTHER): Payer: Self-pay

## 2025-01-25 ENCOUNTER — Ambulatory Visit

## 2025-03-08 ENCOUNTER — Ambulatory Visit: Payer: Self-pay | Admitting: Infectious Disease
